# Patient Record
Sex: Female | Born: 1960 | Race: White | Hispanic: No | Marital: Single | Smoking: Former smoker
Health system: Southern US, Community
[De-identification: ages and names within clinical notes are randomized; demographics above are authoritative.]

## PROBLEM LIST (undated history)

## (undated) DIAGNOSIS — I739 Peripheral vascular disease, unspecified: Secondary | ICD-10-CM

## (undated) DIAGNOSIS — E119 Type 2 diabetes mellitus without complications: Secondary | ICD-10-CM

## (undated) DIAGNOSIS — I10 Essential (primary) hypertension: Secondary | ICD-10-CM

## (undated) HISTORY — PX: EYE SURGERY: SHX253

## (undated) HISTORY — DX: Peripheral vascular disease, unspecified: I73.9

## (undated) HISTORY — DX: Type 2 diabetes mellitus without complications: E11.9

## (undated) HISTORY — PX: ACHILLES TENDON REPAIR: SUR1153

---

## 2017-09-10 ENCOUNTER — Encounter: Payer: Self-pay | Admitting: Surgery

## 2017-10-08 ENCOUNTER — Ambulatory Visit (INDEPENDENT_AMBULATORY_CARE_PROVIDER_SITE_OTHER): Payer: No Typology Code available for payment source | Admitting: Vascular Surgery

## 2017-10-08 ENCOUNTER — Other Ambulatory Visit: Payer: Self-pay | Admitting: *Deleted

## 2017-10-08 ENCOUNTER — Encounter: Payer: Self-pay | Admitting: Vascular Surgery

## 2017-10-08 ENCOUNTER — Other Ambulatory Visit: Payer: Self-pay

## 2017-10-08 VITALS — BP 106/69 | HR 73 | Temp 97.6°F | Ht 65.0 in | Wt 221.4 lb

## 2017-10-08 DIAGNOSIS — I70219 Atherosclerosis of native arteries of extremities with intermittent claudication, unspecified extremity: Secondary | ICD-10-CM | POA: Insufficient documentation

## 2017-10-08 DIAGNOSIS — I83893 Varicose veins of bilateral lower extremities with other complications: Secondary | ICD-10-CM

## 2017-10-08 DIAGNOSIS — I872 Venous insufficiency (chronic) (peripheral): Secondary | ICD-10-CM

## 2017-10-08 NOTE — Progress Notes (Signed)
Requested by:  Kirstie PeriShah, Ashish, MD [address] [place], [place] [id number]  Reason for consultation: bilateral leg pain   History of Present Illness   Christina ObeyKathy Lamb is a 57 y.o. (04/20/1961) female who presents with chief complaint: bilateral leg pain.  Onset of symptom occurred possibly >1 year ago but worsening over the last few minutes.  Pain is described as "aching", severity 3-6/10, and associated with extended standing.  Patient has attempted to treat this pain with rest.  The patient has no rest pain symptoms also and no leg wounds/ulcers.  The patient has a family history of varicose veins.  She has never been pregnant or had lymphedema or a DVT.  She denies any venous ulceration in the past.  She does note dropping a can on the left shin recently.  Atherosclerotic risk factors include: IDDM, HTN, prior smoking  Past Medical History: IDDM Major depression Reactive airway HTN  Past Surgical History: none per patient    Social History   Socioeconomic History  . Marital status: Single    Spouse name: Not on file  . Number of children: Not on file  . Years of education: Not on file  . Highest education level: Not on file  Occupational History  . Not on file  Social Needs  . Financial resource strain: Not on file  . Food insecurity:    Worry: Not on file    Inability: Not on file  . Transportation needs:    Medical: Not on file    Non-medical: Not on file  Tobacco Use  . Smoking status: Former Smoker    Packs/day: 0.50    Years: 15.00    Pack years: 7.50    Types: Cigarettes    Last attempt to quit: 2018    Years since quitting: 1.4  . Smokeless tobacco: Never Used  Substance and Sexual Activity  . Alcohol use: Never    Frequency: Never  . Drug use: Not on file  . Sexual activity: Not on file  Lifestyle  . Physical activity:    Days per week: Not on file    Minutes per session: Not on file  . Stress: Not on file  Relationships  . Social connections:    Talks on  phone: Not on file    Gets together: Not on file    Attends religious service: Not on file    Active member of club or organization: Not on file    Attends meetings of clubs or organizations: Not on file    Relationship status: Not on file  . Intimate partner violence:    Fear of current or ex partner: Not on file    Emotionally abused: Not on file    Physically abused: Not on file    Forced sexual activity: Not on file  Other Topics Concern  . Not on file  Social History Narrative  . Not on file   Family History: patient is unable to detail the medical history of his parents   Current Outpatient Medications  Medication Sig Dispense Refill  . aspirin EC 81 MG tablet Take 81 mg by mouth daily.    Marland Kitchen. glimepiride (AMARYL) 4 MG tablet Take 1 tablet by mouth daily.    . INVOKAMET [phone] MG TABS Take 1 tablet by mouth 2 (two) times daily.  4  . lisinopril (PRINIVIL,ZESTRIL) 20 MG tablet Take 20 mg by mouth daily.     Marland Kitchen. omega-3 acid ethyl esters (LOVAZA) 1 g capsule  Take by mouth 2 (two) times daily.    Marland Kitchen albuterol (PROVENTIL HFA;VENTOLIN HFA) 108 (90 Base) MCG/ACT inhaler Inhale into the lungs every 6 (six) hours as needed for wheezing or shortness of breath.     No current facility-administered medications for this visit.     Allergies  Allergen Reactions  . Hydromet [Hydrocodone-Homatropine]   . Penicillins Swelling  . Codeine Nausea And Vomiting    REVIEW OF SYSTEMS (negative unless checked):   Cardiac:  []  Chest pain or chest pressure? []  Shortness of breath upon activity? []  Shortness of breath when lying flat? []  Irregular heart rhythm?  Vascular:  [x]  Pain in calf, thigh, or hip brought on by walking? [x]  Pain in feet at night that wakes you up from your sleep? []  Blood clot in your veins? [x]  Leg swelling?  Pulmonary:  []  Oxygen at home? []  Productive cough? []  Wheezing?  Neurologic:  []  Sudden weakness in arms or legs? []  Sudden numbness in arms or  legs? []  Sudden onset of difficult speaking or slurred speech? []  Temporary loss of vision in one eye? []  Problems with dizziness?  Gastrointestinal:  []  Blood in stool? []  Vomited blood?  Genitourinary:  []  Burning when urinating? []  Blood in urine?  Psychiatric:  []  Major depression  Hematologic:  []  Bleeding problems? []  Problems with blood clotting?  Dermatologic:  []  Rashes or ulcers?  Constitutional:  []  Fever or chills?  Ear/Nose/Throat:  []  Change in hearing? []  Nose bleeds? []  Sore throat?  Musculoskeletal:  []  Back pain? []  Joint pain? []  Muscle pain?   For VQI Use Only   PRE-ADM LIVING Home  AMB STATUS Ambulatory  CAD Sx None  PRIOR CHF None  STRESS TEST No   Physical Examination     Vitals:   10/08/17 0917 10/08/17 0920  BP: 104/62 106/69  Pulse: 73   Temp: 97.6 F (36.4 C)   TempSrc: Oral   SpO2: 98%   Weight: 221 lb 6.4 oz (100.4 kg)   Height: 5\' 5"  (1.651 m)    Body mass index is 36.84 kg/m.  General Alert, O x 3, WD, NAD  Head [place]/AT,    Ear/Nose/ Throat Hearing grossly intact, nares without erythema or drainage, oropharynx without Erythema or Exudate, Mallampati score: 3,   Eyes PERRLA, EOMI,    Neck Supple, mid-line trachea,    Pulmonary Sym exp, good B air movt, CTA B  Cardiac RRR, Nl S1, S2, no Murmurs, No rubs, No S3,S4  Vascular Vessel Right Left  Radial Palpable Palpable  Brachial Palpable Palpable  Carotid Palpable, No Bruit Palpable, No Bruit  Aorta Not palpable N/A  Femoral Palpable Palpable  Popliteal Not palpable Not palpable  PT Not palpable Not palpable  DP Not palpable Not palpable    Gastro- intestinal soft, non-distended, non-tender to palpation, No guarding or rebound, no HSM, no masses, no CVAT B, No palpable prominent aortic pulse,    Musculo- skeletal M/S 5/5 throughout  , Extremities without ischemic changes  , Non-pitting edema present: B 1+, circumferential L calf edema, Varicosities present:  R>L, nest of varicosities palpable posterior calf, Lipodermatosclerosis present: L>R  Neurologic Cranial nerves 2-12 intact , Pain and light touch intact in extremities , Motor exam as listed above  Psychiatric Judgement intact, Mood & affect appropriate for pt's clinical situation  Dermatologic See M/S exam for extremity exam, No rashes otherwise noted  Lymphatic  Palpable lymph nodes: None    Non-Invasive Vascular imaging   Outside arterial  duplex review: inadequate study, will need repeat studies   Outside Studies/Documentation   5 pages of outside documents were reviewed including: outpatient PCP chart, outside arterial duplex.   Medical Decision Making   Christina Lamb is a 57 y.o. female who presents with: likely moderate RLE PAD, likely moderate LLE PAD, CVI (C4), varicose veins with pain   This patient's sx are more consistent with CVI than PAD as she does not have classic intermittent claudication, though I suspect her ABI will fall into the range for such.  Her outside studies is inadequate for evaluating her PAD.  Based on this patient's history and physical exam, I recommend: BLE ABI, B venous reflux duplex  She will follow up with [place] after her studies are scheduled.  I discussed with the patient the natural history of intermittent claudication: 75% of patients have stable or improved symptoms in a year an only 2% require amputation. Eventually 20% may require intervention in a year.  I discussed in depth with the patient the nature of atherosclerosis, and emphasized the importance of maximal medical management including strict control of blood pressure, blood glucose, and lipid levels, antiplatelet agent, obtaining regular exercise, and cessation of smoking.    The patient is aware that without maximal medical management the underlying atherosclerotic disease process will progress, limiting the benefit of any interventions.  I discussed in depth with the patient a  walking plan and how to execute such.  The patient is currently not on on statin as not medically indicated.   The patient is currently on an anti-platelet: ASA.  Thank you for allowing [place] to participate in this patient's care.   Leonides Sake, MD, FACS [facility] Office: [phone] Pager: [phone]  10/08/2017, 9:43 AM

## 2017-10-10 ENCOUNTER — Encounter: Payer: Self-pay | Admitting: Internal Medicine

## 2017-12-16 ENCOUNTER — Encounter (HOSPITAL_COMMUNITY): Payer: No Typology Code available for payment source

## 2017-12-16 ENCOUNTER — Ambulatory Visit (HOSPITAL_COMMUNITY)
Admission: RE | Admit: 2017-12-16 | Discharge: 2017-12-16 | Disposition: A | Discharge status: Home / Self Care | Payer: PRIVATE HEALTH INSURANCE | Source: Ambulatory Visit | Attending: Vascular Surgery | Admitting: Vascular Surgery

## 2017-12-16 DIAGNOSIS — I781 Nevus, non-neoplastic: Secondary | ICD-10-CM | POA: Insufficient documentation

## 2017-12-16 DIAGNOSIS — I872 Venous insufficiency (chronic) (peripheral): Secondary | ICD-10-CM | POA: Diagnosis present

## 2017-12-16 DIAGNOSIS — I70219 Atherosclerosis of native arteries of extremities with intermittent claudication, unspecified extremity: Secondary | ICD-10-CM

## 2017-12-17 ENCOUNTER — Encounter (HOSPITAL_COMMUNITY): Payer: No Typology Code available for payment source

## 2017-12-17 ENCOUNTER — Ambulatory Visit: Payer: No Typology Code available for payment source | Admitting: Vascular Surgery

## 2017-12-24 ENCOUNTER — Ambulatory Visit: Payer: No Typology Code available for payment source

## 2017-12-24 ENCOUNTER — Encounter (HOSPITAL_COMMUNITY): Payer: No Typology Code available for payment source

## 2017-12-26 ENCOUNTER — Encounter (HOSPITAL_COMMUNITY): Payer: No Typology Code available for payment source

## 2017-12-26 ENCOUNTER — Ambulatory Visit: Payer: No Typology Code available for payment source

## 2018-01-02 ENCOUNTER — Ambulatory Visit: Payer: No Typology Code available for payment source | Admitting: Family

## 2018-01-02 ENCOUNTER — Ambulatory Visit (HOSPITAL_COMMUNITY): Payer: No Typology Code available for payment source

## 2018-01-27 ENCOUNTER — Encounter: Payer: Self-pay | Admitting: Family

## 2018-01-27 ENCOUNTER — Ambulatory Visit (INDEPENDENT_AMBULATORY_CARE_PROVIDER_SITE_OTHER): Payer: No Typology Code available for payment source | Admitting: Family

## 2018-01-27 ENCOUNTER — Ambulatory Visit (HOSPITAL_COMMUNITY)
Admission: RE | Admit: 2018-01-27 | Discharge: 2018-01-27 | Disposition: A | Discharge status: Home / Self Care | Payer: No Typology Code available for payment source | Source: Ambulatory Visit | Attending: Family | Admitting: Family

## 2018-01-27 ENCOUNTER — Other Ambulatory Visit: Payer: Self-pay

## 2018-01-27 VITALS — BP 108/69 | HR 76 | Temp 97.7°F | Resp 16 | Ht 63.0 in | Wt 215.0 lb

## 2018-01-27 DIAGNOSIS — I70219 Atherosclerosis of native arteries of extremities with intermittent claudication, unspecified extremity: Secondary | ICD-10-CM | POA: Diagnosis not present

## 2018-01-27 DIAGNOSIS — I779 Disorder of arteries and arterioles, unspecified: Secondary | ICD-10-CM | POA: Diagnosis not present

## 2018-01-27 NOTE — Progress Notes (Signed)
[facility]    [facility]: Follow up peripheral artery occlusive disease  History of Present Illness Christina Lamb is a 57 y.o. female who was referred to Dr. Imogene Burn by Dr. Kirstie Peri for bilateral leg pain.   Dr. Nicky Pugh initial evaluation was on 10-08-17. Onset of symptom occurred possibly before June of 2018, but worsening.  Pain was described as "aching", severity 3-6/10, and associated with extended standing.  Patient has attempted to treat this pain with rest.  The patient has no rest pain symptoms also and no leg wounds/ulcers.  The patient has a family history of varicose veins.  She has never been pregnant or had lymphedema or a DVT.  She denies any venous ulceration in the past.  She does note dropping a can on the left shin recently. Dr. Nicky Pugh assessment and recommendations were as follows: likely moderate RLE PAD, likely moderate LLE PAD, CVI (C4), varicose veins with pain. This patient's sx are more consistent with CVI than PAD as she does not have classic intermittent claudication, though Dr. Imogene Burn suspected her ABI will fall into the range for such.  Her outside studies were inadequate for evaluating of PAD.  Based on this patient's history and physical exam, Dr. Imogene Burn recommend: BLE ABI, B venous reflux duplex.  Pt returns today for follow up.  Atherosclerotic risk factors include: NIDDM, HTN, prior smoking  Pt states that about 2012 a 2 liter bottle plastic bottle fell on her left lower leg, and she has had swelling in this lower leg since then, but the swelling decreases by morning.   She denies any known history of stroke or TIA.  She denies any known cardiac problems.   She walks at least 6 hours/day, works for [employer] [place].  Pt denies any pain or weakness in her right leg with walking, her left lower leg feels tired after about 2 hours of walking.  Pt states her left lower leg has looked the same since she injured it in 2012. She had an  ulcer at the lateral base on her right 5th metatarsal which has healed, she continues to protect this with a bandage and wearing a post op shoe.     Diabetic: Yes, no lab results on file, pt states her last A1C was 5.8; pt states she has lost 65 pounds with walking and diet Tobacco use: former smoker, quit in 2018, smoked x 15 years  Pt meds include: Statin :No, pt states her cholesterol is ok Betablocker: No ASA: Yes Other anticoagulants/antiplatelets: no  Past Medical History:  Diagnosis Date  . Diabetes mellitus without complication [facility])     Social History Social History   Tobacco Use  . Smoking status: Former Smoker    Packs/day: 0.50    Years: 15.00    Pack years: 7.50    Types: Cigarettes    Last attempt to quit: 2018    Years since quitting: 1.7  . Smokeless tobacco: Never Used  Substance Use Topics  . Alcohol use: Never    Frequency: Never  . Drug use: Not on file    Family History History reviewed. No pertinent family history.  History reviewed. No pertinent surgical history.  Allergies  Allergen Reactions  . Hydromet [Hydrocodone-Homatropine]   . Penicillins Swelling  . Codeine Nausea And Vomiting    Current Outpatient Medications  Medication Sig Dispense Refill  . albuterol (PROVENTIL HFA;VENTOLIN HFA) 108 (90 Base) MCG/ACT inhaler Inhale into the lungs every 6 (six) hours  as needed for wheezing or shortness of breath.    Marland Kitchen. aspirin EC 81 MG tablet Take 81 mg by mouth daily.    Marland Kitchen. glimepiride (AMARYL) 4 MG tablet Take 1 tablet by mouth daily.    . INVOKAMET [phone] MG TABS Take 1 tablet by mouth 2 (two) times daily.  4  . lisinopril (PRINIVIL,ZESTRIL) 20 MG tablet Take 20 mg by mouth daily.     Marland Kitchen. omega-3 acid ethyl esters (LOVAZA) 1 g capsule Take by mouth 2 (two) times daily.     No current facility-administered medications for this visit.     ROS: See HPI for pertinent positives and negatives.   Physical Examination  Vitals:   01/27/18  0851  BP: 108/69  Pulse: 76  Resp: 16  Temp: 97.7 F (36.5 C)  TempSrc: Oral  SpO2: 97%  Weight: 215 lb (97.5 kg)  Height: 5\' 3"  (1.6 m)   Body mass index is 38.09 kg/m.  General: A&O x 3, WDWN, obese female. Gait: normal HENT: No gross abnormalities.  Eyes: PERRLA. Pulmonary: Respirations are non labored, CTAB, good air movement in all fields Cardiac: regular rhythm, no detected murmur.         Carotid Bruits Right Left   Negative Negative   Radial pulses are 1+ right, 2+ left palpable   Adominal aortic pulse is not palpable                         VASCULAR EXAM: Extremities with ischemic changes: right toe tips are cooler than left and slightly pale, without Gangrene; without open wounds. Bilateral anterior lower legs with mild erythema (left more so than right); left lower leg with 1+ non pitting edema.                                                                                                           LE Pulses Right Left       FEMORAL  1+ palpable  1+ palpable        POPLITEAL  not palpable   not palpable       POSTERIOR TIBIAL  not palpable   faintly palpable        DORSALIS PEDIS      ANTERIOR TIBIAL not palpable  not palpable    Abdomen: soft, NT, no palpable masses. Large soft panus.  Skin: no rashes, no cellulitis, no ulcers noted. Musculoskeletal: no muscle wasting or atrophy.  Neurologic: A&O X 3; appropriate affect, Sensation is normal; MOTOR FUNCTION:  moving all extremities equally, motor strength 5/5 throughout. Speech is fluent/normal. CN 2-12 intact. Psychiatric: Thought content is normal, mood appropriate for clinical situation.     ASSESSMENT: Christina Lamb is a 57 y.o. female who has no venous reflux according to venous reflux study on 12-16-17, does have 1+ non pitting edema in her left lower leg, and has bilateral mild erythema in both lower legs in a gaiter pattern, no ulcers.  ABI's indicate moderate peripheral artery occlusive  disease in the right, mild  in the left. 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  Her right toes are cooler and pale compared to her left toes.  There are no ulcers, no gangrene.   She has lost 65 pounds with walking and diet, and her A1C decreased to 5.8 She smoked for 15 years, quit in 2018.    DATA  Bilateral Venous Duplex (12-16-17): Right Reflux Technical Findings: No evidence of DVT, SVT, or Baker's cyst. The sapheno-femoral junction is competent. No evidence of GSV reflux. No evidence of SSV reflux. No evidence of deep venous reflux. Left Reflux Technical Findings: No evidence of DVT, SVT, or Baker's cyst. The sapheno-femoral junction is competent. No evidence of GSV reflux. No evidence of SSV reflux. No evidence of deep venous reflux.  Final Interpretation: Right: No reflux was noted in the common femoral vein, femoral vein in the thigh, popliteal vein, great saphenous vein at the saphenofemoral junction, great saphenous vein at the proximal thigh, great saphenous vein at the mid thigh, great saphenous vein at the distal thigh, great saphenous vein at the knee, origin of the small saphenous vein, and proximal small saphenous vein. There is no evidence of deep vein thrombosis in the lower extremity. There is no evidence of superficial venous thrombosis. No  cystic structure found in the popliteal fossa. Left: No reflux was noted in the common femoral vein , femoral vein in the thigh, popliteal vein, great saphenous vein at the saphenofemoral junction, great saphenous vein at the proximal thigh, great saphenous vein at the mid thigh, great saphenous vein at the distal thigh, great saphenous vein at the knee, origin of the small saphenous vein, and proximal small saphenous vein. There is no evidence of deep vein thrombosis in the lower extremity. There is no evidence of superficial venous thrombosis. No  cystic  structure found in the popliteal fossa.   ABI (Date: 01/27/2018):  R:   ABI: 0.59 (no previous for comparison),   PT: mono  DP: mono  TBI:  0.32, toe pressure 35  L:   ABI: 0.87 (no previous),   PT: tri  DP: mono  TBI: unable to detect due to low amplitude Moderate disease in the right with monophasic waveforms Mild disease in the left with tri and monophasic waveforms.    PLAN:  BMI is 38: Continue extensive walking and dietary measure for continued weight loss.   Based on the patient's vascular studies and examination, pt will return to clinic in 6 months with ABI's. I advised to notify us if she develops concerns re the circulation in her feet or legs.   I discussed in depth with the patient the nature of atherosclerosis, and emphasized the importance of maximal medical management including strict control of blood pressure, blood glucose, and lipid levels, obtaining regular exercise, and continued cessation of smoking.  The patient is aware that without maximal medical management the underlying atherosclerotic disease process will progress, limiting the benefit of any interventions.  The patient was given information about PAD including signs, symptoms, treatment, what symptoms should prompt the patient to seek immediate medical care, and risk reduction measures to take.  Charisse March, RN, MSN, FNP-C [facility] Phone: [phone]  Clinic MD: Bonnye Fava  01/27/18 8:57 AM

## 2018-01-27 NOTE — Patient Instructions (Addendum)
  To decrease swelling in your feet and legs: Elevate feet above slightly bent knees, feet above heart, overnight and 3-4 times per day for 20 minutes.   To measure for knee high compression hose: Measure the length of calf (from the crease of the knee to the bottom of the heel), largest circumference of calf, and ankle circumference first thing in the morning before your legs have a chance to swell.  Take these 3 measurements with you to obtain 20-30 mm mercury graduated knee high compression hose.  Put the stockings on in the morning, remove at bedtime.     Peripheral Vascular Disease Peripheral vascular disease (PVD) is a disease of the blood vessels that are not part of your heart and brain. A simple term for PVD is poor circulation. In most cases, PVD narrows the blood vessels that carry blood from your heart to the rest of your body. This can result in a decreased supply of blood to your arms, legs, and internal organs, like your stomach or kidneys. However, it most often affects a person's lower legs and feet. There are two types of PVD.  Organic PVD. This is the more common type. It is caused by damage to the structure of blood vessels.  Functional PVD. This is caused by conditions that make blood vessels contract and tighten (spasm).  Without treatment, PVD tends to get worse over time. PVD can also lead to acute ischemic limb. This is when an arm or limb suddenly has trouble getting enough blood. This is a medical emergency. Follow these instructions at home:  Take medicines only as told by your doctor.  Do not use any tobacco products, including cigarettes, chewing tobacco, or electronic cigarettes. If you need help quitting, ask your doctor.  Lose weight if you are overweight, and maintain a healthy weight as told by your doctor.  Eat a diet that is low in fat and cholesterol. If you need help, ask your doctor.  Exercise regularly. Ask your doctor for some good activities  for you.  Take good care of your feet. ? Wear comfortable shoes that fit well. ? Check your feet often for any cuts or sores. Contact a doctor if:  You have cramps in your legs while walking.  You have leg pain when you are at rest.  You have coldness in a leg or foot.  Your skin changes.  You are unable to get or have an erection (erectile dysfunction).  You have cuts or sores on your feet that are not healing. Get help right away if:  Your arm or leg turns cold and blue.  Your arms or legs become red, warm, swollen, painful, or numb.  You have chest pain or trouble breathing.  You suddenly have weakness in your face, arm, or leg.  You become very confused or you cannot speak.  You suddenly have a very bad headache.  You suddenly cannot see. This information is not intended to replace advice given to you by your health care provider. Make sure you discuss any questions you have with your health care provider. Document Released: 07/17/2009 Document Revised: 09/28/2015 Document Reviewed: 09/30/2013 [employer]vier Interactive Patient Education  2017 [employer]  

## 2018-07-21 ENCOUNTER — Encounter (HOSPITAL_COMMUNITY): Payer: No Typology Code available for payment source

## 2018-07-21 ENCOUNTER — Ambulatory Visit: Payer: No Typology Code available for payment source | Admitting: Family

## 2020-04-13 ENCOUNTER — Other Ambulatory Visit: Payer: Self-pay | Admitting: Podiatry

## 2020-04-13 DIAGNOSIS — E1151 Type 2 diabetes mellitus with diabetic peripheral angiopathy without gangrene: Secondary | ICD-10-CM

## 2020-04-13 DIAGNOSIS — L97512 Non-pressure chronic ulcer of other part of right foot with fat layer exposed: Secondary | ICD-10-CM

## 2020-04-20 ENCOUNTER — Ambulatory Visit (HOSPITAL_COMMUNITY)
Admission: RE | Admit: 2020-04-20 | Discharge: 2020-04-20 | Disposition: A | Discharge status: Home / Self Care | Payer: No Typology Code available for payment source | Source: Ambulatory Visit | Attending: Podiatry | Admitting: Podiatry

## 2020-04-20 ENCOUNTER — Other Ambulatory Visit: Payer: Self-pay

## 2020-04-20 DIAGNOSIS — L97512 Non-pressure chronic ulcer of other part of right foot with fat layer exposed: Secondary | ICD-10-CM | POA: Diagnosis not present

## 2020-04-20 DIAGNOSIS — E1151 Type 2 diabetes mellitus with diabetic peripheral angiopathy without gangrene: Secondary | ICD-10-CM | POA: Insufficient documentation

## 2020-04-24 ENCOUNTER — Other Ambulatory Visit (HOSPITAL_COMMUNITY): Payer: Self-pay | Admitting: Podiatry

## 2020-04-24 ENCOUNTER — Other Ambulatory Visit: Payer: Self-pay

## 2020-04-24 DIAGNOSIS — M19071 Primary osteoarthritis, right ankle and foot: Secondary | ICD-10-CM

## 2020-04-24 DIAGNOSIS — I70219 Atherosclerosis of native arteries of extremities with intermittent claudication, unspecified extremity: Secondary | ICD-10-CM

## 2020-04-25 ENCOUNTER — Other Ambulatory Visit: Payer: Self-pay | Admitting: *Deleted

## 2020-04-25 ENCOUNTER — Encounter: Payer: Self-pay | Admitting: Vascular Surgery

## 2020-04-25 ENCOUNTER — Inpatient Hospital Stay (HOSPITAL_COMMUNITY): Admission: RE | Admit: 2020-04-25 | Payer: No Typology Code available for payment source | Source: Ambulatory Visit

## 2020-04-25 ENCOUNTER — Ambulatory Visit (INDEPENDENT_AMBULATORY_CARE_PROVIDER_SITE_OTHER): Payer: No Typology Code available for payment source | Admitting: Vascular Surgery

## 2020-04-25 ENCOUNTER — Other Ambulatory Visit: Payer: Self-pay

## 2020-04-25 ENCOUNTER — Ambulatory Visit (HOSPITAL_COMMUNITY)
Admission: RE | Admit: 2020-04-25 | Discharge: 2020-04-25 | Disposition: A | Discharge status: Home / Self Care | Payer: No Typology Code available for payment source | Source: Ambulatory Visit | Attending: Vascular Surgery | Admitting: Vascular Surgery

## 2020-04-25 ENCOUNTER — Encounter: Payer: Self-pay | Admitting: *Deleted

## 2020-04-25 DIAGNOSIS — I70219 Atherosclerosis of native arteries of extremities with intermittent claudication, unspecified extremity: Secondary | ICD-10-CM | POA: Diagnosis not present

## 2020-04-25 DIAGNOSIS — I739 Peripheral vascular disease, unspecified: Secondary | ICD-10-CM | POA: Diagnosis not present

## 2020-04-25 NOTE — Progress Notes (Signed)
Patient name: Christina Lamb MRN: [id number] DOB: [birth year] Sex: female  REASON FOR CONSULT: Right foot ulcer  HPI: Christina Lamb is a 59 y.o. female, with diabetes that presents for evaluation of PAD and right foot ulcer.  She has had an ulcer on the right foot for approximately 20 weeks.  She states she has been to a number of different doctors including wound clinics and this has failed to heal with multiple treatments.  She states she had wounds on her left foot that have healed immediately.  She denies any previous lower extremity revascularizations.  She used to smoke but has since quit.  She is ambulatory.  She is currently walking with a Darco shoe and states she has a lot of pain in the foot when she is walking.  Past Medical History:  Diagnosis Date  . Diabetes mellitus without complication [facility])     Past Surgical History:  Procedure Laterality Date  . ACHILLES TENDON REPAIR      Family History  Problem Relation Age of Onset  . Heart disease Father     SOCIAL HISTORY: Social History   Socioeconomic History  . Marital status: Single    Spouse name: Not on file  . Number of children: Not on file  . Years of education: Not on file  . Highest education level: Not on file  Occupational History  . Not on file  Tobacco Use  . Smoking status: Former Smoker    Packs/day: 0.50    Years: 15.00    Pack years: 7.50    Types: Cigarettes    Quit date: 2018    Years since quitting: 3.9  . Smokeless tobacco: Never Used  Substance and Sexual Activity  . Alcohol use: Never  . Drug use: Never  . Sexual activity: Not on file  Other Topics Concern  . Not on file  Social History Narrative  . Not on file   Social Determinants of Health   Financial Resource Strain: Not on file  Food Insecurity: Not on file  Transportation Needs: Not on file  Physical Activity: Not on file  Stress: Not on file  Social Connections: Not on file  Intimate Partner Violence: Not on file     Allergies  Allergen Reactions  . Hydromet [Hydrocodone-Homatropine]   . Penicillins Swelling  . Codeine Nausea And Vomiting    Current Outpatient Medications  Medication Sig Dispense Refill  . albuterol (PROVENTIL HFA;VENTOLIN HFA) 108 (90 Base) MCG/ACT inhaler Inhale into the lungs every 6 (six) hours as needed for wheezing or shortness of breath.    Marland Kitchen aspirin EC 81 MG tablet Take 81 mg by mouth daily.    Marland Kitchen glimepiride (AMARYL) 4 MG tablet Take 1 tablet by mouth daily.    . INVOKAMET [phone] MG TABS Take 1 tablet by mouth 2 (two) times daily.  4  . lisinopril (PRINIVIL,ZESTRIL) 20 MG tablet Take 20 mg by mouth daily.     Marland Kitchen omega-3 acid ethyl esters (LOVAZA) 1 g capsule Take by mouth 2 (two) times daily.     No current facility-administered medications for this visit.    REVIEW OF SYSTEMS:  [X]  denotes positive finding, [ ]  denotes negative finding Cardiac  Comments:  Chest pain or chest pressure:    Shortness of breath upon exertion:    Short of breath when lying flat:    Irregular heart rhythm:        Vascular    Pain in calf, thigh, or hip  brought on by ambulation:    Pain in feet at night that wakes you up from your sleep:     Blood clot in your veins:    Leg swelling:         Pulmonary    Oxygen at home:    Productive cough:     Wheezing:         Neurologic    Sudden weakness in arms or legs:     Sudden numbness in arms or legs:     Sudden onset of difficulty speaking or slurred speech:    Temporary loss of vision in one eye:     Problems with dizziness:         Gastrointestinal    Blood in stool:     Vomited blood:         Genitourinary    Burning when urinating:     Blood in urine:        Psychiatric    Major depression:         Hematologic    Bleeding problems:    Problems with blood clotting too easily:        Skin    Rashes or ulcers:        Constitutional    Fever or chills:      PHYSICAL EXAM: Vitals:   04/25/20 1529  BP: 104/69   Pulse: 88  Resp: 16  Temp: 97.8 F (36.6 C)  TempSrc: Temporal  SpO2: 95%  Weight: 217 lb (98.4 kg)  Height: 5' 4.5" (1.638 m)    GENERAL: The patient is a well-nourished female, in no acute distress. The vital signs are documented above. CARDIAC: There is a regular rate and rhythm.  VASCULAR:  Palpable femoral pulses bilaterally Left DP palpable Right pedal pulses nonpalpable Right fifth metatarsal ulcer as pictured below PULMONARY: No respiratory distress. ABDOMEN: Soft and non-tender. MUSCULOSKELETAL: There are no major deformities or cyanosis. NEUROLOGIC: No focal weakness or paresthesias are detected. SKIN: There are no ulcers or rashes noted. PSYCHIATRIC: The patient has a normal affect.      DATA:   ABIs today are 0.51 on the right monophasic with a toe pressure of 0.  On the left her ABIs 0.75 and triphasic at the ankle.  Assessment/Plan:  59 year old female presents with critical limb ischemia of the right lower extremity with tissue loss.  She has an ulcer on the right fifth metatarsal head now for 20 weeks.  Discussed with her that she has severely depressed ABI of 0.5 with a monophasic waveform and a toe pressure of 0 which is certainly inadequate for wound healing.  I discussed that she will need aortogram lower extremity arteriogram and possible intervention.  Previous arterial duplex in 2019 did show a right SFA occlusion and we discussed that she may ultimately require bypass at a later date.  I am out of town next week and offered to get her scheduled with one of my partners for arteriogram but she wants to wait until the week after when I have returned.  I will get her scheduled today.  Risks and benefits of arteriogram were discussed in detail.   Cephus Shelling, MD [facility] [place] Office: [phone]

## 2020-05-08 ENCOUNTER — Ambulatory Visit (HOSPITAL_COMMUNITY)
Admission: RE | Admit: 2020-05-08 | Discharge: 2020-05-08 | Disposition: A | Discharge status: Home / Self Care | Payer: No Typology Code available for payment source | Source: Ambulatory Visit | Attending: Podiatry | Admitting: Podiatry

## 2020-05-08 ENCOUNTER — Other Ambulatory Visit: Payer: Self-pay

## 2020-05-08 DIAGNOSIS — M19071 Primary osteoarthritis, right ankle and foot: Secondary | ICD-10-CM | POA: Diagnosis not present

## 2020-05-08 IMAGING — MR MR FOOT*R* WO/W CM
8 series · 40 of 40 positions shown · IV contrast (gadavist)
Comparison: None available.

CLINICAL DATA: Right lateral foot pain for 3 months. No known
injury.

EXAM:
MRI OF THE RIGHT FOREFOOT WITHOUT AND WITH CONTRAST
TECHNIQUE: Multiplanar, multisequence MR imaging of the right forefoot was
performed before and after the administration of intravenous
contrast.
CONTRAST:  10mL GADAVIST GADOBUTROL 1 MMOL/ML IV SOLN

[Series 4: T2 fat-sat · coronal · right · 3.0mm · 0.38mm/px · 5 of 36 slices shown (1 of 2)]
[im 1/36]
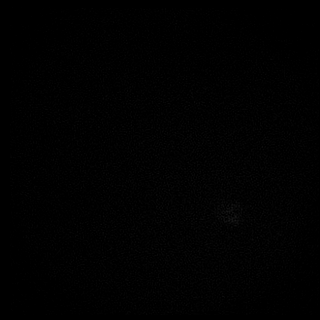
[im 9/36]
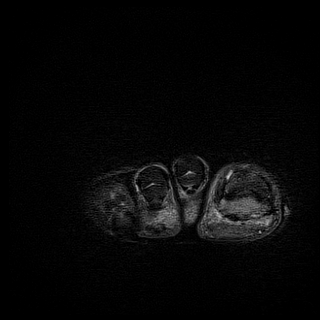
[im 18/36]
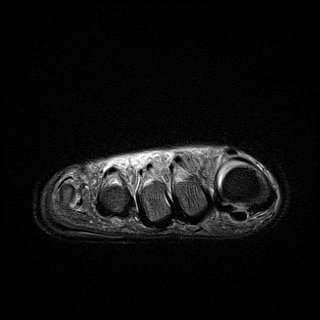
[im 27/36]
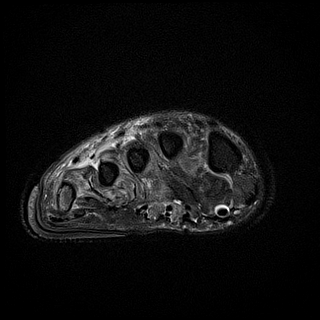
[im 36/36]
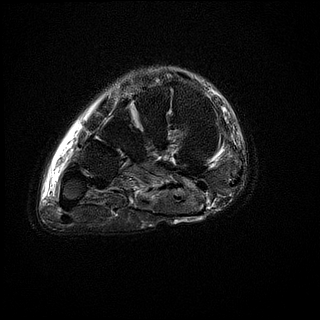

[Series 5: T1 · axial · right · 3.0mm · 0.62mm/px · z∈[-59,+24]mm · 4 of 22 slices shown (1 of 3)]
[im 1/22]
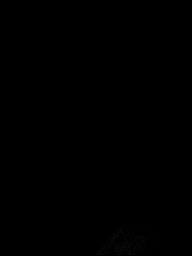
[im 8/22]
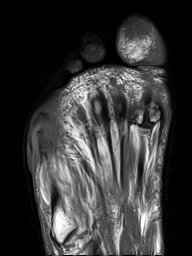
[im 15/22]
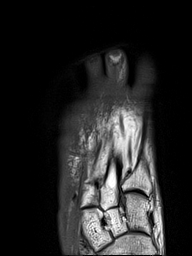
[im 22/22]
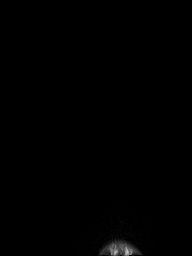

[Series 6: T2 fat-sat · axial · right · 3.0mm · 0.62mm/px · z∈[-60,+22]mm · 4 of 22 slices shown (2 of 2)]
[im 1/22]
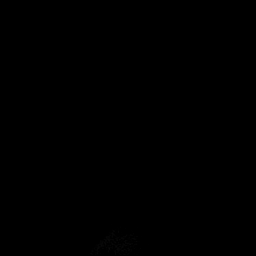
[im 8/22]
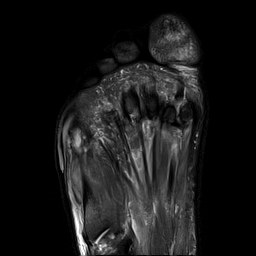
[im 15/22]
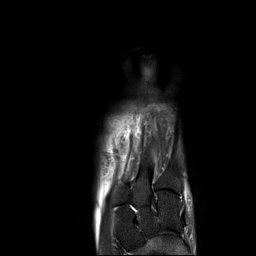
[im 22/22]
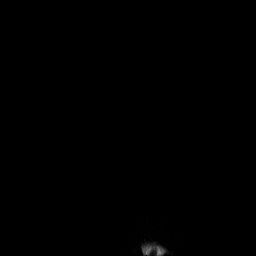

[Series 7: STIR · sagittal · right · 3.0mm · 0.62mm/px · 6 of 33 slices shown]
[im 1/33]
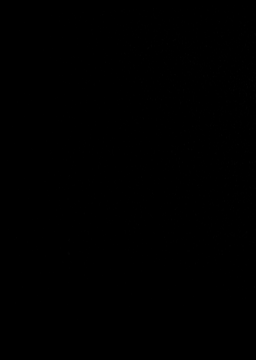
[im 7/33]
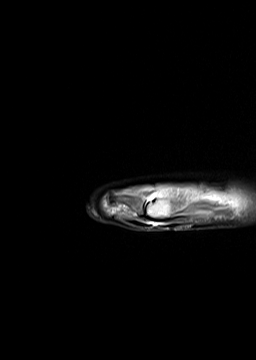
[im 13/33]
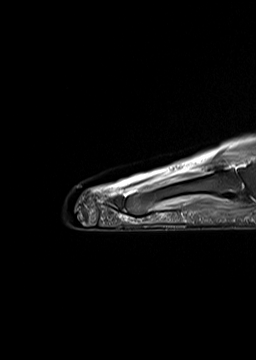
[im 20/33]
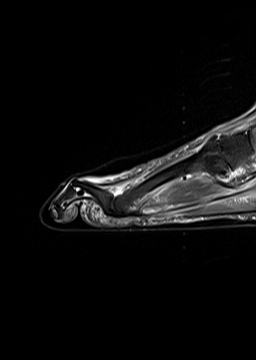
[im 26/33]
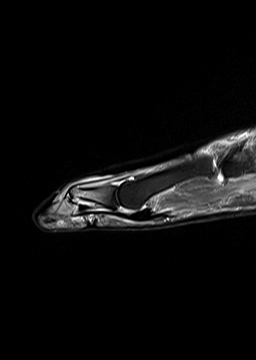
[im 33/33]
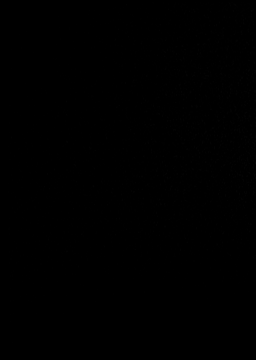

[Series 8: T1 fat-sat · coronal · non-contrast · right · 3.0mm · 0.59mm/px · 6 of 36 slices shown]
[im 1/36]
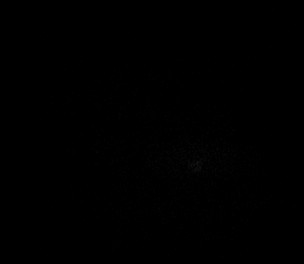
[im 8/36]
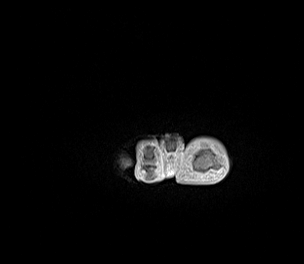
[im 15/36]
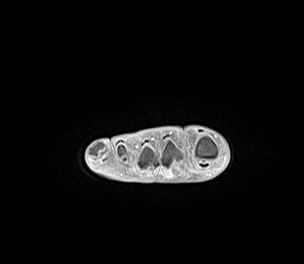
[im 22/36]
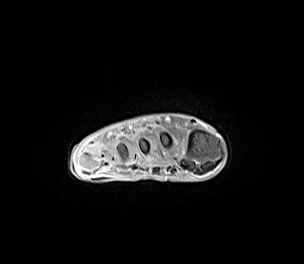
[im 29/36]
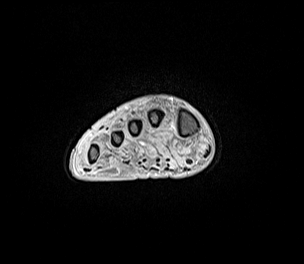
[im 36/36]
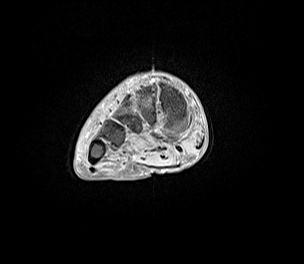

[Series 9: T1 fat-sat post-contrast · coronal · right · 3.0mm · 0.59mm/px · 6 of 36 slices shown]
[im 1/36]
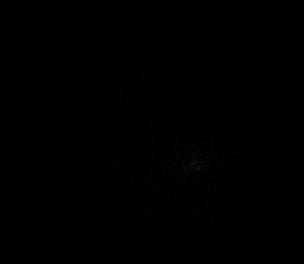
[im 8/36]
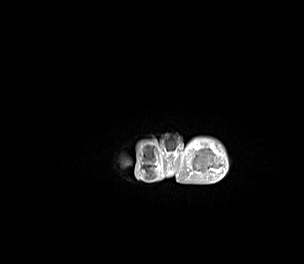
[im 15/36]
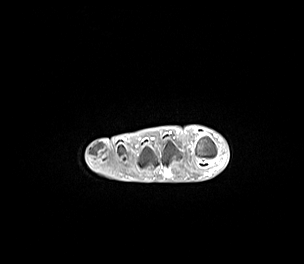
[im 22/36]
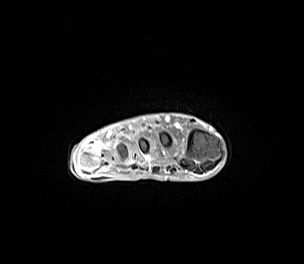
[im 29/36]
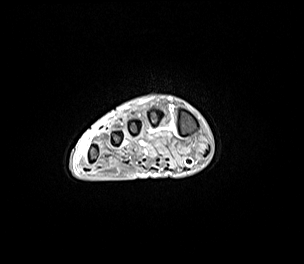
[im 36/36]
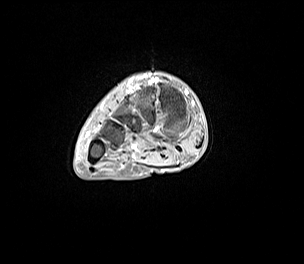

[Series 1001: T1 · axial · right · 3.0mm · 0.75mm/px · z∈[-70,+12]mm · 4 of 22 slices shown (2 of 3)]
[im 1/22]
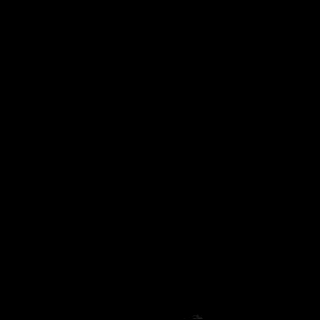
[im 8/22]
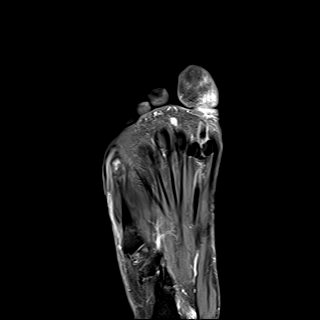
[im 15/22]
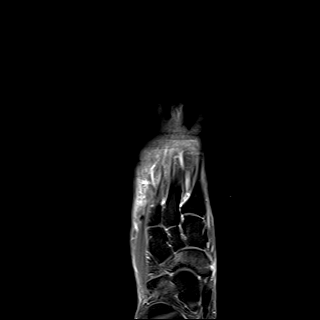
[im 22/22]
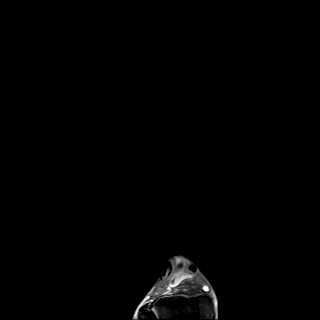

[Series 1013: T1 · sagittal · right · 3.0mm · 0.79mm/px · 5 of 31 slices shown (3 of 3)]
[im 1/31]
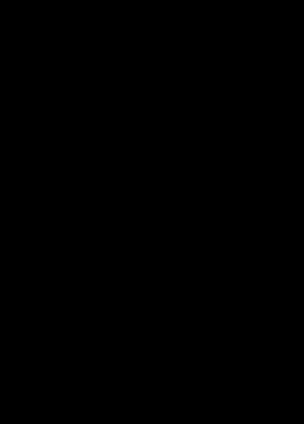
[im 8/31]
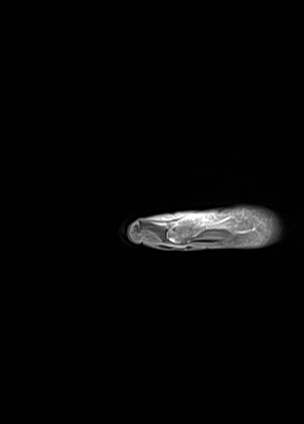
[im 16/31]
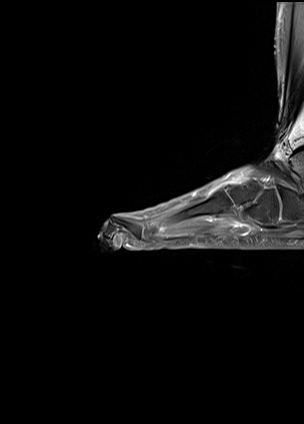
[im 23/31]
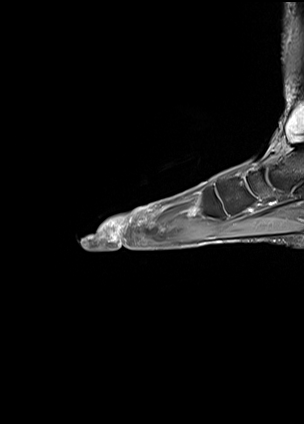
[im 31/31]
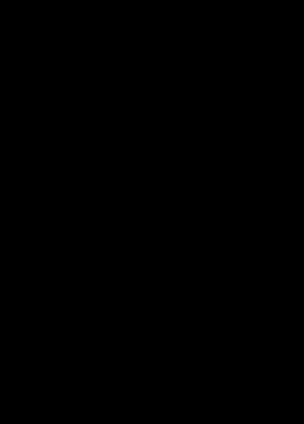

[40 of 40 positions shown; findings below may reference images not displayed]

FINDINGS: Bones/Joint/Cartilage

Superficial ulceration underlying the plantar aspect of the fifth
metatarsal head (series 4, image 22). Extensive bone marrow edema
within the fifth metatarsal head and neck with associated low T1
marrow signal changes (series 5 and 6, image 13). Small fifth MTP
joint effusion. Subtle marrow edema within the base of the fifth toe
proximal phalanx without associated abnormal T1 marrow signal.

Degenerative changes of the great toe IP joint with slight plantar
subluxation. Mild marrow edema within the great toe distal phalanx
and distal aspect of the proximal phalanx (series 7, image 26)
favored to represent reactive/stress related changes.

Flexion deformities of the lesser toes. No acute fracture or
dislocation.

Ligaments

Intact Lisfranc ligament. Collateral ligaments of the forefoot
appear intact.

Muscles and Tendons

No tenosynovial fluid collections. Mild fatty infiltration of the
intrinsic foot musculature.

Soft tissues

No organized or rim enhancing fluid collections. Mild dorsal
subcutaneous edema at the lateral aspect of the forefoot.
IMPRESSION: 1. Superficial ulceration underlying the plantar aspect of the right
fifth metatarsal head with associated acute osteomyelitis of the
fifth metatarsal head and neck.
2. Subtle marrow edema within the base of the fifth toe proximal
phalanx without associated abnormal T1 marrow signal. Findings may
represent reactive osteitis, although early acute osteomyelitis is
not excluded.
3. Small fifth MTP joint effusion, which may be reactive or reflect
septic arthritis.
4. Degenerative changes of the great toe IP joint with slight
plantar subluxation. Mild marrow edema within the great toe distal
phalanx and distal aspect of the proximal phalanx favored to
represent reactive/stress related changes.

These results will be called to the ordering clinician or
representative by the Radiologist Assistant, and communication
documented in the PACS or [REDACTED].

## 2020-05-08 MED ORDER — GADOBUTROL 1 MMOL/ML IV SOLN
10.0000 mL | Freq: Once | INTRAVENOUS | Status: AC | PRN
  Administered 2020-05-08: 10 mL via INTRAVENOUS

## 2020-05-09 ENCOUNTER — Other Ambulatory Visit (HOSPITAL_COMMUNITY)
Admission: RE | Admit: 2020-05-09 | Discharge: 2020-05-09 | Disposition: A | Discharge status: Home / Self Care | Payer: No Typology Code available for payment source | Source: Ambulatory Visit | Attending: Vascular Surgery | Admitting: Vascular Surgery

## 2020-05-09 DIAGNOSIS — Z20822 Contact with and (suspected) exposure to covid-19: Secondary | ICD-10-CM | POA: Diagnosis not present

## 2020-05-09 DIAGNOSIS — Z01812 Encounter for preprocedural laboratory examination: Secondary | ICD-10-CM | POA: Diagnosis present

## 2020-05-09 LAB — SARS CORONAVIRUS 2 (TAT 6-24 HRS): SARS Coronavirus 2: NEGATIVE

## 2020-05-11 ENCOUNTER — Ambulatory Visit (HOSPITAL_BASED_OUTPATIENT_CLINIC_OR_DEPARTMENT_OTHER): Payer: No Typology Code available for payment source

## 2020-05-11 ENCOUNTER — Encounter (HOSPITAL_COMMUNITY)
Admission: RE | Disposition: A | Discharge status: Home / Self Care | Payer: Self-pay | Source: Home / Self Care | Attending: Vascular Surgery

## 2020-05-11 ENCOUNTER — Ambulatory Visit (HOSPITAL_COMMUNITY)
Admission: RE | Admit: 2020-05-11 | Discharge: 2020-05-11 | Disposition: A | Discharge status: Home / Self Care | Payer: No Typology Code available for payment source | Attending: Vascular Surgery | Admitting: Vascular Surgery

## 2020-05-11 DIAGNOSIS — E1151 Type 2 diabetes mellitus with diabetic peripheral angiopathy without gangrene: Secondary | ICD-10-CM | POA: Insufficient documentation

## 2020-05-11 DIAGNOSIS — I70235 Atherosclerosis of native arteries of right leg with ulceration of other part of foot: Secondary | ICD-10-CM | POA: Insufficient documentation

## 2020-05-11 DIAGNOSIS — E11621 Type 2 diabetes mellitus with foot ulcer: Secondary | ICD-10-CM | POA: Insufficient documentation

## 2020-05-11 DIAGNOSIS — Z79899 Other long term (current) drug therapy: Secondary | ICD-10-CM | POA: Insufficient documentation

## 2020-05-11 DIAGNOSIS — L97519 Non-pressure chronic ulcer of other part of right foot with unspecified severity: Secondary | ICD-10-CM | POA: Diagnosis not present

## 2020-05-11 DIAGNOSIS — Z885 Allergy status to narcotic agent status: Secondary | ICD-10-CM | POA: Diagnosis not present

## 2020-05-11 DIAGNOSIS — Z01810 Encounter for preprocedural cardiovascular examination: Secondary | ICD-10-CM | POA: Diagnosis not present

## 2020-05-11 DIAGNOSIS — Z7982 Long term (current) use of aspirin: Secondary | ICD-10-CM | POA: Insufficient documentation

## 2020-05-11 DIAGNOSIS — Z87891 Personal history of nicotine dependence: Secondary | ICD-10-CM | POA: Diagnosis not present

## 2020-05-11 DIAGNOSIS — Z880 Allergy status to penicillin: Secondary | ICD-10-CM | POA: Insufficient documentation

## 2020-05-11 DIAGNOSIS — Z7984 Long term (current) use of oral hypoglycemic drugs: Secondary | ICD-10-CM | POA: Diagnosis not present

## 2020-05-11 DIAGNOSIS — I998 Other disorder of circulatory system: Secondary | ICD-10-CM

## 2020-05-11 HISTORY — PX: ABDOMINAL AORTOGRAM W/LOWER EXTREMITY: CATH118223

## 2020-05-11 LAB — POCT I-STAT, CHEM 8
BUN: 23 mg/dL — ABNORMAL HIGH (ref 6–20)
Calcium, Ion: 1.1 mmol/L — ABNORMAL LOW (ref 1.15–1.40)
Chloride: 104 mmol/L (ref 98–111)
Creatinine, Ser: 0.9 mg/dL (ref 0.44–1.00)
Glucose, Bld: 229 mg/dL — ABNORMAL HIGH (ref 70–99)
HCT: 45 % (ref 36.0–46.0)
Hemoglobin: 15.3 g/dL — ABNORMAL HIGH (ref 12.0–15.0)
Potassium: 5.4 mmol/L — ABNORMAL HIGH (ref 3.5–5.1)
Sodium: 138 mmol/L (ref 135–145)
TCO2: 25 mmol/L (ref 22–32)

## 2020-05-11 LAB — GLUCOSE, CAPILLARY
Glucose-Capillary: 217 mg/dL — ABNORMAL HIGH (ref 70–99)
Glucose-Capillary: 102 mg/dL — ABNORMAL HIGH (ref 70–99)

## 2020-05-11 SURGERY — ABDOMINAL AORTOGRAM W/LOWER EXTREMITY
Anesthesia: LOCAL | Laterality: Bilateral

## 2020-05-11 MED ORDER — LABETALOL HCL 5 MG/ML IV SOLN: 10.0000 mg | INTRAVENOUS | Status: DC | PRN

## 2020-05-11 MED ORDER — HEPARIN (PORCINE) IN NACL 1000-0.9 UT/500ML-% IV SOLN
INTRAVENOUS | Status: DC | PRN
  Administered 2020-05-11 (×2): 500 mL

## 2020-05-11 MED ORDER — HYDRALAZINE HCL 20 MG/ML IJ SOLN: 5.0000 mg | INTRAMUSCULAR | Status: DC | PRN

## 2020-05-11 MED ORDER — SODIUM CHLORIDE 0.9 % IV SOLN: 250.0000 mL | INTRAVENOUS | Status: DC | PRN

## 2020-05-11 MED ORDER — SODIUM CHLORIDE 0.9% FLUSH: 3.0000 mL | Freq: Two times a day (BID) | INTRAVENOUS | Status: DC

## 2020-05-11 MED ORDER — LIDOCAINE HCL (PF) 1 % IJ SOLN
INTRAMUSCULAR | Status: DC | PRN
  Administered 2020-05-11: 15 mL

## 2020-05-11 MED ORDER — LIDOCAINE HCL (PF) 1 % IJ SOLN
INTRAMUSCULAR | Status: AC
  Filled 2020-05-11: qty 30

## 2020-05-11 MED ORDER — SODIUM CHLORIDE 0.9 % WEIGHT BASED INFUSION: 1.0000 mL/kg/h | INTRAVENOUS | Status: DC

## 2020-05-11 MED ORDER — SODIUM CHLORIDE 0.9 % IV SOLN: INTRAVENOUS | Status: DC

## 2020-05-11 MED ORDER — ONDANSETRON HCL 4 MG/2ML IJ SOLN: 4.0000 mg | Freq: Four times a day (QID) | INTRAMUSCULAR | Status: DC | PRN

## 2020-05-11 MED ORDER — ACETAMINOPHEN 325 MG PO TABS: 650.0000 mg | ORAL_TABLET | ORAL | Status: DC | PRN

## 2020-05-11 MED ORDER — HEPARIN (PORCINE) IN NACL 1000-0.9 UT/500ML-% IV SOLN
INTRAVENOUS | Status: AC
  Filled 2020-05-11: qty 1000

## 2020-05-11 MED ORDER — IODIXANOL 320 MG/ML IV SOLN
INTRAVENOUS | Status: DC | PRN
  Administered 2020-05-11: 120 mL

## 2020-05-11 MED ORDER — SODIUM CHLORIDE 0.9% FLUSH: 3.0000 mL | INTRAVENOUS | Status: DC | PRN

## 2020-05-11 SURGICAL SUPPLY — 12 items
CATH OMNI FLUSH 5F 65CM (CATHETERS) ×2 IMPLANT
KIT MICROPUNCTURE NIT STIFF (SHEATH) ×2 IMPLANT
KIT PV (KITS) ×2 IMPLANT
PINNACLE LONG 5F 25CM (SHEATH) ×2
SHEATH INTROD PINNACLE 5F 25CM (SHEATH) ×1 IMPLANT
SHEATH PINNACLE 5F 10CM (SHEATH) ×2 IMPLANT
SHEATH PROBE COVER 6X72 (BAG) ×2 IMPLANT
SYR MEDRAD MARK V 150ML (SYRINGE) ×2 IMPLANT
TRANSDUCER W/STOPCOCK (MISCELLANEOUS) ×2 IMPLANT
TRAY PV CATH (CUSTOM PROCEDURE TRAY) ×2 IMPLANT
WIRE BENTSON .035X145CM (WIRE) ×4 IMPLANT
WIRE TORQFLEX AUST .018X40CM (WIRE) ×4 IMPLANT

## 2020-05-11 NOTE — Progress Notes (Signed)
Bilateral Lower vein mapping.    Please see CV Proc for preliminary results.   Clint Guy, RVT

## 2020-05-11 NOTE — Discharge Instructions (Signed)
Femoral Site Care This sheet gives you information about how to care for yourself after your procedure. Your health care provider may also give you more specific instructions. If you have problems or questions, contact your health care provider. What can I expect after the procedure? After the procedure, it is common to have:  Bruising that usually fades within 1-2 weeks.  Tenderness at the site. Follow these instructions at home: Wound care  Follow instructions from your health care provider about how to take care of your insertion site. Make sure you: ? Wash your hands with soap and water before you change your bandage (dressing). If soap and water are not available, use hand sanitizer. ? Change your dressing as told by your health care provider. ? Leave stitches (sutures), skin glue, or adhesive strips in place. These skin closures may need to stay in place for 2 weeks or longer. If adhesive strip edges start to loosen and curl up, you may trim the loose edges. Do not remove adhesive strips completely unless your health care provider tells you to do that.  Do not take baths, swim, or use a hot tub until your health care provider approves.  You may shower 24-48 hours after the procedure or as told by your health care provider. ? Gently wash the site with plain soap and water. ? Pat the area dry with a clean towel. ? Do not rub the site. This may cause bleeding.  Do not apply powder or lotion to the site. Keep the site clean and dry.  Check your femoral site every day for signs of infection. Check for: ? Redness, swelling, or pain. ? Fluid or blood. ? Warmth. ? Pus or a bad smell. Activity  For the first 2-3 days after your procedure, or as long as directed: ? Avoid climbing stairs as much as possible. ? Do not squat.  Do not lift anything that is heavier than 10 lb (4.5 kg), or the limit that you are told, until your health care provider says that it is safe.  Rest as  directed. ? Avoid sitting for a long time without moving. Get up to take short walks every 1-2 hours.  Do not drive for 24 hours if you were given a medicine to help you relax (sedative). General instructions  Take over-the-counter and prescription medicines only as told by your health care provider.  Keep all follow-up visits as told by your health care provider. This is important. Contact a health care provider if you have:  A fever or chills.  You have redness, swelling, or pain around your insertion site. Get help right away if:  The catheter insertion area swells very fast.  You pass out.  You suddenly start to sweat or your skin gets clammy.  The catheter insertion area is bleeding, and the bleeding does not stop when you hold steady pressure on the area.  The area near or just beyond the catheter insertion site becomes pale, cool, tingly, or numb. These symptoms may represent a serious problem that is an emergency. Do not wait to see if the symptoms will go away. Get medical help right away. Call your local emergency services (911 in the [place] Do not drive yourself to the hospital. Summary  After the procedure, it is common to have bruising that usually fades within 1-2 weeks.  Check your femoral site every day for signs of infection.  Do not lift anything that is heavier than 10 lb (4.5 kg), or the   limit that you are told, until your health care provider says that it is safe. This information is not intended to replace advice given to you by your health care provider. Make sure you discuss any questions you have with your health care provider. Document Revised: 05/05/2017 Document Reviewed: 05/05/2017 Elsevier Patient Education  2020 [employer] Inc[employer]  

## 2020-05-11 NOTE — Progress Notes (Addendum)
Site area: Left groin a 5 french long sheath was removed  Site Prior to Removal:  Level 0  Pressure Applied For 30 MINUTES    Bedrest Beginning at 1430p  Manual:   Yes.    Patient Status During Pull:  stable  Post Pull Groin Site:  Level 0  Post Pull Instructions Given:  Yes.    Post Pull Pulses Present:  Yes.    Dressing Applied:  Yes.    Comments:

## 2020-05-11 NOTE — Op Note (Addendum)
    Patient name: Christina Lamb MRN: [id number] DOB: [birth year] Sex: female  05/11/2020 Pre-operative Diagnosis: Critical limb ischemia of the right lower extremity with tissue loss Post-operative diagnosis:  Same Surgeon:  Cephus Shelling, MD Procedure Performed: 1.  Ultrasound-guided access of the left common femoral artery 2.  Aortogram with bilateral lower extremity arteriogram  Indications: Patient is a 60 year old female who was seen in consultation for a right foot ulcer with an ABI of 0.51 and a toe pressure of 0.  She presents with critical limb ischemia and today we are planning right leg arteriogram and possible intervention after risk benefits discussed.  Findings:   Aortogram showed two right and one left renal artery with a widely patent aortoiliac segment and no flow limiting stenosis.  On the right side, which is the side of interest she appears to have a patent common femoral and profunda with a flush SFA occlusion.  She does reconstitute a above-knee popliteal artery with a patent below-knee popliteal artery and initial three-vessel runoff.  Dominant runoff appears to be in the peroneal whereas the anterior tibial disease is diseased and the posterior tibial occludes in the mid calf.  Left lower extremity has a moderately diseased common femoral with a patent but diffusely diseased SFA and two-vessel runoff via anterior tibial and peroneal artery.   Procedure:  The patient was identified in the holding area and taken to room 8.  The patient was then placed supine on the table and prepped and draped in the usual sterile fashion.  A time out was called.  Ultrasound was used to evaluate the left common femoral artery.  It was patent .  A digital ultrasound image was acquired.  A micropuncture needle was used to access the left common femoral artery under ultrasound guidance.  An 018 wire was advanced without resistance and a micropuncture sheath was placed.  The 018 wire was  removed and a benson wire was placed.  The micropuncture sheath was exchanged for a 5 french sheath.  An omniflush catheter was advanced over the wire to the level of L-1.  An abdominal angiogram was obtained.  Next the catheter was pulled down and bilateral lower extremity runoff was obtained.  After evaluating the image appears to have a flush SFA occlusion on the right and does not have any endovascular options.  She will need to be evaluated for right femoral to popliteal artery bypass.  That point in time we did exchange for a longer 5 French sheath in the left groin given her obesity I did want not want her sheath to get pulled out in transport.  She remained stable and will be taken to holding to have her sheath removed.   Plan: Patient will be scheduled for a right common femoral to popliteal artery bypass.  Vein mapping today.  I offered to schedule this Monday and she wants next Friday when she can find transportation.    Cephus Shelling, MD [facility] [place] Office: [phone]   Cephus Shelling

## 2020-05-11 NOTE — H&P (Signed)
History and Physical Interval Note:  05/11/2020 12:00 PM  HANNI MILFORD  has presented today for surgery, with the diagnosis of pvd.  The various methods of treatment have been discussed with the patient and family. After consideration of risks, benefits and other options for treatment, the patient has consented to  Procedure(s): ABDOMINAL AORTOGRAM W/LOWER EXTREMITY (N/A) as a surgical intervention.  The patient's history has been reviewed, patient examined, no change in status, stable for surgery.  I have reviewed the patient's chart and labs.  Questions were answered to the patient's satisfaction.    Aortogram, LE arteriogram, CLI with right foot ulcer.  Cephus Shelling  Patient name: Amyra Vantuyl   MRN: [id number]        DOB: 1960-05-14            Sex: female  REASON FOR CONSULT: Right foot ulcer  HPI: Trenese Haft is a 60 y.o. female, with diabetes that presents for evaluation of PAD and right foot ulcer.  She has had an ulcer on the right foot for approximately 20 weeks.  She states she has been to a number of different doctors including wound clinics and this has failed to heal with multiple treatments.  She states she had wounds on her left foot that have healed immediately.  She denies any previous lower extremity revascularizations.  She used to smoke but has since quit.  She is ambulatory.  She is currently walking with a Darco shoe and states she has a lot of pain in the foot when she is walking.      Past Medical History:  Diagnosis Date  . Diabetes mellitus without complication [facility])          Past Surgical History:  Procedure Laterality Date  . ACHILLES TENDON REPAIR           Family History  Problem Relation Age of Onset  . Heart disease Father     SOCIAL HISTORY: Social History        Socioeconomic History  . Marital status: Single    Spouse name: Not on file  . Number of children: Not on file  . Years of education: Not on file  . Highest  education level: Not on file  Occupational History  . Not on file  Tobacco Use  . Smoking status: Former Smoker    Packs/day: 0.50    Years: 15.00    Pack years: 7.50    Types: Cigarettes    Quit date: 2018    Years since quitting: 3.9  . Smokeless tobacco: Never Used  Substance and Sexual Activity  . Alcohol use: Never  . Drug use: Never  . Sexual activity: Not on file  Other Topics Concern  . Not on file  Social History Narrative  . Not on file   Social Determinants of Health   Financial Resource Strain: Not on file  Food Insecurity: Not on file  Transportation Needs: Not on file  Physical Activity: Not on file  Stress: Not on file  Social Connections: Not on file  Intimate Partner Violence: Not on file        Allergies  Allergen Reactions  . Hydromet [Hydrocodone-Homatropine]   . Penicillins Swelling  . Codeine Nausea And Vomiting          Current Outpatient Medications  Medication Sig Dispense Refill  . albuterol (PROVENTIL HFA;VENTOLIN HFA) 108 (90 Base) MCG/ACT inhaler Inhale into the lungs every 6 (six) hours as needed for wheezing or shortness  of breath.    Marland Kitchen aspirin EC 81 MG tablet Take 81 mg by mouth daily.    Marland Kitchen glimepiride (AMARYL) 4 MG tablet Take 1 tablet by mouth daily.    . INVOKAMET [phone] MG TABS Take 1 tablet by mouth 2 (two) times daily.  4  . lisinopril (PRINIVIL,ZESTRIL) 20 MG tablet Take 20 mg by mouth daily.     Marland Kitchen omega-3 acid ethyl esters (LOVAZA) 1 g capsule Take by mouth 2 (two) times daily.     No current facility-administered medications for this visit.    REVIEW OF SYSTEMS:  [X]  denotes positive finding, [ ]  denotes negative finding Cardiac  Comments:  Chest pain or chest pressure:    Shortness of breath upon exertion:    Short of breath when lying flat:    Irregular heart rhythm:        Vascular    Pain in calf, thigh, or hip brought on by ambulation:    Pain in feet at  night that wakes you up from your sleep:     Blood clot in your veins:    Leg swelling:         Pulmonary    Oxygen at home:    Productive cough:     Wheezing:         Neurologic    Sudden weakness in arms or legs:     Sudden numbness in arms or legs:     Sudden onset of difficulty speaking or slurred speech:    Temporary loss of vision in one eye:     Problems with dizziness:         Gastrointestinal    Blood in stool:     Vomited blood:         Genitourinary    Burning when urinating:     Blood in urine:        Psychiatric    Major depression:         Hematologic    Bleeding problems:    Problems with blood clotting too easily:        Skin    Rashes or ulcers:        Constitutional    Fever or chills:      PHYSICAL EXAM:    Vitals:   04/25/20 1529  BP: 104/69  Pulse: 88  Resp: 16  Temp: 97.8 F (36.6 C)  TempSrc: Temporal  SpO2: 95%  Weight: 217 lb (98.4 kg)  Height: 5' 4.5" (1.638 m)    GENERAL: The patient is a well-nourished female, in no acute distress. The vital signs are documented above. CARDIAC: There is a regular rate and rhythm.  VASCULAR:  Palpable femoral pulses bilaterally Left DP palpable Right pedal pulses nonpalpable Right fifth metatarsal ulcer as pictured below PULMONARY: No respiratory distress. ABDOMEN: Soft and non-tender. MUSCULOSKELETAL: There are no major deformities or cyanosis. NEUROLOGIC: No focal weakness or paresthesias are detected. SKIN: There are no ulcers or rashes noted. PSYCHIATRIC: The patient has a normal affect.      DATA:   ABIs today are 0.51 on the right monophasic with a toe pressure of 0.  On the left her ABIs 0.75 and triphasic at the ankle.  Assessment/Plan:  60 year old female presents with critical limb ischemia of the right lower extremity with tissue loss.  She has an ulcer on the right fifth  metatarsal head now for 20 weeks.  Discussed with her that she has severely depressed ABI of 0.5 with  a monophasic waveform and a toe pressure of 0 which is certainly inadequate for wound healing.  I discussed that she will need aortogram lower extremity arteriogram and possible intervention.  Previous arterial duplex in 2019 did show a right SFA occlusion and we discussed that she may ultimately require bypass at a later date.  I am out of town next week and offered to get her scheduled with one of my partners for arteriogram but she wants to wait until the week after when I have returned.  I will get her scheduled today.  Risks and benefits of arteriogram were discussed in detail.   Cephus Shelling, MD [facility] [place] Office: [phone]

## 2020-05-12 ENCOUNTER — Other Ambulatory Visit: Payer: Self-pay

## 2020-05-12 ENCOUNTER — Encounter (HOSPITAL_COMMUNITY): Payer: Self-pay | Admitting: Vascular Surgery

## 2020-05-15 NOTE — Progress Notes (Signed)
Your procedure is scheduled on Friday 05/19/20.  Report to [facility] Main Entrance "A" at 05:30 A.M., and check in at the Admitting office.  Call this number if you have problems the morning of surgery: [phone]  Call [phone] if you have any questions prior to your surgery date Monday-Friday 8am-4pm   Remember: Do not eat or drink after midnight the night before your surgery  Take these medicines the morning of surgery with A SIP OF WATER: aspirin EC atorvastatin (LIPITOR)  ciprofloxacin (CIPRO)  If needed: albuterol (PROVENTIL HFA;VENTOLIN HFA) --- Please bring all inhalers with you the day of surgery.    As of today, STOP taking any Aspirin (unless otherwise instructed by your surgeon), Aleve, Naproxen, Ibuprofen, Motrin, Advil, Goody's, BC's, all herbal medications, fish oil, and all vitamins.  WHAT DO I DO ABOUT MY DIABETES MEDICATION?  Day/Night before surgery: dapagliflozin propanediol (FARXIGA) - NONE INVOKAMET - NONE  Day of surgery: dapagliflozin propanediol (FARXIGA) - NONE INVOKAMET - NONE  . DO NOT take oral diabetic medication the morning of surgery.   HOW TO MANAGE YOUR DIABETES BEFORE AND AFTER SURGERY  Why is it important to control my blood sugar before and after surgery? . Improving blood sugar levels before and after surgery helps healing and can limit problems. . A way of improving blood sugar control is eating a healthy diet by: o  Eating less sugar and carbohydrates o  Increasing activity/exercise o  Talking with your doctor about reaching your blood sugar goals . High blood sugars (greater than 180 mg/dL) can raise your risk of infections and slow your recovery, so you will need to focus on controlling your diabetes during the weeks before surgery. . Make sure that the doctor who takes care of your diabetes knows about your planned surgery including the date and location.  How do I manage my blood sugar before surgery? . Check your  blood sugar at least 4 times a day, starting 2 days before surgery, to make sure that the level is not too high or low. . Check your blood sugar the morning of your surgery when you wake up and every 2 hours until you get to the Short Stay unit. o If your blood sugar is less than 70 mg/dL, you will need to treat for low blood sugar: - Do not take insulin. - Treat a low blood sugar (less than 70 mg/dL) with  cup of clear juice (cranberry or apple), 4 glucose tablets, OR glucose gel. - Recheck blood sugar in 15 minutes after treatment (to make sure it is greater than 70 mg/dL). If your blood sugar is not greater than 70 mg/dL on recheck, call [phone] for further instructions. . Report your blood sugar to the short stay nurse when you get to Short Stay.  . If you are admitted to the hospital after surgery: o Your blood sugar will be checked by the staff and you will probably be given insulin after surgery (instead of oral diabetes medicines) to make sure you have good blood sugar levels. o The goal for blood sugar control after surgery is 80-180 mg/dL.    The Morning of Surgery  Do not wear jewelry, make-up or nail polish.  Do not wear lotions, powders, or perfumes, or deodorant  Do not shave 48 hours prior to surgery.  Do not bring valuables to the hospital.  [facility] is not responsible for any belongings or valuables.  If you are a smoker, DO NOT Smoke 24  hours prior to surgery  If you wear a CPAP at night please bring your mask the morning of surgery   Remember that you must have someone to transport you home after your surgery, and remain with you for 24 hours if you are discharged the same day.   Please bring cases for contacts, glasses, hearing aids, dentures or bridgework because it cannot be worn into surgery.    Leave your suitcase in the car.  After surgery it may be brought to your room.  For patients admitted to the hospital, discharge time will be determined by your  treatment team.  Patients discharged the day of surgery will not be allowed to drive home.    Special instructions:   [place]- Preparing For Surgery  Before surgery, you can play an important role. Because skin is not sterile, your skin needs to be as free of germs as possible. You can reduce the number of germs on your skin by washing with CHG (chlorahexidine gluconate) Soap before surgery.  CHG is an antiseptic cleaner which kills germs and bonds with the skin to continue killing germs even after washing.    Oral Hygiene is also important to reduce your risk of infection.  Remember - BRUSH YOUR TEETH THE MORNING OF SURGERY WITH YOUR REGULAR TOOTHPASTE  Please do not use if you have an allergy to CHG or antibacterial soaps. If your skin becomes reddened/irritated stop using the CHG.  Do not shave (including legs and underarms) for at least 48 hours prior to first CHG shower. It is OK to shave your face.  Please follow these instructions carefully.   1. Shower the NIGHT BEFORE SURGERY and the MORNING OF SURGERY with CHG Soap.   2. If you chose to wash your hair and body, wash as usual with your normal shampoo and body-wash/soap.  3. Rinse your hair and body thoroughly to remove the shampoo and soap.  4. Apply CHG directly to the skin (ONLY FROM THE NECK DOWN) and wash gently with a scrungie or a clean washcloth.   5. Do not use on open wounds or open sores. Avoid contact with your eyes, ears, mouth and genitals (private parts). Wash Face and genitals (private parts)  with your normal soap.   6. Wash thoroughly, paying special attention to the area where your surgery will be performed.  7. Thoroughly rinse your body with warm water from the neck down.  8. DO NOT shower/wash with your normal soap after using and rinsing off the CHG Soap.  9. Pat yourself dry with a CLEAN TOWEL.  10. Wear CLEAN PAJAMAS to bed the night before surgery  11. Place CLEAN SHEETS on your bed the night  of your first shower and DO NOT SLEEP WITH PETS.  12. Wear comfortable clothes the morning of surgery.     Day of Surgery:  Please shower the morning of surgery with the CHG soap Do not apply any deodorants/lotions. Please wear clean clothes to the hospital/surgery center.   Remember to brush your teeth WITH YOUR REGULAR TOOTHPASTE.   Please read over the following fact sheets that you were given.

## 2020-05-16 ENCOUNTER — Other Ambulatory Visit: Payer: Self-pay

## 2020-05-16 ENCOUNTER — Encounter (HOSPITAL_COMMUNITY): Payer: Self-pay

## 2020-05-16 ENCOUNTER — Encounter (HOSPITAL_COMMUNITY)
Admission: RE | Admit: 2020-05-16 | Discharge: 2020-05-16 | Disposition: A | Discharge status: Home / Self Care | Payer: No Typology Code available for payment source | Source: Ambulatory Visit | Attending: Vascular Surgery | Admitting: Vascular Surgery

## 2020-05-16 DIAGNOSIS — Z01812 Encounter for preprocedural laboratory examination: Secondary | ICD-10-CM | POA: Diagnosis present

## 2020-05-16 HISTORY — DX: Essential (primary) hypertension: I10

## 2020-05-16 LAB — URINALYSIS, MICROSCOPIC (REFLEX)
Bacteria, UA: NONE SEEN
RBC / HPF: NONE SEEN RBC/hpf (ref 0–5)
WBC, UA: NONE SEEN WBC/hpf (ref 0–5)

## 2020-05-16 LAB — COMPREHENSIVE METABOLIC PANEL
ALT: 28 U/L (ref 0–44)
AST: 32 U/L (ref 15–41)
Albumin: 3.7 g/dL (ref 3.5–5.0)
Alkaline Phosphatase: 76 U/L (ref 38–126)
Anion gap: 13 (ref 5–15)
BUN: 11 mg/dL (ref 6–20)
CO2: 17 mmol/L — ABNORMAL LOW (ref 22–32)
Calcium: 9.3 mg/dL (ref 8.9–10.3)
Chloride: 106 mmol/L (ref 98–111)
Creatinine, Ser: 1.07 mg/dL — ABNORMAL HIGH (ref 0.44–1.00)
GFR, Estimated: 60 mL/min — ABNORMAL LOW (ref >60)
Glucose, Bld: 134 mg/dL — ABNORMAL HIGH (ref 70–99)
Potassium: 4.6 mmol/L (ref 3.5–5.1)
Sodium: 136 mmol/L (ref 135–145)
Total Bilirubin: 0.8 mg/dL (ref 0.3–1.2)
Total Protein: 6.8 g/dL (ref 6.5–8.1)

## 2020-05-16 LAB — GLUCOSE, CAPILLARY: Glucose-Capillary: 147 mg/dL — ABNORMAL HIGH (ref 70–99)

## 2020-05-16 LAB — URINALYSIS, ROUTINE W REFLEX MICROSCOPIC
Bilirubin Urine: NEGATIVE
Glucose, UA: >=500 mg/dL — AB
Hgb urine dipstick: NEGATIVE
Ketones, ur: NEGATIVE mg/dL
Leukocytes,Ua: NEGATIVE
Nitrite: NEGATIVE
Protein, ur: NEGATIVE mg/dL
Specific Gravity, Urine: 1.01 (ref 1.005–1.030)
pH: 5 (ref 5.0–8.0)

## 2020-05-16 LAB — SURGICAL PCR SCREEN
MRSA, PCR: NEGATIVE
Staphylococcus aureus: NEGATIVE

## 2020-05-16 LAB — BLOOD GAS, ARTERIAL
Acid-base deficit: 6.3 mmol/L — ABNORMAL HIGH (ref 0.0–2.0)
Bicarbonate: 18.1 mmol/L — ABNORMAL LOW (ref 20.0–28.0)
FIO2: 21
O2 Saturation: 98.7 %
Patient temperature: 37
pCO2 arterial: 32.5 mmHg (ref 32.0–48.0)
pH, Arterial: 7.364 (ref 7.350–7.450)
pO2, Arterial: 122 mmHg — ABNORMAL HIGH (ref 83.0–108.0)

## 2020-05-16 LAB — CBC
HCT: 46.9 % — ABNORMAL HIGH (ref 36.0–46.0)
Hemoglobin: 14.8 g/dL (ref 12.0–15.0)
MCH: 29 pg (ref 26.0–34.0)
MCHC: 31.6 g/dL (ref 30.0–36.0)
MCV: 92 fL (ref 80.0–100.0)
Platelets: 247 10*3/uL (ref 150–400)
RBC: 5.1 MIL/uL (ref 3.87–5.11)
RDW: 14 % (ref 11.5–15.5)
WBC: 11.3 10*3/uL — ABNORMAL HIGH (ref 4.0–10.5)
nRBC: 0 % (ref 0.0–0.2)

## 2020-05-16 LAB — APTT: aPTT: 29 seconds (ref 24–36)

## 2020-05-16 LAB — PROTIME-INR
INR: 0.9 (ref 0.8–1.2)
Prothrombin Time: 12 seconds (ref 11.4–15.2)

## 2020-05-16 LAB — TYPE AND SCREEN
ABO/RH(D): O NEG
Antibody Screen: NEGATIVE

## 2020-05-16 NOTE — Progress Notes (Signed)
PCP - Dr. Kirstie Peri  Cardiologist - denies   Chest x-ray - N/A EKG - 05/11/20 Stress Test - denies ECHO - denies Cardiac Cath - denies  Sleep Study - denies   Fasting Blood Sugar - 80-100 Checks Blood Sugar daily  Aspirin Instructions: continue ASA including DOS. Patient instructed to hold all NSAID's, herbal medications, fish oil and vitamins as of today     COVID TEST- 05/19/20 @ [facility]. Pt informed to quarantine following covid test until DOS.   Anesthesia review:   Patient denies shortness of breath, fever, cough and chest pain at PAT appointment   All instructions explained to the patient, with a verbal understanding of the material. Patient agrees to go over the instructions while at home for a better understanding. Patient also instructed to self quarantine after being tested for COVID-19. The opportunity to ask questions was provided.

## 2020-05-17 ENCOUNTER — Other Ambulatory Visit
Admission: RE | Admit: 2020-05-17 | Discharge: 2020-05-17 | Disposition: A | Discharge status: Home / Self Care | Payer: No Typology Code available for payment source | Source: Ambulatory Visit | Attending: Vascular Surgery | Admitting: Vascular Surgery

## 2020-05-17 DIAGNOSIS — Z20822 Contact with and (suspected) exposure to covid-19: Secondary | ICD-10-CM | POA: Diagnosis not present

## 2020-05-17 DIAGNOSIS — Z01812 Encounter for preprocedural laboratory examination: Secondary | ICD-10-CM | POA: Diagnosis not present

## 2020-05-17 LAB — SARS CORONAVIRUS 2 (TAT 6-24 HRS): SARS Coronavirus 2: NEGATIVE

## 2020-05-19 ENCOUNTER — Inpatient Hospital Stay (HOSPITAL_COMMUNITY): Payer: No Typology Code available for payment source | Admitting: Anesthesiology

## 2020-05-19 ENCOUNTER — Encounter (HOSPITAL_COMMUNITY)
Admission: RE | Disposition: A | Discharge status: Home / Self Care | Payer: Self-pay | Source: Home / Self Care | Attending: Vascular Surgery

## 2020-05-19 ENCOUNTER — Telehealth (HOSPITAL_COMMUNITY): Payer: Self-pay

## 2020-05-19 ENCOUNTER — Encounter (HOSPITAL_COMMUNITY): Payer: Self-pay | Admitting: Vascular Surgery

## 2020-05-19 ENCOUNTER — Other Ambulatory Visit: Payer: Self-pay

## 2020-05-19 ENCOUNTER — Observation Stay (HOSPITAL_COMMUNITY)
Admission: RE | Admit: 2020-05-19 | Discharge: 2020-05-22 | Disposition: A | Discharge status: Home / Self Care | Payer: No Typology Code available for payment source | Attending: Vascular Surgery | Admitting: Vascular Surgery

## 2020-05-19 DIAGNOSIS — I739 Peripheral vascular disease, unspecified: Secondary | ICD-10-CM

## 2020-05-19 DIAGNOSIS — Z7982 Long term (current) use of aspirin: Secondary | ICD-10-CM

## 2020-05-19 DIAGNOSIS — I998 Other disorder of circulatory system: Secondary | ICD-10-CM

## 2020-05-19 DIAGNOSIS — L97519 Non-pressure chronic ulcer of other part of right foot with unspecified severity: Secondary | ICD-10-CM | POA: Diagnosis present

## 2020-05-19 DIAGNOSIS — I10 Essential (primary) hypertension: Secondary | ICD-10-CM | POA: Diagnosis present

## 2020-05-19 DIAGNOSIS — Z7984 Long term (current) use of oral hypoglycemic drugs: Secondary | ICD-10-CM | POA: Diagnosis not present

## 2020-05-19 DIAGNOSIS — I70235 Atherosclerosis of native arteries of right leg with ulceration of other part of foot: Secondary | ICD-10-CM | POA: Diagnosis not present

## 2020-05-19 DIAGNOSIS — Z87891 Personal history of nicotine dependence: Secondary | ICD-10-CM | POA: Diagnosis not present

## 2020-05-19 DIAGNOSIS — Z8249 Family history of ischemic heart disease and other diseases of the circulatory system: Secondary | ICD-10-CM

## 2020-05-19 DIAGNOSIS — Z79899 Other long term (current) drug therapy: Secondary | ICD-10-CM

## 2020-05-19 DIAGNOSIS — E11621 Type 2 diabetes mellitus with foot ulcer: Secondary | ICD-10-CM | POA: Diagnosis not present

## 2020-05-19 DIAGNOSIS — Z880 Allergy status to penicillin: Secondary | ICD-10-CM

## 2020-05-19 DIAGNOSIS — E1151 Type 2 diabetes mellitus with diabetic peripheral angiopathy without gangrene: Principal | ICD-10-CM | POA: Diagnosis present

## 2020-05-19 DIAGNOSIS — Z885 Allergy status to narcotic agent status: Secondary | ICD-10-CM

## 2020-05-19 HISTORY — PX: ENDARTERECTOMY FEMORAL: SHX5804

## 2020-05-19 HISTORY — PX: FEMORAL-POPLITEAL BYPASS GRAFT: SHX937

## 2020-05-19 HISTORY — PX: PATCH ANGIOPLASTY: SHX6230

## 2020-05-19 LAB — CBC
HCT: 40 % (ref 36.0–46.0)
Hemoglobin: 12.7 g/dL (ref 12.0–15.0)
MCH: 28.8 pg (ref 26.0–34.0)
MCHC: 31.8 g/dL (ref 30.0–36.0)
MCV: 90.7 fL (ref 80.0–100.0)
Platelets: 202 10*3/uL (ref 150–400)
RBC: 4.41 MIL/uL (ref 3.87–5.11)
RDW: 14.2 % (ref 11.5–15.5)
WBC: 17.6 10*3/uL — ABNORMAL HIGH (ref 4.0–10.5)
nRBC: 0 % (ref 0.0–0.2)

## 2020-05-19 LAB — CREATININE, SERUM
Creatinine, Ser: 0.87 mg/dL (ref 0.44–1.00)
GFR, Estimated: >60 mL/min (ref >60)

## 2020-05-19 LAB — GLUCOSE, CAPILLARY
Glucose-Capillary: 224 mg/dL — ABNORMAL HIGH (ref 70–99)
Glucose-Capillary: 210 mg/dL — ABNORMAL HIGH (ref 70–99)

## 2020-05-19 LAB — POCT ACTIVATED CLOTTING TIME
Activated Clotting Time: 237 seconds
Activated Clotting Time: 285 seconds

## 2020-05-19 LAB — ABO/RH: ABO/RH(D): O NEG

## 2020-05-19 SURGERY — BYPASS GRAFT FEMORAL-POPLITEAL ARTERY
Anesthesia: General | Site: Leg Upper | Laterality: Right

## 2020-05-19 MED ORDER — CHLORHEXIDINE GLUCONATE CLOTH 2 % EX PADS: 6.0000 | MEDICATED_PAD | Freq: Once | CUTANEOUS | Status: DC

## 2020-05-19 MED ORDER — SODIUM CHLORIDE 0.9 % IV SOLN
INTRAVENOUS | Status: AC
  Filled 2020-05-19: qty 1.2

## 2020-05-19 MED ORDER — CHLORHEXIDINE GLUCONATE CLOTH 2 % EX PADS
6.0000 | MEDICATED_PAD | Freq: Every day | CUTANEOUS | Status: DC
  Administered 2020-05-19 – 2020-05-21 (×3): 6 via TOPICAL

## 2020-05-19 MED ORDER — LABETALOL HCL 5 MG/ML IV SOLN
INTRAVENOUS | Status: DC | PRN
  Administered 2020-05-19 (×2): 10 mg via INTRAVENOUS

## 2020-05-19 MED ORDER — KETAMINE HCL 10 MG/ML IJ SOLN
INTRAMUSCULAR | Status: DC | PRN
  Administered 2020-05-19: 20 mg via INTRAVENOUS
  Administered 2020-05-19 (×2): 15 mg via INTRAVENOUS

## 2020-05-19 MED ORDER — ONDANSETRON HCL 4 MG/2ML IJ SOLN
INTRAMUSCULAR | Status: DC | PRN
  Administered 2020-05-19: 4 mg via INTRAVENOUS

## 2020-05-19 MED ORDER — HEPARIN SODIUM (PORCINE) 1000 UNIT/ML IJ SOLN
INTRAMUSCULAR | Status: AC
  Filled 2020-05-19: qty 1

## 2020-05-19 MED ORDER — LISINOPRIL 10 MG PO TABS
20.0000 mg | ORAL_TABLET | Freq: Every day | ORAL | Status: DC
  Administered 2020-05-19 – 2020-05-22 (×4): 20 mg via ORAL
  Filled 2020-05-19 (×5): qty 2

## 2020-05-19 MED ORDER — AMISULPRIDE (ANTIEMETIC) 5 MG/2ML IV SOLN: 10.0000 mg | Freq: Once | INTRAVENOUS | Status: DC | PRN

## 2020-05-19 MED ORDER — METOPROLOL TARTRATE 5 MG/5ML IV SOLN: 2.0000 mg | INTRAVENOUS | Status: DC | PRN

## 2020-05-19 MED ORDER — MEPERIDINE HCL 25 MG/ML IJ SOLN: 6.2500 mg | INTRAMUSCULAR | Status: DC | PRN

## 2020-05-19 MED ORDER — FENTANYL CITRATE (PF) 250 MCG/5ML IJ SOLN
INTRAMUSCULAR | Status: AC
  Filled 2020-05-19: qty 5

## 2020-05-19 MED ORDER — PHENYLEPHRINE HCL-NACL 10-0.9 MG/250ML-% IV SOLN
INTRAVENOUS | Status: AC
  Filled 2020-05-19: qty 250

## 2020-05-19 MED ORDER — ACETAMINOPHEN 10 MG/ML IV SOLN: 1000.0000 mg | Freq: Once | INTRAVENOUS | Status: DC | PRN

## 2020-05-19 MED ORDER — SODIUM CHLORIDE 0.9 % IV SOLN: INTRAVENOUS | Status: DC

## 2020-05-19 MED ORDER — PROTAMINE SULFATE 10 MG/ML IV SOLN
INTRAVENOUS | Status: AC
  Filled 2020-05-19: qty 5

## 2020-05-19 MED ORDER — 0.9 % SODIUM CHLORIDE (POUR BTL) OPTIME
TOPICAL | Status: DC | PRN
  Administered 2020-05-19: 2000 mL

## 2020-05-19 MED ORDER — SUGAMMADEX SODIUM 200 MG/2ML IV SOLN
INTRAVENOUS | Status: DC | PRN
  Administered 2020-05-19: 200 mg via INTRAVENOUS

## 2020-05-19 MED ORDER — ACETAMINOPHEN 160 MG/5ML PO SOLN
325.0000 mg | Freq: Once | ORAL | Status: DC | PRN
Start: 2020-05-19 — End: 2020-05-19

## 2020-05-19 MED ORDER — LIDOCAINE 2% (20 MG/ML) 5 ML SYRINGE
INTRAMUSCULAR | Status: DC | PRN
  Administered 2020-05-19: 40 mg via INTRAVENOUS

## 2020-05-19 MED ORDER — HYDROMORPHONE HCL 1 MG/ML IJ SOLN: 0.2500 mg | INTRAMUSCULAR | Status: DC | PRN

## 2020-05-19 MED ORDER — HYDROMORPHONE HCL 1 MG/ML IJ SOLN: 0.5000 mg | INTRAMUSCULAR | Status: DC | PRN

## 2020-05-19 MED ORDER — PROPOFOL 10 MG/ML IV BOLUS
INTRAVENOUS | Status: DC | PRN
  Administered 2020-05-19: 120 mg via INTRAVENOUS

## 2020-05-19 MED ORDER — LABETALOL HCL 5 MG/ML IV SOLN: 10.0000 mg | INTRAVENOUS | Status: DC | PRN

## 2020-05-19 MED ORDER — MIDAZOLAM HCL 5 MG/5ML IJ SOLN
INTRAMUSCULAR | Status: DC | PRN
  Administered 2020-05-19: 2 mg via INTRAVENOUS

## 2020-05-19 MED ORDER — VANCOMYCIN HCL IN DEXTROSE 1-5 GM/200ML-% IV SOLN
1000.0000 mg | Freq: Two times a day (BID) | INTRAVENOUS | Status: AC
  Administered 2020-05-19 – 2020-05-20 (×2): 1000 mg via INTRAVENOUS
  Filled 2020-05-19 (×2): qty 200

## 2020-05-19 MED ORDER — ACETAMINOPHEN 650 MG RE SUPP: 325.0000 mg | RECTAL | Status: DC | PRN

## 2020-05-19 MED ORDER — CHLORHEXIDINE GLUCONATE 0.12 % MT SOLN
15.0000 mL | Freq: Once | OROMUCOSAL | Status: AC
  Administered 2020-05-19: 15 mL via OROMUCOSAL
  Filled 2020-05-19: qty 15

## 2020-05-19 MED ORDER — POTASSIUM CHLORIDE CRYS ER 20 MEQ PO TBCR: 20.0000 meq | EXTENDED_RELEASE_TABLET | Freq: Every day | ORAL | Status: DC | PRN

## 2020-05-19 MED ORDER — PANTOPRAZOLE SODIUM 40 MG PO TBEC
40.0000 mg | DELAYED_RELEASE_TABLET | Freq: Every day | ORAL | Status: DC
  Administered 2020-05-20 – 2020-05-22 (×3): 40 mg via ORAL
  Filled 2020-05-19 (×3): qty 1

## 2020-05-19 MED ORDER — HEPARIN SODIUM (PORCINE) 1000 UNIT/ML IJ SOLN
INTRAMUSCULAR | Status: DC | PRN
  Administered 2020-05-19: 2000 [IU] via INTRAVENOUS
  Administered 2020-05-19: 9000 [IU] via INTRAVENOUS

## 2020-05-19 MED ORDER — ATORVASTATIN CALCIUM 80 MG PO TABS
80.0000 mg | ORAL_TABLET | Freq: Every day | ORAL | Status: DC
  Administered 2020-05-20 – 2020-05-22 (×3): 80 mg via ORAL
  Filled 2020-05-19 (×3): qty 1

## 2020-05-19 MED ORDER — PAPAVERINE HCL 30 MG/ML IJ SOLN
INTRAMUSCULAR | Status: AC
  Filled 2020-05-19: qty 2

## 2020-05-19 MED ORDER — GUAIFENESIN-DM 100-10 MG/5ML PO SYRP: 15.0000 mL | ORAL_SOLUTION | ORAL | Status: DC | PRN

## 2020-05-19 MED ORDER — ACETAMINOPHEN 500 MG PO TABS
1000.0000 mg | ORAL_TABLET | Freq: Once | ORAL | Status: AC
  Administered 2020-05-19: 1000 mg via ORAL
  Filled 2020-05-19: qty 2

## 2020-05-19 MED ORDER — ALUM & MAG HYDROXIDE-SIMETH 200-200-20 MG/5ML PO SUSP: 15.0000 mL | ORAL | Status: DC | PRN

## 2020-05-19 MED ORDER — ATORVASTATIN CALCIUM 10 MG PO TABS: 20.0000 mg | ORAL_TABLET | Freq: Every day | ORAL | Status: DC

## 2020-05-19 MED ORDER — ASPIRIN EC 81 MG PO TBEC
81.0000 mg | DELAYED_RELEASE_TABLET | Freq: Every day | ORAL | Status: DC
Start: 2020-05-19 — End: 2020-05-22
  Administered 2020-05-19 – 2020-05-22 (×4): 81 mg via ORAL
  Filled 2020-05-19 (×4): qty 1

## 2020-05-19 MED ORDER — OXYCODONE-ACETAMINOPHEN 5-325 MG PO TABS
1.0000 | ORAL_TABLET | ORAL | Status: DC | PRN
  Administered 2020-05-19 – 2020-05-22 (×4): 1 via ORAL
  Filled 2020-05-19: qty 2
  Filled 2020-05-19 (×3): qty 1

## 2020-05-19 MED ORDER — MAGNESIUM SULFATE 2 GM/50ML IV SOLN: 2.0000 g | Freq: Every day | INTRAVENOUS | Status: DC | PRN

## 2020-05-19 MED ORDER — LABETALOL HCL 5 MG/ML IV SOLN
INTRAVENOUS | Status: AC
  Filled 2020-05-19: qty 4

## 2020-05-19 MED ORDER — HEMOSTATIC AGENTS (NO CHARGE) OPTIME
TOPICAL | Status: DC | PRN
  Administered 2020-05-19 (×2): 1 via TOPICAL

## 2020-05-19 MED ORDER — SODIUM CHLORIDE 0.9 % IV SOLN
INTRAVENOUS | Status: DC | PRN
  Administered 2020-05-19: 500 mL

## 2020-05-19 MED ORDER — ONDANSETRON HCL 4 MG/2ML IJ SOLN: 4.0000 mg | Freq: Four times a day (QID) | INTRAMUSCULAR | Status: DC | PRN

## 2020-05-19 MED ORDER — DAPAGLIFLOZIN PROPANEDIOL 10 MG PO TABS
10.0000 mg | ORAL_TABLET | Freq: Every day | ORAL | Status: DC
  Administered 2020-05-20 – 2020-05-22 (×3): 10 mg via ORAL
  Filled 2020-05-19 (×5): qty 1

## 2020-05-19 MED ORDER — VANCOMYCIN HCL IN DEXTROSE 1-5 GM/200ML-% IV SOLN
1000.0000 mg | INTRAVENOUS | Status: AC
  Administered 2020-05-19: 1000 mg via INTRAVENOUS
  Filled 2020-05-19: qty 200

## 2020-05-19 MED ORDER — ROCURONIUM BROMIDE 10 MG/ML (PF) SYRINGE
PREFILLED_SYRINGE | INTRAVENOUS | Status: DC | PRN
  Administered 2020-05-19 (×2): 50 mg via INTRAVENOUS
  Administered 2020-05-19: 30 mg via INTRAVENOUS

## 2020-05-19 MED ORDER — ACETAMINOPHEN 325 MG PO TABS: 325.0000 mg | ORAL_TABLET | ORAL | Status: DC | PRN

## 2020-05-19 MED ORDER — MIDAZOLAM HCL 2 MG/2ML IJ SOLN
INTRAMUSCULAR | Status: AC
  Filled 2020-05-19: qty 2

## 2020-05-19 MED ORDER — LACTATED RINGERS IV SOLN: INTRAVENOUS | Status: DC

## 2020-05-19 MED ORDER — FENTANYL CITRATE (PF) 100 MCG/2ML IJ SOLN
INTRAMUSCULAR | Status: DC | PRN
  Administered 2020-05-19: 100 ug via INTRAVENOUS
  Administered 2020-05-19: 25 ug via INTRAVENOUS
  Administered 2020-05-19: 50 ug via INTRAVENOUS

## 2020-05-19 MED ORDER — PROTAMINE SULFATE 10 MG/ML IV SOLN
INTRAVENOUS | Status: DC | PRN
  Administered 2020-05-19: 50 mg via INTRAVENOUS

## 2020-05-19 MED ORDER — PROPOFOL 10 MG/ML IV BOLUS
INTRAVENOUS | Status: AC
  Filled 2020-05-19: qty 40

## 2020-05-19 MED ORDER — ALBUTEROL SULFATE HFA 108 (90 BASE) MCG/ACT IN AERS
2.0000 | INHALATION_SPRAY | Freq: Four times a day (QID) | RESPIRATORY_TRACT | Status: DC | PRN
  Filled 2020-05-19: qty 6.7

## 2020-05-19 MED ORDER — ACETAMINOPHEN 325 MG PO TABS: 325.0000 mg | ORAL_TABLET | Freq: Once | ORAL | Status: DC | PRN

## 2020-05-19 MED ORDER — SODIUM CHLORIDE 0.9 % IV SOLN: 500.0000 mL | Freq: Once | INTRAVENOUS | Status: DC | PRN

## 2020-05-19 MED ORDER — HEPARIN SODIUM (PORCINE) 5000 UNIT/ML IJ SOLN
5000.0000 [IU] | Freq: Three times a day (TID) | INTRAMUSCULAR | Status: DC
  Administered 2020-05-20 – 2020-05-22 (×7): 5000 [IU] via SUBCUTANEOUS
  Filled 2020-05-19 (×7): qty 1

## 2020-05-19 MED ORDER — PHENOL 1.4 % MT LIQD: 1.0000 | OROMUCOSAL | Status: DC | PRN

## 2020-05-19 MED ORDER — ORAL CARE MOUTH RINSE: 15.0000 mL | Freq: Once | OROMUCOSAL | Status: AC

## 2020-05-19 MED ORDER — ROCURONIUM BROMIDE 10 MG/ML (PF) SYRINGE
PREFILLED_SYRINGE | INTRAVENOUS | Status: AC
  Filled 2020-05-19: qty 20

## 2020-05-19 MED ORDER — HYDRALAZINE HCL 20 MG/ML IJ SOLN: 5.0000 mg | INTRAMUSCULAR | Status: DC | PRN

## 2020-05-19 MED ORDER — ONDANSETRON HCL 4 MG/2ML IJ SOLN
INTRAMUSCULAR | Status: AC
  Filled 2020-05-19: qty 2

## 2020-05-19 MED ORDER — DOCUSATE SODIUM 100 MG PO CAPS
100.0000 mg | ORAL_CAPSULE | Freq: Every day | ORAL | Status: DC
  Administered 2020-05-20: 100 mg via ORAL
  Filled 2020-05-19 (×3): qty 1

## 2020-05-19 MED ORDER — KETAMINE HCL 50 MG/5ML IJ SOSY
PREFILLED_SYRINGE | INTRAMUSCULAR | Status: AC
  Filled 2020-05-19: qty 5

## 2020-05-19 SURGICAL SUPPLY — 75 items
BANDAGE ESMARK 6X9 LF (GAUZE/BANDAGES/DRESSINGS) IMPLANT
BLADE CLIPPER SURG (BLADE) IMPLANT
BNDG ESMARK 6X9 LF (GAUZE/BANDAGES/DRESSINGS)
BRUSH SCRUB EZ PLAIN DRY (MISCELLANEOUS) ×6 IMPLANT
CANISTER SUCT 3000ML PPV (MISCELLANEOUS) ×3 IMPLANT
CLIP FOGARTY SPRING 6M (CLIP) IMPLANT
CLIP VESOCCLUDE MED 24/CT (CLIP) ×3 IMPLANT
CLIP VESOCCLUDE SM WIDE 24/CT (CLIP) ×3 IMPLANT
COVER PROBE W GEL 5X96 (DRAPES) IMPLANT
COVER SURGICAL LIGHT HANDLE (MISCELLANEOUS) ×3 IMPLANT
COVER WAND RF STERILE (DRAPES) IMPLANT
CUFF TOURN SGL QUICK 24 (TOURNIQUET CUFF)
CUFF TOURN SGL QUICK 34 (TOURNIQUET CUFF)
CUFF TOURN SGL QUICK 42 (TOURNIQUET CUFF) IMPLANT
CUFF TRNQT CYL 24X4X16.5-23 (TOURNIQUET CUFF) IMPLANT
CUFF TRNQT CYL 34X4.125X (TOURNIQUET CUFF) IMPLANT
DERMABOND ADVANCED (GAUZE/BANDAGES/DRESSINGS) ×2
DERMABOND ADVANCED .7 DNX12 (GAUZE/BANDAGES/DRESSINGS) ×4 IMPLANT
DRAIN CHANNEL 15F RND FF W/TCR (WOUND CARE) IMPLANT
DRAPE C-ARM 42X72 X-RAY (DRAPES) IMPLANT
DRAPE HALF SHEET 40X57 (DRAPES) ×3 IMPLANT
DRAPE INCISE IOBAN 66X45 STRL (DRAPES) ×3 IMPLANT
ELECT REM PT RETURN 9FT ADLT (ELECTROSURGICAL) ×3
ELECTRODE REM PT RTRN 9FT ADLT (ELECTROSURGICAL) ×2 IMPLANT
EVACUATOR SILICONE 100CC (DRAIN) IMPLANT
GAUZE 4X4 16PLY RFD (DISPOSABLE) ×3 IMPLANT
GAUZE SPONGE 4X4 12PLY STRL (GAUZE/BANDAGES/DRESSINGS) ×3 IMPLANT
GLOVE BIO SURGEON STRL SZ7.5 (GLOVE) ×3 IMPLANT
GLOVE BIOGEL PI IND STRL 6 (GLOVE) ×2 IMPLANT
GLOVE BIOGEL PI IND STRL 8 (GLOVE) ×2 IMPLANT
GLOVE BIOGEL PI INDICATOR 6 (GLOVE) ×1
GLOVE BIOGEL PI INDICATOR 8 (GLOVE) ×1
GLOVE SURG SS PI 6.5 STRL IVOR (GLOVE) ×3 IMPLANT
GLOVE SURG UNDER POLY LF SZ6.5 (GLOVE) ×3 IMPLANT
GOWN STRL REUS W/ TWL LRG LVL3 (GOWN DISPOSABLE) ×4 IMPLANT
GOWN STRL REUS W/ TWL XL LVL3 (GOWN DISPOSABLE) ×4 IMPLANT
GOWN STRL REUS W/TWL LRG LVL3 (GOWN DISPOSABLE) ×2
GOWN STRL REUS W/TWL XL LVL3 (GOWN DISPOSABLE) ×2
GRAFT PROPATEN W/RING 6X80X60 (Vascular Products) ×3 IMPLANT
HEMOSTAT SNOW SURGICEL 2X4 (HEMOSTASIS) ×3 IMPLANT
HEMOSTAT SPONGE AVITENE ULTRA (HEMOSTASIS) IMPLANT
INSERT FOGARTY SM (MISCELLANEOUS) IMPLANT
KIT BASIN OR (CUSTOM PROCEDURE TRAY) ×3 IMPLANT
KIT TURNOVER KIT B (KITS) ×3 IMPLANT
LOOP VESSEL MINI RED (MISCELLANEOUS) ×6 IMPLANT
MARKER SKIN DUAL TIP RULER LAB (MISCELLANEOUS) ×3 IMPLANT
NS IRRIG 1000ML POUR BTL (IV SOLUTION) ×6 IMPLANT
PACK PERIPHERAL VASCULAR (CUSTOM PROCEDURE TRAY) ×3 IMPLANT
PAD ARMBOARD 7.5X6 YLW CONV (MISCELLANEOUS) ×6 IMPLANT
PATCH VASC XENOSURE 1CMX6CM (Vascular Products) ×1 IMPLANT
PATCH VASC XENOSURE 1X6 (Vascular Products) ×2 IMPLANT
SET MICROPUNCTURE 5F STIFF (MISCELLANEOUS) IMPLANT
SPONGE LAP 18X18 RF (DISPOSABLE) ×3 IMPLANT
STOPCOCK 4 WAY LG BORE MALE ST (IV SETS) IMPLANT
SUT ETHILON 3 0 PS 1 (SUTURE) IMPLANT
SUT GORETEX 5 0 TT13 24 (SUTURE) IMPLANT
SUT GORETEX 6.0 TT13 (SUTURE) IMPLANT
SUT MNCRL AB 4-0 PS2 18 (SUTURE) ×9 IMPLANT
SUT PROLENE 5 0 C 1 24 (SUTURE) ×27 IMPLANT
SUT PROLENE 6 0 BV (SUTURE) ×6 IMPLANT
SUT PROLENE 7 0 BV 1 (SUTURE) IMPLANT
SUT SILK 2 0 PERMA HAND 18 BK (SUTURE) ×3 IMPLANT
SUT SILK 3 0 (SUTURE)
SUT SILK 3-0 18XBRD TIE 12 (SUTURE) IMPLANT
SUT VIC AB 2-0 CT1 27 (SUTURE) ×5
SUT VIC AB 2-0 CT1 TAPERPNT 27 (SUTURE) ×10 IMPLANT
SUT VIC AB 3-0 SH 27 (SUTURE) ×3
SUT VIC AB 3-0 SH 27X BRD (SUTURE) ×6 IMPLANT
SYR BULB IRRIG 60ML STRL (SYRINGE) ×3 IMPLANT
TAPE UMBILICAL COTTON 1/8X30 (MISCELLANEOUS) IMPLANT
TOWEL GREEN STERILE (TOWEL DISPOSABLE) ×3 IMPLANT
TRAY FOLEY MTR SLVR 16FR STAT (SET/KITS/TRAYS/PACK) ×3 IMPLANT
TUBING EXTENTION W/L.L. (IV SETS) IMPLANT
UNDERPAD 30X36 HEAVY ABSORB (UNDERPADS AND DIAPERS) ×3 IMPLANT
WATER STERILE IRR 1000ML POUR (IV SOLUTION) ×3 IMPLANT

## 2020-05-19 NOTE — Evaluation (Signed)
Occupational Therapy Evaluation Patient Details Name: Christina Lamb MRN: [id number] DOB: 1960/08/25 Today's Date: 05/19/2020    History of Present Illness 60 y.o. female, with diabetes that presents for evaluation of PAD and right foot ulcer.  She has had an ulcer on the right foot for approximately 20 weeks.  She states she has been to a number of different doctors including wound clinics and this has failed to heal with multiple treatments. She underwent a Right common femoral endarterectomy with profundoplasty and bovine pericardial patch angioplasty  2.  Right common femoral to above-knee popliteal artery bypass with 6 mm ringed PTFE.   Clinical Impression   Patient admitted for the above diagnosis and procedure.  She is doing quite well.  She expresses soreness in the R groin, and is moving with Min A for bed mobility and basic transfers.  She is very independent at home, and continues to work full time.  It is expected she will quickly regain her independence as surgical soreness declines.  No post acute needs anticipated.  OT will follow in the acute setting.  Patient is very engaged, and wants to know what she is allowed to do.      Follow Up Recommendations  No OT follow up    Equipment Recommendations  None recommended by OT    Recommendations for Other Services       Precautions / Restrictions Precautions Precautions: Fall Precaution Comments: A- line intact to L wrist.  Nursing not using for blood pressures. Restrictions Weight Bearing Restrictions: No Other Position/Activity Restrictions: small open area under her pinky toe R foot.      Mobility Bed Mobility Overal bed mobility: Needs Assistance Bed Mobility: Supine to Sit;Sit to Supine     Supine to sit: Min assist Sit to supine: Min assist        Transfers Overall transfer level: Needs assistance   Transfers: Sit to/from Stand Sit to Stand: Min assist         General transfer comment: HHA for side  steps to the head of the bed    Balance Overall balance assessment: Needs assistance Sitting-balance support: No upper extremity supported Sitting balance-Leahy Scale: Good     Standing balance support: Bilateral upper extremity supported Standing balance-Leahy Scale: Fair                             ADL either performed or assessed with clinical judgement   ADL    Min A with LB ADL.  Very close to baseline.                                       General ADL Comments: Min A to don/doff sock to R foot.     Vision Patient Visual Report: No change from baseline       Perception     Praxis      Pertinent Vitals/Pain Pain Assessment: Faces Faces Pain Scale: Hurts a little bit Pain Location: R groin Pain Descriptors / Indicators: Tender Pain Intervention(s): Monitored during session     Hand Dominance Right   Extremity/Trunk Assessment Upper Extremity Assessment Upper Extremity Assessment: Overall WFL for tasks assessed   Lower Extremity Assessment Lower Extremity Assessment: Defer to PT evaluation   Cervical / Trunk Assessment Cervical / Trunk Assessment: Normal   Communication Communication Communication: No difficulties  Cognition Arousal/Alertness: Awake/alert Behavior During Therapy: WFL for tasks assessed/performed Overall Cognitive Status: Within Functional Limits for tasks assessed                                     General Comments   VSS on RA    Exercises     Shoulder Instructions      Home Living Family/patient expects to be discharged to:: Private residence Living Arrangements: Parent Available Help at Discharge: Family;Available 24 hours/day Type of Home: House Home Access: Stairs to enter [employer] of Steps: 1   Home Layout: One level     Bathroom Shower/Tub: Chief Strategy Officer: Standard Bathroom Accessibility: Yes How Accessible: Accessible via walker Home  Equipment: Bedside commode;Grab bars - tub/shower;Shower seat   Additional Comments: DME is patient's mother whom stays with the patient.  she is 60yo      Prior Functioning/Environment Level of Independence: Independent        Comments: Works full time.  No assist with any care.  Mother remains active and helps with home mangement, meals.        OT Problem List: Impaired balance (sitting and/or standing);Pain;Decreased range of motion      OT Treatment/Interventions: Self-care/ADL training;Balance training;Therapeutic activities    OT Goals(Current goals can be found in the care plan section) Acute Rehab OT Goals Patient Stated Goal: I hope I can get home by Sunday OT Goal Formulation: With patient Time For Goal Achievement: 06/02/20 Potential to Achieve Goals: Good  OT Frequency: Min 2X/week   Barriers to D/C:    none       Co-evaluation              AM-PAC OT "6 Clicks" Daily Activity     Outcome Measure Help from another person eating meals?: None Help from another person taking care of personal grooming?: A Little Help from another person toileting, which includes using toliet, bedpan, or urinal?: A Little Help from another person bathing (including washing, rinsing, drying)?: A Little Help from another person to put on and taking off regular upper body clothing?: None Help from another person to put on and taking off regular lower body clothing?: A Little 6 Click Score: 20   End of Session Nurse Communication: Mobility status  Activity Tolerance: Patient tolerated treatment well Patient left: in bed;with call bell/phone within reach  OT Visit Diagnosis: Pain Pain - Right/Left: Right                Time: 1715-1735 OT Time Calculation (min): 20 min Charges:  OT General Charges $OT Visit: 1 Visit OT Evaluation $OT Eval Moderate Complexity: 1 Mod  05/19/2020  Rich, OTR/L  Acute Rehabilitation Services  Office:  [phone]   Suzanna Obey 05/19/2020, 5:47 PM

## 2020-05-19 NOTE — Progress Notes (Signed)
PHARMACIST LIPID MONITORING   Christina Lamb is a 60 y.o. female admitted on 05/19/2020 with PVD.  Pharmacy has been consulted to optimize lipid-lowering therapy with the indication of secondary prevention for clinical ASCVD.  Recent Labs:  Lipid Panel (last 6 months):   No results found for: CHOL, TRIG, HDL, CHOLHDL, VLDL, LDLCALC, LDLDIRECT  Hepatic function panel (last 6 months):   Lab Results  Component Value Date   AST 32 05/16/2020   ALT 28 05/16/2020   ALKPHOS 76 05/16/2020   BILITOT 0.8 05/16/2020    SCr (since admission):   Serum creatinine: 1.07 mg/dL (H) 96/28/36 6294 Estimated creatinine clearance: 64.5 mL/min (A)  Current lipid-lowering therapy: atorvastatin 20mg /day Previous lipid-lowering therapies (if applicable): none Documented or reported allergies or intolerances to lipid-lowering therapies (if applicable): none  Assessment:  Patient agrees with changes to lipid-lowering therapy  Recommendation per protocol:  Increase intensity or dose of current statin.  Follow-up with:  Primary care provider - , MD  Follow-up labs after discharge:    Lipid panel in 8-12 weeks then annually  Plan: -Change atorvastatin to 80mg /day  10-12, PharmD Clinical Pharmacist **Pharmacist phone directory can now be found on [email or web address] ([email or web address]).  Listed under [facility].

## 2020-05-19 NOTE — Discharge Instructions (Signed)
 [facility]  Discharge instructions  Lower Extremity Bypass Surgery  Please refer to the following instruction for your post-procedure care. Your surgeon or physician assistant will discuss any changes with you.  Activity  You are encouraged to walk as much as you can. You can slowly return to normal activities during the month after your surgery. Avoid strenuous activity and heavy lifting until your doctor tells you it's OK. Avoid activities such as vacuuming or swinging a golf club. Do not drive until your doctor give the OK and you are no longer taking prescription pain medications. It is also normal to have difficulty with sleep habits, eating and bowel movement after surgery. These will go away with time.  Bathing/Showering  Shower daily after you go home. Do not soak in a bathtub, hot tub, or swim until the incision heals completely.  Incision Care  Clean your incision with mild soap and water. Shower every day. Pat the area dry with a clean towel. You do not need a bandage unless otherwise instructed. Do not apply any ointments or creams to your incision. If you have open wounds you will be instructed how to care for them or a visiting nurse may be arranged for you. If you have staples or sutures along your incision they will be removed at your post-op appointment. You may have skin glue on your incision. Do not peel it off. It will come off on its own in about one week.  Wash the groin wound with soap and water daily and pat dry. (No tub bath-only shower)  Then put a dry gauze or washcloth in the groin to keep this area dry to help prevent wound infection.  Do this daily and as needed.  Do not use Vaseline or neosporin on your incisions.  Only use soap and water on your incisions and then protect and keep dry.  Diet  Resume your normal diet. There are no special food restrictions following this procedure. A low fat/ low cholesterol diet is  recommended for all patients with vascular disease. In order to heal from your surgery, it is CRITICAL to get adequate nutrition. Your body requires vitamins, minerals, and protein. Vegetables are the best source of vitamins and minerals. Vegetables also provide the perfect balance of protein. Processed food has little nutritional value, so try to avoid this.  Medications  Resume taking all your medications unless your doctor or physician assistant tells you not to. If your incision is causing pain, you may take over-the-counter pain relievers such as acetaminophen (Tylenol). If you were prescribed a stronger pain medication, please aware these medication can cause nausea and constipation. Prevent nausea by taking the medication with a snack or meal. Avoid constipation by drinking plenty of fluids and eating foods with high amount of fiber, such as fruits, vegetables, and grains. Take Colace 100 mg (an over-the-counter stool softener) twice a day as needed for constipation.  Do not take Tylenol if you are taking prescription pain medications.  Follow Up  Our office will schedule a follow up appointment 2-3 weeks following discharge.  Please call us immediately for any of the following conditions  Severe or worsening pain in your legs or feet while at rest or while walking Increase pain, redness, warmth, or drainage (pus) from your incision site(s) Fever of 101 degree or higher The swelling in your leg with the bypass suddenly worsens and becomes more painful than when you were in the hospital If you have   been instructed to feel your graft pulse then you should do so every day. If you can no longer feel this pulse, call the office immediately. Not all patients are given this instruction.  Leg swelling is common after leg bypass surgery.  The swelling should improve over a few months following surgery. To improve the swelling, you may elevate your legs above the level of your heart while you are  sitting or resting. Your surgeon or physician assistant may ask you to apply an ACE wrap or wear compression (TED) stockings to help to reduce swelling.  Reduce your risk of vascular disease  Stop smoking. If you would like help call [facility] at 1-800-QUIT-NOW (1-800-784-8669) or [place] at 336-586-4000.  Manage your cholesterol Maintain a desired weight Control your diabetes weight Control your diabetes Keep your blood pressure down  If you have any questions, please call the office at [phone]  

## 2020-05-19 NOTE — Anesthesia Postprocedure Evaluation (Signed)
Anesthesia Post Note  Patient: Christina Lamb  Procedure(s) Performed: RIGHT COMMON FEMORAL TO ABOVE KNEE POPLITEAL BYPASS GRAFTING WITH PROPATEN (Right Leg Upper) PATCH ANGIOPLASTY (Right Leg Upper) RIGHT ENDARTERECTOMY COMMON FEMORAL WITH PROFUNDAPLASTY (Right Groin)     Patient location during evaluation: PACU Anesthesia Type: General Level of consciousness: awake and alert Pain management: pain level controlled Vital Signs Assessment: post-procedure vital signs reviewed and stable Respiratory status: spontaneous breathing, nonlabored ventilation, respiratory function stable and patient connected to nasal cannula oxygen Cardiovascular status: blood pressure returned to baseline and stable Postop Assessment: no apparent nausea or vomiting Anesthetic complications: no   No complications documented.  Last Vitals:  Vitals:   05/19/20 1329 05/19/20 1330  BP: 132/67 132/67  Pulse: 77 76  Resp: 18 17  Temp: 36.4 C   SpO2: 99% 99%    Last Pain:  Vitals:   05/19/20 1330  TempSrc:   PainSc: 0-No pain                 Shelton Silvas

## 2020-05-19 NOTE — Anesthesia Procedure Notes (Addendum)
Arterial Line Insertion Start/End1/14/2022 7:56 AM, 05/19/2020 7:57 AM Performed by: Shelton Silvas, MD, anesthesiologist  Patient location: OR. Preanesthetic checklist: patient identified, IV checked, site marked, risks and benefits discussed, surgical consent, monitors and equipment checked, pre-op evaluation, timeout performed and anesthesia consent Lidocaine 1% used for infiltration Left, radial was placed Catheter size: 20 Fr Hand hygiene performed  and maximum sterile barriers used   Attempts: 1 Procedure performed without using ultrasound guided technique. Following insertion, dressing applied and Biopatch. Post procedure assessment: normal and unchanged  Patient tolerated the procedure well with no immediate complications.

## 2020-05-19 NOTE — Op Note (Addendum)
Date: May 19, 2020  Preoperative diagnosis: Critical limb ischemia of the right lower extremity with tissue loss  Postoperative diagnosis: Same  Procedure: 1.  Right common femoral endarterectomy with profundoplasty and bovine pericardial patch angioplasty 2.  Right common femoral to above-knee popliteal artery bypass with 6 mm ringed PTFE  Surgeon: Dr. Cephus Shelling, MD  Assistant: Nathanial Rancher, PA  Indications: Patient is a 60 year old female who was seen in the office with a ulcer on her right foot that has been nonhealing.  She was found to have a toe pressure of 0 with depressed ABI's.  Lower extremity arteriogram showed flush right SFA occlusion.  She presents today for planned bypass after risk benefits discussed.  An assistant was needed for exposure and to expedite the case.  Findings: The right common femoral artery was heavily calcified with greater than 50% stenosis and this was endarterectomized with eversion endarterectomy of the profunda and I also endarterectomized the proximal SFA to create endovascular options for the future.  The common femoral was patched with bovine pericardium with improved femoral pulse.  Ultimately 6 mm ringed PTFE bypass with sewn from the common femoral to the above-knee popliteal artery given she had no usable vein.  This was tunneled subfascial and subsartorial.  Excellent dorsalis pedis signal at completion with a palpable femoral pulse and a palpable pulse in the bypass.  Anesthesia: General  EBL: 200 mL  Details: Patient was taken to the operating room after informed consent was obtained.  She was placed on the operative table in supine position.  Endotracheal anesthesia was induced.  Ultimately we did have to tape her pannus back in order to adequately visualize her groin and I marked the artery above the inguinal ligament.  The right groin and right leg were prepped draped usual sterile fashion.  Timeout was performed antibiotics  were given.  I made a horizontal groin incision above the inguinal crease.  Dissected down with Bovie cautery to open the subcutaneous tissue and when encountered the femoral sheath this was opened longitudinally.  Dissected out the SFA and profunda as well as under the inguinal ligament and branches were controlled with Vesseloops.  Common femoral was heavily calcified and there was a good pulse in the external iliac but a very weak pulse in the common femoral and it became apparent the patient would need a femoral endarterectomy.  I then went down to the above-knee popliteal exposure and a longitudinal incision was made anterior to sartorius on the medial distal thigh.  I dissected down anterior to sartorius and this was reflected posteriorly until I encountered the popliteal fossa and I dissected the artery away from the veins.  In my initial exposure the artery was heavily calcified and ultimately had to extend my incision to get more distal on the artery where it was soft and I could actually sew anastomosis.  I then used a Gore tunneler and tunneled from the above-knee popliteal exposure to the groin in the subfascial subsartorial space.  We passed a 6 mm ringed PTFE graft through the tunneler and made sure to keep it orientation proper.  Patient was then given 100 units/kh heparin.  ACTs were checked to maintain greater than 250.  I used Vesseloops on the SFA profunda and then a Henley clamp on the distal external iliac.  Common femoral was opened with 11 blade scalpel extended with Potts scissors.  I used Runner, broadcasting/film/video and endarterectomy was performed of the common femoral artery with eversion endarterectomy  of the profunda and I endarterectomized the proximal SFA stump.  We had excellent inflow and good backbleeding from the profunda.  Bovine pericardial patch was brought in the field and seen to the right common femoral arteriotomy with a 5-0 Prolene in parachute technique.  I did have to place one  pledgeted suture on the lateral wall of the artery where the endarterectomy was thin.  That point in time I then had already tunneled my graft and I used again the Henly clamp for control proximally and the patch was opened with 11 blade scalpel extended with Potts scissors and I beveled the graft and anastomosis was sewn with a 5-0 Prolene from the 6 mm ringed PTFE graft onto the common femoral artery.  We had excellent pulsatile flow distally with good pulse in the graft.  That point time we then used Vesseloops to control the above-knee popliteal artery this was opened with 11 blade scalpel extended with Potts scissors.  I straighten the graft and ensured I had good one-to-one tension.  I then cut it to the appropriate length and beveled it and the end to side anastomosis was sewn from the graft onto the above-knee popliteal artery with 5-0 Prolene.  We did de-air everything prior to completion.  We had excellent triphasic signal in the above-knee popliteal artery distal to the graft and a very good dorsalis pedis signal and also triphasic flow in the profunda in the groin.  Patient was given 50 mg protamine for reversal.  All incisions were irrigated out and closed in multiple layers of 2-0 Vicryl, 3-0 Vicryl, 4-0 Monocryl, Dermabond.  We had a good dorsalis pedis signal again at the end of the case.  She tolerated procedure with no apparent complications.  Complication: None  Condition: Stable  Cephus Shelling, MD [facility] Office: [phone]   Cephus Shelling

## 2020-05-19 NOTE — Anesthesia Preprocedure Evaluation (Addendum)
Anesthesia Evaluation  Patient identified by MRN, date of birth, ID band Patient awake    Reviewed: Allergy & Precautions, NPO status , Patient's Chart, lab work & pertinent test results  Airway Mallampati: III  TM Distance: >3 FB Neck ROM: Full    Dental  (+) Teeth Intact, Dental Advisory Given   Pulmonary former smoker,    breath sounds clear to auscultation       Cardiovascular hypertension,  Rhythm:Regular Rate:Normal     Neuro/Psych    GI/Hepatic   Endo/Other  diabetes  Renal/GU      Musculoskeletal   Abdominal Normal abdominal exam  (+)   Peds  Hematology   Anesthesia Other Findings   Reproductive/Obstetrics                            Anesthesia Physical Anesthesia Plan  ASA: II  Anesthesia Plan: General   Post-op Pain Management:    Induction: Intravenous  PONV Risk Score and Plan: 4 or greater and Ondansetron, Dexamethasone, Midazolam and Scopolamine patch - Pre-op  Airway Management Planned: Oral ETT  Additional Equipment: Arterial line  Intra-op Plan:   Post-operative Plan: Extubation in OR  Informed Consent: I have reviewed the patients History and Physical, chart, labs and discussed the procedure including the risks, benefits and alternatives for the proposed anesthesia with the patient or authorized representative who has indicated his/her understanding and acceptance.     Dental advisory given  Plan Discussed with: CRNA  Anesthesia Plan Comments:         Anesthesia Quick Evaluation

## 2020-05-19 NOTE — Progress Notes (Signed)
Pharmacy note: Home medications  60 yo female s/p endarterectomy and fem pop bypass. She was noted on canagliflozin/metformin and dapagliflozin PTA.  -dapagliflozin and canagliflozin  are both SGTL2s. These medications were prescribed by from different MDs; filled 112/27/21 and 05/08/20 respectively -the patient confirmed she is taking both  Plan -Recommend continuing dapagliflozin as inpatient -On discharge would continue canagliflozin/metformin and discontinue dapagliflozin and have her follow-up with her outpatient provider -Modifications have been made to the discharge medication list and I also spoke with the patient   Harland German, PharmD Clinical Pharmacist **Pharmacist phone directory can now be found on [email or web address] ([email or web address]).  Listed under [facility].

## 2020-05-19 NOTE — Anesthesia Procedure Notes (Signed)
Procedure Name: Intubation Date/Time: 05/19/2020 7:56 AM Performed by: Lytle Michaels, CRNA Pre-anesthesia Checklist: Patient identified, Emergency Drugs available, Suction available and Patient being monitored Patient Re-evaluated:Patient Re-evaluated prior to induction Oxygen Delivery Method: Circle system utilized Preoxygenation: Pre-oxygenation with 100% oxygen Induction Type: IV induction Ventilation: Mask ventilation without difficulty Laryngoscope Size: Miller and 2 Grade View: Grade I Tube type: Oral Tube size: 7.0 mm Number of attempts: 1 Airway Equipment and Method: Stylet and Oral airway Placement Confirmation: ETT inserted through vocal cords under direct vision,  positive ETCO2 and breath sounds checked- equal and bilateral Tube secured with: Tape Dental Injury: Teeth and Oropharynx as per pre-operative assessment

## 2020-05-19 NOTE — H&P (Signed)
History and Physical Interval Note:  05/19/2020 7:28 AM  Christina Lamb  has presented today for surgery, with the diagnosis of PAD.  The various methods of treatment have been discussed with the patient and family. After consideration of risks, benefits and other options for treatment, the patient has consented to  Procedure(s): RIGHT FEMORAL TO POPLITEAL BYPASS GRAFTING (Right) as a surgical intervention.  The patient's history has been reviewed, patient examined, no change in status, stable for surgery.  I have reviewed the patient's chart and labs.  Questions were answered to the patient's satisfaction.    Plan right femoral endarterectomy and fem pop bypass, likely above knee pop target.  No vein on vein mapping.  Risks and benefits discussed.  Christina Lamb  REASON FOR CONSULT:Right foot ulcer  HPI: Christina Lamb a 60 y.o.female,with diabetes thatpresents for evaluation of PAD and right foot ulcer. She has had anulcer on the right foot for approximately 20 weeks. She states shehas been to a number of different doctors including wound clinics and thishas failed to heal with multiple treatments. She states she had wounds on her left foot that have healed immediately. She denies any previous lower extremity revascularizations. She used to smoke but has since quit.She is ambulatory.She is currently walkingwith aDarco shoe and states she has a lot of pain in the foot when she is walking.      Past Medical History:  Diagnosis Date  . Diabetes mellitus without complication [facility])          Past Surgical History:  Procedure Laterality Date  . ACHILLES TENDON REPAIR           Family History  Problem Relation Age of Onset  . Heart disease Father     SOCIAL HISTORY: Social History        Socioeconomic History  . Marital status: Single    Spouse name: Not on file  . Number of children: Not on file  . Years of education: Not on  file  . Highest education level: Not on file  Occupational History  . Not on file  Tobacco Use  . Smoking status: Former Smoker    Packs/day: 0.50    Years: 15.00    Pack years: 7.50    Types: Cigarettes    Quit date: 2018    Years since quitting: 3.9  . Smokeless tobacco: Never Used  Substance and Sexual Activity  . Alcohol use: Never  . Drug use: Never  . Sexual activity: Not on file  Other Topics Concern  . Not on file  Social History Narrative  . Not on file   Social Determinants of Health   Financial Resource Strain: Not on file  Food Insecurity: Not on file  Transportation Needs: Not on file  Physical Activity: Not on file  Stress: Not on file  Social Connections: Not on file  Intimate Partner Violence: Not on file        Allergies  Allergen Reactions  . Hydromet [Hydrocodone-Homatropine]   . Penicillins Swelling  . Codeine Nausea And Vomiting          Current Outpatient Medications  Medication Sig Dispense Refill  . albuterol (PROVENTIL HFA;VENTOLIN HFA) 108 (90 Base) MCG/ACT inhaler Inhale into the lungs every 6 (six) hours as needed for wheezing or shortness of breath.    Marland Kitchen aspirin EC 81 MG tablet Take 81 mg by mouth daily.    Marland Kitchen glimepiride (AMARYL) 4 MG tablet Take 1 tablet by mouth daily.    Marland Kitchen  INVOKAMET [phone] MG TABS Take 1 tablet by mouth 2 (two) times daily.  4  . lisinopril (PRINIVIL,ZESTRIL) 20 MG tablet Take 20 mg by mouth daily.     Marland Kitchen omega-3 acid ethyl esters (LOVAZA) 1 g capsule Take by mouth 2 (two) times daily.     No current facility-administered medications for this visit.    REVIEW OF SYSTEMS: [X]  denotes positive finding, [ ]  denotes negative finding Cardiac  Comments:  Chest pain or chest pressure:    Shortness of breath upon exertion:    Short of breath when lying flat:    Irregular heart rhythm:        Vascular    Pain in calf, thigh, or hip brought on by ambulation:     Pain in feet at night that wakes you up from your sleep:     Blood clot in your veins:    Leg swelling:         Pulmonary    Oxygen at home:    Productive cough:     Wheezing:         Neurologic    Sudden weakness in arms or legs:     Sudden numbness in arms or legs:     Sudden onset of difficulty speaking or slurred speech:    Temporary loss of vision in one eye:     Problems with dizziness:         Gastrointestinal    Blood in stool:     Vomited blood:         Genitourinary    Burning when urinating:     Blood in urine:        Psychiatric    Major depression:         Hematologic    Bleeding problems:    Problems with blood clotting too easily:        Skin    Rashes or ulcers:        Constitutional    Fever or chills:      PHYSICAL EXAM:    Vitals:   04/25/20 1529  BP: 104/69  Pulse: 88  Resp: 16  Temp: 97.8 F (36.6 C)  TempSrc: Temporal  SpO2: 95%  Weight: 217 lb (98.4 kg)  Height: 5' 4.5" (1.638 m)    GENERAL: The patient is a well-nourishedfemale, in no acute distress. The vital signs are documented above. CARDIAC:There is a regular rate and rhythm.  VASCULAR: Palpable femoral pulses bilaterally Left DP palpable Right pedal pulses nonpalpable Right fifth metatarsal ulcer as pictured below PULMONARY:No respiratory distress. ABDOMEN:Soft and non-tender. MUSCULOSKELETAL:There are no major deformities or cyanosis. NEUROLOGIC:No focal weakness or paresthesias are detected. SKIN:There are no ulcers or rashes noted. PSYCHIATRIC:The patient has a normal affect.      DATA:  ABIstoday are 0.51 on the right monophasic with a toe pressure of 0. On the left her ABIs 0.75 and triphasic at the ankle.  Assessment/Plan:  60 year old female presents with critical limb ischemia of the right lower extremity with tissue loss. She has an  ulceronthe right fifth metatarsal head now for 20 weeks. Discussed with her that she has severely depressed ABI of 0.5 with a monophasic waveform and a toe pressure of 0 which is certainly inadequate for wound healing. I discussed that she will need aortogram lower extremity arteriogram and possible intervention. Previous arterial duplex in 2019 did show a right SFA occlusion and we discussed that she may ultimately require bypass at a later date.  I am out of town next week and offered to get herscheduled with one ofmy partners for arteriogram but she wants to wait until the week after when I have returned. I will get her scheduled today. Risks andbenefits of arteriogram were discussed in detail.   Christina Shelling, MD [facility] Office: [phone]

## 2020-05-19 NOTE — Transfer of Care (Signed)
Immediate Anesthesia Transfer of Care Note  Patient: Christina Lamb  Procedure(s) Performed: RIGHT COMMON FEMORAL TO ABOVE KNEE POPLITEAL BYPASS GRAFTING WITH PROPATEN (Right Leg Upper) PATCH ANGIOPLASTY (Right Leg Upper) RIGHT ENDARTERECTOMY COMMON FEMORAL WITH PROFUNDAPLASTY (Right Groin)  Patient Location: PACU  Anesthesia Type:General  Level of Consciousness: awake, drowsy and patient cooperative  Airway & Oxygen Therapy: Patient Spontanous Breathing and Patient connected to face mask oxygen  Post-op Assessment: Report given to RN, Post -op Vital signs reviewed and unstable, Anesthesiologist notified and Patient moving all extremities  Post vital signs: Reviewed and stable  Last Vitals:  Vitals Value Taken Time  BP 125/65 05/19/20 1145  Temp    Pulse 73 05/19/20 1147  Resp 23 05/19/20 1147  SpO2 97 % 05/19/20 1147  Vitals shown include unvalidated device data.  Last Pain:  Vitals:   05/19/20 0612  TempSrc:   PainSc: 0-No pain         Complications: No complications documented.

## 2020-05-19 NOTE — Telephone Encounter (Signed)
05/19/20 All FMLA paper work was completed with notes and faxed to patients human resources Attention Jennefer Bravo [phone].   [employer]

## 2020-05-20 DIAGNOSIS — I70235 Atherosclerosis of native arteries of right leg with ulceration of other part of foot: Secondary | ICD-10-CM | POA: Diagnosis not present

## 2020-05-20 LAB — BASIC METABOLIC PANEL
Anion gap: 10 (ref 5–15)
BUN: 10 mg/dL (ref 6–20)
CO2: 20 mmol/L — ABNORMAL LOW (ref 22–32)
Calcium: 8.2 mg/dL — ABNORMAL LOW (ref 8.9–10.3)
Chloride: 106 mmol/L (ref 98–111)
Creatinine, Ser: 0.79 mg/dL (ref 0.44–1.00)
GFR, Estimated: >60 mL/min (ref >60)
Glucose, Bld: 199 mg/dL — ABNORMAL HIGH (ref 70–99)
Potassium: 4.2 mmol/L (ref 3.5–5.1)
Sodium: 136 mmol/L (ref 135–145)

## 2020-05-20 LAB — LIPID PANEL
Cholesterol: 125 mg/dL (ref 0–200)
HDL: 41 mg/dL (ref >40)
LDL Cholesterol: 47 mg/dL (ref 0–99)
Total CHOL/HDL Ratio: 3 RATIO
Triglycerides: 187 mg/dL — ABNORMAL HIGH (ref <150)
VLDL: 37 mg/dL (ref 0–40)

## 2020-05-20 LAB — CBC
HCT: 36.1 % (ref 36.0–46.0)
Hemoglobin: 11.7 g/dL — ABNORMAL LOW (ref 12.0–15.0)
MCH: 29.2 pg (ref 26.0–34.0)
MCHC: 32.4 g/dL (ref 30.0–36.0)
MCV: 90 fL (ref 80.0–100.0)
Platelets: 162 10*3/uL (ref 150–400)
RBC: 4.01 MIL/uL (ref 3.87–5.11)
RDW: 14.4 % (ref 11.5–15.5)
WBC: 11 10*3/uL — ABNORMAL HIGH (ref 4.0–10.5)
nRBC: 0 % (ref 0.0–0.2)

## 2020-05-20 NOTE — Progress Notes (Signed)
Mobility Specialist - Progress Note   05/20/20 1029  Mobility  Activity Ambulated in hall  Level of Assistance Standby assist, set-up cues, supervision of patient - no hands on  Assistive Device None  Distance Ambulated (ft) 300 ft  Mobility Response Tolerated well  Mobility performed by Mobility specialist  $Mobility charge 1 Mobility    Pre-mobility: 92 HR During mobility: 100 HR Post-mobility: 90 HR  Asx throughout ambulation. Pt sitting up in recliner after walk.   Mamie Levers Mobility Specialist Mobility Specialist Phone: [phone]

## 2020-05-20 NOTE — Plan of Care (Signed)
  Problem: Activity: Goal: Ability to tolerate increased activity will improve Outcome: Progressing   

## 2020-05-20 NOTE — Progress Notes (Signed)
PHARMACIST LIPID MONITORING   Christina Lamb is a 60 y.o. female admitted on 05/19/2020 with R foot ulcer from PAD.  Pharmacy has been consulted to optimize lipid-lowering therapy with the indication of secondary prevention for clinical ASCVD.  Recent Labs:  Lipid Panel (last 6 months):   Lab Results  Component Value Date   CHOL 125 05/20/2020   TRIG 187 (H) 05/20/2020   HDL 41 05/20/2020   CHOLHDL 3.0 05/20/2020   VLDL 37 05/20/2020   LDLCALC 47 05/20/2020    Hepatic function panel (last 6 months):   Lab Results  Component Value Date   AST 32 05/16/2020   ALT 28 05/16/2020   ALKPHOS 76 05/16/2020   BILITOT 0.8 05/16/2020    SCr (since admission):   Serum creatinine: 0.79 mg/dL 30/07/62 2633 Estimated creatinine clearance: 86.3 mL/min  Current lipid-lowering therapy: atorvastatin 80mg  Previous lipid-lowering therapies (if applicable): atorvastatin 20mg  Documented or reported allergies or intolerances to lipid-lowering therapies (if applicable): N/a  Assessment:  Patient agrees with changes to lipid-lowering therapy  Recommendation per protocol:  Continue current lipid-lowering therapy (already increased dose)  Follow-up with:  Primary care provider - , MD  Follow-up labs after discharge:    Liver function panel and lipid panel in 8-12 weeks then annually   Kirstie Peri, PharmD 05/20/2020, 11:35 AM

## 2020-05-20 NOTE — Evaluation (Signed)
Physical Therapy Evaluation Patient Details Name: Christina Lamb MRN: [id number] DOB: 11-25-1960 Today's Date: 05/20/2020   History of Present Illness  60 y.o. female, with diabetes that presents for evaluation of PAD and right foot ulcer.  She has had an ulcer on the right foot for approximately 20 weeks.  She states she has been to a number of different doctors including wound clinics and this has failed to heal with multiple treatments. She underwent a Right common femoral endarterectomy with profundoplasty and bovine pericardial patch angioplasty  2.  Right common femoral to above-knee popliteal artery bypass with 6 mm ringed PTFE.  Clinical Impression  Patient ambulated 300' with mobility tech prior to PT entering. While in her room she was a belt to transfer to and from the commode. She requires some guarding transferring from sit to stand but she is otherwise independent with mobility. She does not require further PT follow up.     Follow Up Recommendations No PT follow up    Equipment Recommendations    none     Recommendations for Other Services       Precautions / Restrictions Precautions Precautions: Fall Precaution Comments: Line removed Restrictions Weight Bearing Restrictions: No      Mobility  Bed Mobility               General bed mobility comments: Patient found standing with nurding. She reports she has been sitting at the edge of the bed on her own without assist since yesterday.    Transfers Overall transfer level: Needs assistance   Transfers: Sit to/from Stand Sit to Stand: Min guard         General transfer comment: Patient had mild difficulty standing from the commode but was abel to do one her own Was able to sit on the commode without dsifficulty/ Patient stood for several minutes while room was re-arranged  Ambulation/Gait Ambulation/Gait assistance: Independent Gait Distance (Feet): 30 Feet (Patient walked around tthe room with therapy  but had recently returned from 150' with nursing) Assistive device: None   Gait velocity: decreased   General Gait Details: slow gait pattern with wide stance but steady. No increase in pain with ambauation  Stairs            Wheelchair Mobility    Modified Rankin (Stroke Patients Only)       Balance Overall balance assessment: Needs assistance Sitting-balance support: No upper extremity supported Sitting balance-Leahy Scale: Good     Standing balance support: No upper extremity supported Standing balance-Leahy Scale: Fair                               Pertinent Vitals/Pain Pain Assessment: Faces Pain Score: 2  Pain Location: R graft site Pain Descriptors / Indicators: Tender Pain Intervention(s): Limited activity within patient's tolerance;Monitored during session;Repositioned    Home Living Family/patient expects to be discharged to:: Private residence Living Arrangements: Parent Available Help at Discharge: Family;Available 24 hours/day Type of Home: House Home Access: Stairs to enter   [employer] of Steps: 1 Home Layout: One level Home Equipment: Bedside commode;Grab bars - tub/shower;Shower seat Additional Comments: DME is patient's mother whom stays with the patient.  she is 60yo    Prior Function Level of Independence: Independent         Comments: Works full time.  No assist with any care.  Mother remains active and helps with home mangement, meals.  Hand Dominance   Dominant Hand: Right    Extremity/Trunk Assessment   Upper Extremity Assessment Upper Extremity Assessment: Defer to OT evaluation    Lower Extremity Assessment Lower Extremity Assessment: RLE deficits/detail RLE Deficits / Details: mild pain with active movement of the right leg    Cervical / Trunk Assessment Cervical / Trunk Assessment: Normal  Communication   Communication: No difficulties  Cognition Arousal/Alertness:  Awake/alert Behavior During Therapy: WFL for tasks assessed/performed Overall Cognitive Status: Within Functional Limits for tasks assessed                                        General Comments General comments (skin integrity, edema, etc.): catheter has been removed    Exercises     Assessment/Plan    PT Assessment Patent does not need any further PT services  PT Problem List         PT Treatment Interventions      PT Goals (Current goals can be found in the Care Plan section)  Acute Rehab PT Goals Patient Stated Goal: to go home PT Goal Formulation: With patient Time For Goal Achievement: 05/27/20 Potential to Achieve Goals: Good    Frequency     Barriers to discharge        Co-evaluation               AM-PAC PT "6 Clicks" Mobility  Outcome Measure Help needed turning from your back to your side while in a flat bed without using bedrails?: None Help needed moving from lying on your back to sitting on the side of a flat bed without using bedrails?: None Help needed moving to and from a bed to a chair (including a wheelchair)?: None Help needed standing up from a chair using your arms (e.g., wheelchair or bedside chair)?: None Help needed to walk in hospital room?: None Help needed climbing 3-5 steps with a railing? : A Little 6 Click Score: 23    End of Session Equipment Utilized During Treatment: Gait belt Activity Tolerance: Patient tolerated treatment well Patient left: in chair Nurse Communication: Mobility status PT Visit Diagnosis: Other abnormalities of gait and mobility (R26.89)    Time: 8469-6295 PT Time Calculation (min) (ACUTE ONLY): 15 min   Charges:   PT Evaluation $PT Eval Moderate Complexity: 1 Mod           Dessie Coma PT DPT  05/20/2020, 11:20 AM

## 2020-05-20 NOTE — Progress Notes (Addendum)
  Progress Note    05/20/2020 7:57 AM 1 Day Post-Op  Subjective: no complaints. Overall doing well. Says pain is tolerable. Has ambulated. Initially wanted to go home today with storm coming but now thinking she feels more comfortable staying to see how she does   Vitals:   05/19/20 1600 05/20/20 0340  BP: (!) 143/73 (!) 111/51  Pulse: 90 92  Resp: 16 17  Temp:  99.3 F (37.4 C)  SpO2: 98% 96%   Physical Exam: Cardiac:  regular Lungs: non labored Incisions: Right groin incision and AK pop incisions intact, clean and dry. No swelling or hematoma. Dry gauze to right groin Extremities: well perfused and warm  Abdomen: obese, soft non tender Neurologic: alert and oriented  CBC    Component Value Date/Time   WBC 11.0 (H) 05/20/2020 0340   RBC 4.01 05/20/2020 0340   HGB 11.7 (L) 05/20/2020 0340   HCT 36.1 05/20/2020 0340   PLT 162 05/20/2020 0340   MCV 90.0 05/20/2020 0340   MCH 29.2 05/20/2020 0340   MCHC 32.4 05/20/2020 0340   RDW 14.4 05/20/2020 0340    BMET    Component Value Date/Time   NA 136 05/20/2020 0340   K 4.2 05/20/2020 0340   CL 106 05/20/2020 0340   CO2 20 (L) 05/20/2020 0340   GLUCOSE 199 (H) 05/20/2020 0340   BUN 10 05/20/2020 0340   CREATININE 0.79 05/20/2020 0340   CALCIUM 8.2 (L) 05/20/2020 0340   GFRNONAA >60 05/20/2020 0340    INR    Component Value Date/Time   INR 0.9 05/16/2020 1111     Intake/Output Summary (Last 24 hours) at 05/20/2020 0757 Last data filed at 05/20/2020 0500 Gross per 24 hour  Intake 2701.69 ml  Output 3760 ml  Net -1058.31 ml     Assessment/Plan:  60 y.o. female is s/p 1.  Right common femoral endarterectomy with profundoplasty and bovine pericardial patch angioplasty 2.  Right common femoral to above-knee popliteal artery bypass with 6 mm ringed PTFE 1 Day Post-Op. Doing well post op. Incisions are intact. Dry gauze to right groin to wick moisture. Pain well controlled. Has ambulated. Tolerating diet.  Voiding without difficulty. Encourage ambulation and oob. Possible discharge tomorrow depending on inclimate weather and transportation  DVT prophylaxis:  Sq heparin   Dory Horn [facility] [phone] 05/20/2020 7:57 AM   I have independently interviewed and examined patient and agree with PA assessment and plan above.  We will plan to monitor for at least 1 more day possible discharge tomorrow versus Monday.  Maija Biggers C. Randie Heinz, MD [facility] [place] Office: [phone] Pager: [phone]

## 2020-05-21 ENCOUNTER — Encounter (HOSPITAL_COMMUNITY): Payer: Self-pay | Admitting: Vascular Surgery

## 2020-05-21 DIAGNOSIS — I70235 Atherosclerosis of native arteries of right leg with ulceration of other part of foot: Secondary | ICD-10-CM | POA: Diagnosis not present

## 2020-05-21 DIAGNOSIS — Z7984 Long term (current) use of oral hypoglycemic drugs: Secondary | ICD-10-CM | POA: Diagnosis not present

## 2020-05-21 DIAGNOSIS — Z87891 Personal history of nicotine dependence: Secondary | ICD-10-CM | POA: Diagnosis not present

## 2020-05-21 DIAGNOSIS — L97519 Non-pressure chronic ulcer of other part of right foot with unspecified severity: Secondary | ICD-10-CM | POA: Diagnosis present

## 2020-05-21 DIAGNOSIS — E11621 Type 2 diabetes mellitus with foot ulcer: Secondary | ICD-10-CM | POA: Diagnosis not present

## 2020-05-21 MED ORDER — MELATONIN 5 MG PO TABS
5.0000 mg | ORAL_TABLET | Freq: Every evening | ORAL | Status: DC | PRN
  Administered 2020-05-21: 5 mg via ORAL
  Filled 2020-05-21: qty 1

## 2020-05-21 NOTE — Progress Notes (Addendum)
  Progress Note    05/21/2020 8:05 AM 2 Days Post-Op  Subjective:  No complaints. States ambulated well yesterday   Vitals:   05/21/20 0025 05/21/20 0558  BP: (!) 94/57 106/63  Pulse:  99  Resp: 18 15  Temp: 98.3 F (36.8 C) 98.4 F (36.9 C)  SpO2: 96% 95%   Physical Exam: Cardiac: regular Lungs: non labored Incisions:  Right groin and right thigh incisions intact. No swelling or hematoma. Dry Gauze to right groin Extremities: bilateral lower extremities well perfused and warm. Doppler right AT and PT signals Abdomen:  Obese, soft non tender Neurologic: alert and oriented  CBC    Component Value Date/Time   WBC 11.0 (H) 05/20/2020 0340   RBC 4.01 05/20/2020 0340   HGB 11.7 (L) 05/20/2020 0340   HCT 36.1 05/20/2020 0340   PLT 162 05/20/2020 0340   MCV 90.0 05/20/2020 0340   MCH 29.2 05/20/2020 0340   MCHC 32.4 05/20/2020 0340   RDW 14.4 05/20/2020 0340    BMET    Component Value Date/Time   NA 136 05/20/2020 0340   K 4.2 05/20/2020 0340   CL 106 05/20/2020 0340   CO2 20 (L) 05/20/2020 0340   GLUCOSE 199 (H) 05/20/2020 0340   BUN 10 05/20/2020 0340   CREATININE 0.79 05/20/2020 0340   CALCIUM 8.2 (L) 05/20/2020 0340   GFRNONAA >60 05/20/2020 0340    INR    Component Value Date/Time   INR 0.9 05/16/2020 1111     Intake/Output Summary (Last 24 hours) at 05/21/2020 0805 Last data filed at 05/20/2020 2200 Gross per 24 hour  Intake 240 ml  Output -  Net 240 ml     Assessment/Plan:  60 y.o. female is s/p  1. Right common femoral endarterectomy with profundoplasty and bovine pericardial patch angioplasty 2. Right common femoral to above-knee popliteal artery bypass with 6 mm ringed PTFE 2 Day Post-Op. Doing well post op. Incisions are intact. Dry gauze to right groin to wick moisture. Pain well controlled. Has ambulated. Tolerating diet. Voiding without difficulty. Continue to mobilize/ Possible discharge tomorrow depending on inclimate weather and  transportation   DVT prophylaxis:  Sq heparin   Christina Lamb, [place] [facility] [phone] 05/21/2020 8:05 AM   I have independently interviewed and examined patient and agree with PA assessment and plan above.   Darrin Apodaca C. Randie Heinz, MD [facility] [place] Office: [phone] Pager: [phone]

## 2020-05-21 NOTE — Plan of Care (Signed)
  Problem: Activity: Goal: Ability to tolerate increased activity will improve Outcome: Progressing   

## 2020-05-21 NOTE — Plan of Care (Signed)
  Problem: Activity: Goal: Ability to tolerate increased activity will improve Outcome: Progressing   Problem: Skin Integrity: Goal: Demonstration of wound healing without infection will improve Outcome: Progressing

## 2020-05-21 NOTE — Progress Notes (Signed)
Mobility Specialist - Progress Note   05/21/20 1319  Mobility  Activity Ambulated in hall  Level of Assistance Standby assist, set-up cues, supervision of patient - no hands on  Assistive Device None  Distance Ambulated (ft) 300 ft  Mobility Response Tolerated well  Mobility performed by Mobility specialist  $Mobility charge 1 Mobility   Pre-mobility: 98 HR, During mobility: 124 HR Post-mobility: 117 HR  Pt asx throughout ambulation. She states this was her 3rd or 4th walk of the day and that she has been walking around the room independently.  Mamie Levers Mobility Specialist Mobility Specialist Phone: [phone]

## 2020-05-22 DIAGNOSIS — I70235 Atherosclerosis of native arteries of right leg with ulceration of other part of foot: Secondary | ICD-10-CM | POA: Diagnosis not present

## 2020-05-22 MED ORDER — OXYCODONE-ACETAMINOPHEN 5-325 MG PO TABS: 1.0000 | ORAL_TABLET | ORAL | 0 refills | Status: DC | PRN

## 2020-05-22 MED ORDER — ATORVASTATIN CALCIUM 80 MG PO TABS: 80.0000 mg | ORAL_TABLET | Freq: Every day | ORAL | 11 refills | Status: AC

## 2020-05-22 NOTE — Plan of Care (Signed)
  Problem: Acute Rehab OT Goals (only OT should resolve) Goal: Pt. Will Perform Grooming Description: Independent sit/stand at sinkside Outcome: Adequate for Discharge Goal: Pt. Will Perform Lower Body Bathing Description: Independent sit/stand at sink Outcome: Adequate for Discharge Goal: Pt. Will Perform Lower Body Dressing Description: Independent sit/stand at sink Outcome: Adequate for Discharge Goal: Pt. Will Transfer To Toilet Description: Independent ambulating to the regular toilet.   Outcome: Adequate for Discharge   Problem: Activity: Goal: Ability to tolerate increased activity will improve Outcome: Adequate for Discharge   Problem: Bowel/Gastric: Goal: Gastrointestinal status for postoperative course will improve Outcome: Adequate for Discharge   Problem: Clinical Measurements: Goal: Signs and symptoms of graft occlusion will improve Outcome: Adequate for Discharge   Problem: Skin Integrity: Goal: Demonstration of wound healing without infection will improve Outcome: Adequate for Discharge   Problem: Acute Rehab OT Goals (only OT should resolve) Goal: Pt. Will Perform Grooming Outcome: Adequate for Discharge Goal: Pt. Will Perform Lower Body Bathing Outcome: Adequate for Discharge Goal: Pt. Will Perform Lower Body Dressing Outcome: Adequate for Discharge Goal: Pt. Will Transfer To Toilet Outcome: Adequate for Discharge

## 2020-05-22 NOTE — Progress Notes (Signed)
Discharge orders received. IV and telemetry removed, CCMD notified.  Discharge instructions reviewed with patient.  Questions regarding bathing and medications addressed.  Pt expresses understanding.  Ride expected to be here around noon.

## 2020-05-22 NOTE — Progress Notes (Signed)
Occupational Therapy Treatment Patient Details Name: Christina Lamb MRN: [id number] DOB: 1961-01-09 Today's Date: 05/22/2020    History of present illness 60 y.o. female, with diabetes that presents for evaluation of PAD and right foot ulcer.  She has had an ulcer on the right foot for approximately 20 weeks.  She states she has been to a number of different doctors including wound clinics and this has failed to heal with multiple treatments. She underwent a Right common femoral endarterectomy with profundoplasty and bovine pericardial patch angioplasty  2.  Right common femoral to above-knee popliteal artery bypass with 6 mm ringed PTFE.   OT comments  Pt received returning from bathroom independently. Pt reports independence with toileting hygiene and is eager to DC home. Pt demonstrates independence with functional mobility >2ft with no overt LOB. Pt endorses some pain during LB ADLs but reports she will likely have family assist with LB tasks as well as IADLs. Education provided on LB compensatory methods available such as use of reacher. DC plan remains appropriate, will follow acutely per POC although pt likely nearing baseline level of function.   Follow Up Recommendations  No OT follow up    Equipment Recommendations  None recommended by OT    Recommendations for Other Services      Precautions / Restrictions Precautions Precautions: Fall Restrictions Weight Bearing Restrictions: No       Mobility Bed Mobility               General bed mobility comments: pt recieved OOB and returned to EOB  Transfers Overall transfer level: Needs assistance   Transfers: Sit to/from Stand Sit to Stand: Supervision         General transfer comment: pt completed sit<>stand from EOB with gross supervision with noAD    Balance Overall balance assessment: Needs assistance Sitting-balance support: No upper extremity supported Sitting balance-Leahy Scale: Good     Standing  balance support: No upper extremity supported;During functional activity Standing balance-Leahy Scale: Fair                             ADL either performed or assessed with clinical judgement   ADL Overall ADL's : Needs assistance/impaired             Lower Body Bathing: Minimal assistance Lower Body Bathing Details (indicate cue type and reason): pt reports she will likely have her mom assist her with LB bathing at incision site       Lower Body Dressing Details (indicate cue type and reason): pt reports she will ha e her mom assist with LB ADLs, brief education about using reacher for LB dressing however pt declined full demo of LB AE Toilet Transfer: Community education officer Details (indicate cue type and reason): pt found ambulating from bathroom independently Toileting- Clothing Manipulation and Hygiene: Independent Toileting - Clothing Manipulation Details (indicate cue type and reason): pt reports no issues completing pericare after toileting both anteriorly and posteriorly   Tub/Shower Transfer Details (indicate cue type and reason): pt reports tub shower and seat at home and demos ability to simulate tub shower transfer but completing one leg standing Functional mobility during ADLs: Independent General ADL Comments: pt found exiting BR with no AD, pt completed functional mobility ~ 30 ft with no AD. pt reports good support at home     Vision       Perception     Praxis  Cognition Arousal/Alertness: Awake/alert Behavior During Therapy: WFL for tasks assessed/performed Overall Cognitive Status: Within Functional Limits for tasks assessed                                 General Comments: very pleasant and appropriate asking what she can and can't do        Exercises     Shoulder Instructions       General Comments HR 104 bpm post ambulation, brief education provided on using reacher for LB ADLs however pt reports she does  have reaher at home and verbalizes understanding of LB dressing technique with reacher. pt reports pain when reaching to BLEs however pt reports she will have her mother assist her with task. pt reports assists with IADLs    Pertinent Vitals/ Pain       Pain Assessment: Faces Faces Pain Scale: Hurts a little bit Pain Location: R groin Pain Descriptors / Indicators: Sore Pain Intervention(s): Monitored during session  Home Living                                          Prior Functioning/Environment              Frequency  Min 2X/week        Progress Toward Goals  OT Goals(current goals can now be found in the care plan section)  Progress towards OT goals: Progressing toward goals  Acute Rehab OT Goals Patient Stated Goal: to go home OT Goal Formulation: With patient Time For Goal Achievement: 06/02/20 Potential to Achieve Goals: Good ADL Goals Pt Will Perform Grooming: with modified independence;standing Pt Will Perform Lower Body Bathing: with modified independence;with adaptive equipment;sit to/from stand Pt Will Perform Lower Body Dressing: with set-up;with adaptive equipment;sit to/from stand Pt Will Transfer to Toilet: with modified independence;ambulating  Plan Discharge plan remains appropriate;Frequency remains appropriate    Co-evaluation                 AM-PAC OT "6 Clicks" Daily Activity     Outcome Measure   Help from another person eating meals?: None Help from another person taking care of personal grooming?: None Help from another person toileting, which includes using toliet, bedpan, or urinal?: None Help from another person bathing (including washing, rinsing, drying)?: A Little Help from another person to put on and taking off regular upper body clothing?: None Help from another person to put on and taking off regular lower body clothing?: A Little 6 Click Score: 22    End of Session    OT Visit Diagnosis:  Pain Pain - Right/Left: Right   Activity Tolerance Patient tolerated treatment well   Patient Left in bed;with call bell/phone within reach;Other (comment) (sitting EOB)   Nurse Communication Mobility status        Time: 4270-6237 OT Time Calculation (min): 10 min  Charges: OT General Charges $OT Visit: 1 Visit OT Treatments $Self Care/Home Management : 8-22 mins  Lenor Derrick., COTA/L Acute Rehabilitation Services [phone]    Christina Lamb 05/22/2020, 9:02 AM

## 2020-05-22 NOTE — Progress Notes (Addendum)
  Progress Note    05/22/2020 7:42 AM 3 Days Post-Op  Subjective: no complaints. Ready to go home   Vitals:   05/22/20 0355 05/22/20 0733  BP: 113/64 108/66  Pulse: 83   Resp: 18 (!) 22  Temp: 98 F (36.7 C) 98.4 F (36.9 C)  SpO2: 100% 99%   Physical Exam: Cardiac: regular Lungs: non labored Incisions:  Right groin and AKA pop incisions. No swelling or hematoma. Dry gauze back to right groin Extremities: bilateral lower extremities are well perfused and warm. Doppler right AT/ PT signals. Left brisk AT/ PT Abdomen: obese, soft, non tender Neurologic: alert and oriented  CBC    Component Value Date/Time   WBC 11.0 (H) 05/20/2020 0340   RBC 4.01 05/20/2020 0340   HGB 11.7 (L) 05/20/2020 0340   HCT 36.1 05/20/2020 0340   PLT 162 05/20/2020 0340   MCV 90.0 05/20/2020 0340   MCH 29.2 05/20/2020 0340   MCHC 32.4 05/20/2020 0340   RDW 14.4 05/20/2020 0340    BMET    Component Value Date/Time   NA 136 05/20/2020 0340   K 4.2 05/20/2020 0340   CL 106 05/20/2020 0340   CO2 20 (L) 05/20/2020 0340   GLUCOSE 199 (H) 05/20/2020 0340   BUN 10 05/20/2020 0340   CREATININE 0.79 05/20/2020 0340   CALCIUM 8.2 (L) 05/20/2020 0340   GFRNONAA >60 05/20/2020 0340    INR    Component Value Date/Time   INR 0.9 05/16/2020 1111     Intake/Output Summary (Last 24 hours) at 05/22/2020 0742 Last data filed at 05/21/2020 2001 Gross per 24 hour  Intake 240 ml  Output --  Net 240 ml     Assessment/Plan:  60 y.o. female is s/p 1. Right common femoral endarterectomy with profundoplasty and bovine pericardial patch angioplasty 2. Right common femoral to above-knee popliteal artery bypass with 6 mm ringed PTFE  3 Days Post-Op. Doing well post op. Incisions are well appearing. Continue dry gauze to Right groin incision. VSS. Afebrile. Pain well controlled. Tolerating ambulation well. She is stable for discharge home today. She has follow up arranged with Dr. Chestine Spore in 4-6  weeks  DVT prophylaxis:  Sq heparin   Christina Congress, PA-C [facility] [phone] 05/22/2020 7:42 AM   I have seen and evaluated the patient. I agree with the PA note as documented above. POD#3 s/p right femoral endarterectomy and fem AK pop with PTFE.  Incisions look good.  Brisk R DP/PT signals.  OK for discharge.  Will need to keep dry dressing in groin given large pannus.  Aspirin on discharge.    Cephus Shelling, MD [facility] [place] Office: [phone]

## 2020-05-22 NOTE — Discharge Summary (Signed)
Bypass Discharge Summary Patient ID: JACKILYN UMPHLETT [id number] 60 y.o. 1960-06-13  Admit date: 05/19/2020  Discharge date and time: No discharge date for patient encounter.   Admitting Physician: Cephus Shelling, MD   Discharge Physician: Cephus Shelling, MD  Admission Diagnoses: Peripheral arterial disease [facility]) [I73.9]  Discharge Diagnoses: Peripheral artery disease  Admission Condition: fair  Discharged Condition: good  Indication for Admission: critical limb ischemia of the right lower extremity with tissue loss  Hospital Course: 60 year old female who was admitted to the hospital on 05/19/20 and underwent a right common femoral endarterectomy with profundoplasty and bovine pericardial patch angioplasty, right common femoral to above knee popliteal artery bypass with 6 mm ringed PTFE by Dr. Chestine Spore for critical limb ischemia with tissue loss. She tolerated the procedure well and was taken to the PACU in stable condition.  POD#1 did well overnight. Pain well controlled. Ambulated evening of surgery and tolerated well. Remained hemodynamically stable. Right lower extremity well perfused and warm with doppler signals right foot. Incisions to right groin and above knee popliteal intact. PT/ OT evaluated and did not recommend any follow up. Patient stable for discharge but due to in climate weather recommended she stay another day for observation and to work on continued mobility  POD#2 patient continued to do well. Tolerated increased ambulation. Pain remained well controlled. Incisions to right lower extremity intact. Voided without difficulty. Tolerating diet. Patient otherwise stable  POD#3 right lower extremity with continued adequate perfusion with doppler At/ PT signals. Incisions clean dry and intact. Tolerating mobilization. Stable for discharge home today. Instructions provided to patient on incision care. PDMP was reviewed and pain medication was sent to her requested  pharmacy. Additionally changes to her atorvastatin were made and a new prescription sent to her pharmacy. Recommend that she follow up with PCP soon for continued medication management of her Diabetes. She will follow up with Dr. Chestine Spore in the office in 4-6 weeks with right lower extremity bypass duplex and ABIs  Consults: None  Treatments: analgesia: acetaminophen and percocet and surgery:  1. Right common femoral endarterectomy with profundoplasty and bovine pericardial patch angioplasty 2. Right common femoral to above-knee popliteal artery bypass with 6 mm ringed PTFE    Disposition: Discharge disposition: 01-Home or Self Care       - For [facility] use ---  Post-op:  Wound infection: No  Graft infection: No  Transfusion: No  If yes, 0 units given New Arrhythmia: No Patency judged by: [ X] Dopper only, [ ]  Palpable graft pulse, [ ]  Palpable distal pulse, [ ]  ABI inc. > 0.15, [ ]  Duplex D/C Ambulatory Status: Ambulatory  Complications: MI: [ X] No, [ ]  Troponin only, [ ]  EKG or Clinical CHF: No Resp failure: [ X] none, [ ]  Pneumonia, [ ]  Ventilator Chg in renal function: [ X] none, [ ]  Inc. Cr > 0.5, [ ]  Temp. Dialysis, [ ]  Permanent dialysis Stroke: [ X] None, [ ]  Minor, [ ]  Major Return to OR: No  Reason for return to OR: [ ]  Bleeding, [ ]  Infection, [ ]  Thrombosis, [ ]  Revision  Discharge medications: Statin use:  Yes ASA use:  Yes Plavix use:  No  for medical reason not indicated Beta blocker use: Yes Coumadin use: No  for medical reason non compliant\    Patient Instructions:  Allergies as of 05/22/2020      Reactions   Hydromet [hydrocodone-homatropine] Nausea And Vomiting   Penicillins Swelling  Codeine Nausea And Vomiting      Medication List    STOP taking these medications   dapagliflozin propanediol 10 MG Tabs tablet Commonly known as: FARXIGA     TAKE these medications   albuterol 108 (90 Base) MCG/ACT inhaler Commonly known as: VENTOLIN  HFA Inhale 2 puffs into the lungs every 6 (six) hours as needed for wheezing or shortness of breath.   aspirin EC 81 MG tablet Take 81 mg by mouth daily.   atorvastatin 80 MG tablet Commonly known as: LIPITOR Take 1 tablet (80 mg total) by mouth daily. What changed:   medication strength  how much to take   Invokamet [phone] MG Tabs Generic drug: Canagliflozin-metFORMIN HCl Take 1 tablet by mouth 2 (two) times daily.   lisinopril 20 MG tablet Commonly known as: ZESTRIL Take 20 mg by mouth daily.   omega-3 acid ethyl esters 1 g capsule Commonly known as: LOVAZA Take 1 g by mouth 2 (two) times daily.   oxyCODONE-acetaminophen 5-325 MG tablet Commonly known as: PERCOCET/ROXICET Take 1-2 tablets by mouth every 4 (four) hours as needed for moderate pain.      Activity: activity as tolerated and no driving while on analgesics Diet: heart healthy Wound Care: keep wound clean and dry  Follow-up with Dr. Chestine Spore in 4-6  weeks.  Signed: Graceann Congress 05/22/2020 7:52 AM

## 2020-06-09 ENCOUNTER — Encounter (HOSPITAL_COMMUNITY): Payer: Self-pay

## 2020-06-13 ENCOUNTER — Ambulatory Visit (INDEPENDENT_AMBULATORY_CARE_PROVIDER_SITE_OTHER): Payer: No Typology Code available for payment source | Admitting: Vascular Surgery

## 2020-06-13 ENCOUNTER — Encounter: Payer: Self-pay | Admitting: Vascular Surgery

## 2020-06-13 ENCOUNTER — Other Ambulatory Visit: Payer: Self-pay

## 2020-06-13 VITALS — BP 106/60 | HR 79 | Temp 97.9°F | Resp 16 | Ht 64.0 in | Wt 202.0 lb

## 2020-06-13 DIAGNOSIS — I739 Peripheral vascular disease, unspecified: Secondary | ICD-10-CM

## 2020-06-13 DIAGNOSIS — I70219 Atherosclerosis of native arteries of extremities with intermittent claudication, unspecified extremity: Secondary | ICD-10-CM

## 2020-06-13 NOTE — Progress Notes (Signed)
Patient name: Christina Lamb MRN: [id number] DOB: February 22, 1961 Sex: female  REASON FOR VISIT: Postop check after right common femoral endarterectomy and right leg bypass  HPI: Christina Lamb is a 60 y.o. female who presents for postop check after right common femoral endarterectomy with right common femoral to above-knee popliteal bypass with 6 mm PTFE on May 19, 2020 for CLI with tissue loss.  She states her groin incision has been healing without issue and she is keeping a dry gauze in her groin.  She has noticed la ittle to swelling at the distal incision but has had no pain there.  She is going to the foot doctor and states that she has had a good pulse in the foot since her bypass.  She has no specific concerns today.  Past Medical History:  Diagnosis Date  . Diabetes mellitus without complication (HCC)   . Hypertension     Past Surgical History:  Procedure Laterality Date  . ABDOMINAL AORTOGRAM W/LOWER EXTREMITY Bilateral 05/11/2020   Procedure: ABDOMINAL AORTOGRAM W/LOWER EXTREMITY;  Surgeon: Cephus Shelling, MD;  Location: [facility] INVASIVE CV LAB;  Service: Cardiovascular;  Laterality: Bilateral;  . ACHILLES TENDON REPAIR    . ENDARTERECTOMY FEMORAL Right 05/19/2020   Procedure: RIGHT ENDARTERECTOMY COMMON FEMORAL WITH PROFUNDAPLASTY;  Surgeon: Cephus Shelling, MD;  Location: [facility] OR;  Service: Vascular;  Laterality: Right;  . EYE SURGERY Bilateral   . FEMORAL-POPLITEAL BYPASS GRAFT Right 05/19/2020   Procedure: RIGHT COMMON FEMORAL TO ABOVE KNEE POPLITEAL BYPASS GRAFTING WITH PROPATEN;  Surgeon: Cephus Shelling, MD;  Location: [facility] OR;  Service: Vascular;  Laterality: Right;  . PATCH ANGIOPLASTY Right 05/19/2020   Procedure: PATCH ANGIOPLASTY;  Surgeon: Cephus Shelling, MD;  Location: [facility] OR;  Service: Vascular;  Laterality: Right;    Family History  Problem Relation Age of Onset  . Heart disease Father     SOCIAL HISTORY: Social History   Tobacco Use  . Smoking  status: Former Smoker    Packs/day: 0.50    Years: 15.00    Pack years: 7.50    Types: Cigarettes    Quit date: 2018    Years since quitting: 4.1  . Smokeless tobacco: Never Used  Substance Use Topics  . Alcohol use: Never    Allergies  Allergen Reactions  . Hydromet [Hydrocodone-Homatropine] Nausea And Vomiting  . Penicillins Swelling  . Codeine Nausea And Vomiting    Current Outpatient Medications  Medication Sig Dispense Refill  . albuterol (PROVENTIL HFA;VENTOLIN HFA) 108 (90 Base) MCG/ACT inhaler Inhale 2 puffs into the lungs every 6 (six) hours as needed for wheezing or shortness of breath.    Marland Kitchen aspirin EC 81 MG tablet Take 81 mg by mouth daily.    Marland Kitchen atorvastatin (LIPITOR) 80 MG tablet Take 1 tablet (80 mg total) by mouth daily. 30 tablet 11  . INVOKAMET [phone] MG TABS Take 1 tablet by mouth 2 (two) times daily.  4  . lisinopril (PRINIVIL,ZESTRIL) 20 MG tablet Take 20 mg by mouth daily.     Marland Kitchen omega-3 acid ethyl esters (LOVAZA) 1 g capsule Take 1 g by mouth 2 (two) times daily.    Marland Kitchen oxyCODONE-acetaminophen (PERCOCET/ROXICET) 5-325 MG tablet Take 1-2 tablets by mouth every 4 (four) hours as needed for moderate pain. (Patient not taking: Reported on 06/13/2020) 30 tablet 0   No current facility-administered medications for this visit.    REVIEW OF SYSTEMS:  [X]  denotes positive finding, [ ]  denotes negative finding  Cardiac  Comments:  Chest pain or chest pressure:    Shortness of breath upon exertion:    Short of breath when lying flat:    Irregular heart rhythm:        Vascular    Pain in calf, thigh, or hip brought on by ambulation:    Pain in feet at night that wakes you up from your sleep:     Blood clot in your veins:    Leg swelling:         Pulmonary    Oxygen at home:    Productive cough:     Wheezing:         Neurologic    Sudden weakness in arms or legs:     Sudden numbness in arms or legs:     Sudden onset of difficulty speaking or slurred speech:     Temporary loss of vision in one eye:     Problems with dizziness:         Gastrointestinal    Blood in stool:     Vomited blood:         Genitourinary    Burning when urinating:     Blood in urine:        Psychiatric    Major depression:         Hematologic    Bleeding problems:    Problems with blood clotting too easily:        Skin    Rashes or ulcers:        Constitutional    Fever or chills:      PHYSICAL EXAM: Vitals:   06/13/20 0930  BP: 106/60  Pulse: 79  Resp: 16  Temp: 97.9 F (36.6 C)  TempSrc: Temporal  SpO2: 96%  Weight: 202 lb (91.6 kg)  Height: 5\' 4"  (1.626 m)    GENERAL: The patient is a well-nourished female, in no acute distress. The vital signs are documented above. CARDIAC: There is a regular rate and rhythm.  VASCULAR:  Right groin incision well-healed Likely seroma at the above-knee popliteal incision, soft Right DP palpable Dry tissue loss right toes pictured below      DATA:   None  Assessment/Plan:  60 year old female status post right common femoral endarterectomy with a right common femoral to above-knee popliteal bypass with PTFE for CLI with tissue loss on May 19, 2020.  Her right groin is healing very nicely.  I think she does have a small seroma at the above-knee popliteal incision but the skin looks good and she is having no pain here.  I would recommend just conservative management with leg elevation given any washout drainage she would be at risk of exposing the graft and getting infected.  She has an easily palpable dorsalis pedis pulse in the foot.  I'd be hopeful that she is going to completely heal her wounds given pulsatile flow at the foot.  We will give her a note to return to work on June 26, 2020 with no restrictions.  Will arrange follow-up in 6 months with ABIs and right leg arterial duplex for surveillance.   June 28, 2020, MD [facility] Office:  [phone]

## 2020-06-14 ENCOUNTER — Other Ambulatory Visit: Payer: Self-pay

## 2020-06-14 DIAGNOSIS — I70219 Atherosclerosis of native arteries of extremities with intermittent claudication, unspecified extremity: Secondary | ICD-10-CM

## 2020-07-04 ENCOUNTER — Encounter (HOSPITAL_COMMUNITY): Payer: No Typology Code available for payment source

## 2020-07-04 ENCOUNTER — Encounter: Payer: No Typology Code available for payment source | Admitting: Vascular Surgery

## 2020-07-04 ENCOUNTER — Other Ambulatory Visit (HOSPITAL_COMMUNITY): Payer: No Typology Code available for payment source

## 2020-07-28 ENCOUNTER — Inpatient Hospital Stay (HOSPITAL_COMMUNITY)
Admission: EM | Admit: 2020-07-28 | Discharge: 2020-08-01 | DRG: 240 | Disposition: A | Discharge status: Home Health | Payer: No Typology Code available for payment source | Attending: Internal Medicine | Admitting: Internal Medicine

## 2020-07-28 ENCOUNTER — Inpatient Hospital Stay (HOSPITAL_COMMUNITY): Payer: No Typology Code available for payment source

## 2020-07-28 ENCOUNTER — Telehealth: Payer: Self-pay | Admitting: Podiatry

## 2020-07-28 ENCOUNTER — Emergency Department (HOSPITAL_COMMUNITY): Payer: No Typology Code available for payment source

## 2020-07-28 ENCOUNTER — Other Ambulatory Visit: Payer: Self-pay

## 2020-07-28 ENCOUNTER — Encounter (HOSPITAL_COMMUNITY): Payer: Self-pay | Admitting: *Deleted

## 2020-07-28 DIAGNOSIS — S93126A Dislocation of metatarsophalangeal joint of unspecified lesser toe(s), initial encounter: Secondary | ICD-10-CM | POA: Diagnosis present

## 2020-07-28 DIAGNOSIS — Z885 Allergy status to narcotic agent status: Secondary | ICD-10-CM | POA: Diagnosis not present

## 2020-07-28 DIAGNOSIS — M869 Osteomyelitis, unspecified: Secondary | ICD-10-CM

## 2020-07-28 DIAGNOSIS — Z8249 Family history of ischemic heart disease and other diseases of the circulatory system: Secondary | ICD-10-CM

## 2020-07-28 DIAGNOSIS — I10 Essential (primary) hypertension: Secondary | ICD-10-CM | POA: Diagnosis present

## 2020-07-28 DIAGNOSIS — Z880 Allergy status to penicillin: Secondary | ICD-10-CM

## 2020-07-28 DIAGNOSIS — Z9889 Other specified postprocedural states: Secondary | ICD-10-CM

## 2020-07-28 DIAGNOSIS — I70219 Atherosclerosis of native arteries of extremities with intermittent claudication, unspecified extremity: Secondary | ICD-10-CM | POA: Diagnosis present

## 2020-07-28 DIAGNOSIS — E11621 Type 2 diabetes mellitus with foot ulcer: Secondary | ICD-10-CM | POA: Diagnosis present

## 2020-07-28 DIAGNOSIS — Z87891 Personal history of nicotine dependence: Secondary | ICD-10-CM

## 2020-07-28 DIAGNOSIS — Z79899 Other long term (current) drug therapy: Secondary | ICD-10-CM | POA: Diagnosis not present

## 2020-07-28 DIAGNOSIS — Z7984 Long term (current) use of oral hypoglycemic drugs: Secondary | ICD-10-CM | POA: Diagnosis not present

## 2020-07-28 DIAGNOSIS — L97519 Non-pressure chronic ulcer of other part of right foot with unspecified severity: Secondary | ICD-10-CM | POA: Diagnosis present

## 2020-07-28 DIAGNOSIS — I70235 Atherosclerosis of native arteries of right leg with ulceration of other part of foot: Secondary | ICD-10-CM | POA: Diagnosis present

## 2020-07-28 DIAGNOSIS — E119 Type 2 diabetes mellitus without complications: Secondary | ICD-10-CM

## 2020-07-28 DIAGNOSIS — Z20822 Contact with and (suspected) exposure to covid-19: Secondary | ICD-10-CM | POA: Diagnosis present

## 2020-07-28 DIAGNOSIS — L97529 Non-pressure chronic ulcer of other part of left foot with unspecified severity: Secondary | ICD-10-CM | POA: Diagnosis present

## 2020-07-28 DIAGNOSIS — L03115 Cellulitis of right lower limb: Secondary | ICD-10-CM | POA: Diagnosis present

## 2020-07-28 DIAGNOSIS — E1169 Type 2 diabetes mellitus with other specified complication: Secondary | ICD-10-CM | POA: Diagnosis present

## 2020-07-28 DIAGNOSIS — L97509 Non-pressure chronic ulcer of other part of unspecified foot with unspecified severity: Secondary | ICD-10-CM | POA: Diagnosis present

## 2020-07-28 DIAGNOSIS — I739 Peripheral vascular disease, unspecified: Secondary | ICD-10-CM | POA: Diagnosis present

## 2020-07-28 DIAGNOSIS — I872 Venous insufficiency (chronic) (peripheral): Secondary | ICD-10-CM | POA: Diagnosis present

## 2020-07-28 DIAGNOSIS — E1152 Type 2 diabetes mellitus with diabetic peripheral angiopathy with gangrene: Secondary | ICD-10-CM | POA: Diagnosis present

## 2020-07-28 DIAGNOSIS — Z7982 Long term (current) use of aspirin: Secondary | ICD-10-CM

## 2020-07-28 DIAGNOSIS — E785 Hyperlipidemia, unspecified: Secondary | ICD-10-CM | POA: Diagnosis present

## 2020-07-28 DIAGNOSIS — M86171 Other acute osteomyelitis, right ankle and foot: Secondary | ICD-10-CM

## 2020-07-28 LAB — CBC WITH DIFFERENTIAL/PLATELET
Abs Immature Granulocytes: 0.12 10*3/uL — ABNORMAL HIGH (ref 0.00–0.07)
Basophils Absolute: 0.1 10*3/uL (ref 0.0–0.1)
Basophils Relative: 0 %
Eosinophils Absolute: 0 10*3/uL (ref 0.0–0.5)
Eosinophils Relative: 0 %
HCT: 39.2 % (ref 36.0–46.0)
Hemoglobin: 12.8 g/dL (ref 12.0–15.0)
Immature Granulocytes: 1 %
Lymphocytes Relative: 6 %
Lymphs Abs: 0.9 10*3/uL (ref 0.7–4.0)
MCH: 29.4 pg (ref 26.0–34.0)
MCHC: 32.7 g/dL (ref 30.0–36.0)
MCV: 90.1 fL (ref 80.0–100.0)
Monocytes Absolute: 1.1 10*3/uL — ABNORMAL HIGH (ref 0.1–1.0)
Monocytes Relative: 7 %
Neutro Abs: 13.1 10*3/uL — ABNORMAL HIGH (ref 1.7–7.7)
Neutrophils Relative %: 86 %
Platelets: 246 10*3/uL (ref 150–400)
RBC: 4.35 MIL/uL (ref 3.87–5.11)
RDW: 14.3 % (ref 11.5–15.5)
WBC: 15.3 10*3/uL — ABNORMAL HIGH (ref 4.0–10.5)
nRBC: 0 % (ref 0.0–0.2)

## 2020-07-28 LAB — BASIC METABOLIC PANEL
Anion gap: 12 (ref 5–15)
BUN: 16 mg/dL (ref 6–20)
CO2: 23 mmol/L (ref 22–32)
Calcium: 8.4 mg/dL — ABNORMAL LOW (ref 8.9–10.3)
Chloride: 102 mmol/L (ref 98–111)
Creatinine, Ser: 0.89 mg/dL (ref 0.44–1.00)
GFR, Estimated: >60 mL/min (ref >60)
Glucose, Bld: 152 mg/dL — ABNORMAL HIGH (ref 70–99)
Potassium: 4.2 mmol/L (ref 3.5–5.1)
Sodium: 137 mmol/L (ref 135–145)

## 2020-07-28 LAB — RESP PANEL BY RT-PCR (FLU A&B, COVID) ARPGX2
Influenza A by PCR: NEGATIVE
Influenza B by PCR: NEGATIVE
SARS Coronavirus 2 by RT PCR: NEGATIVE

## 2020-07-28 LAB — HEMOGLOBIN A1C
Hgb A1c MFr Bld: 7.1 % — ABNORMAL HIGH (ref 4.8–5.6)
Mean Plasma Glucose: 157.07 mg/dL

## 2020-07-28 LAB — GLUCOSE, CAPILLARY: Glucose-Capillary: 99 mg/dL (ref 70–99)

## 2020-07-28 LAB — C-REACTIVE PROTEIN: CRP: 9.7 mg/dL — ABNORMAL HIGH (ref <1.0)

## 2020-07-28 LAB — CBG MONITORING, ED: Glucose-Capillary: 143 mg/dL — ABNORMAL HIGH (ref 70–99)

## 2020-07-28 LAB — PREALBUMIN: Prealbumin: 13.7 mg/dL — ABNORMAL LOW (ref 18–38)

## 2020-07-28 LAB — SEDIMENTATION RATE: Sed Rate: 52 mm/hr — ABNORMAL HIGH (ref 0–22)

## 2020-07-28 LAB — MRSA PCR SCREENING: MRSA by PCR: NEGATIVE

## 2020-07-28 LAB — LACTIC ACID, PLASMA: Lactic Acid, Venous: 1.3 mmol/L (ref 0.5–1.9)

## 2020-07-28 IMAGING — DX DG FOOT COMPLETE 3+V*R*
3 series · 3 of 3 positions shown · non-contrast
Comparison: MRI [DATE]

CLINICAL DATA: Right foot ulceration. History of osteomyelitis of
the fifth metatarsal head

EXAM:
RIGHT FOOT COMPLETE - 3+ VIEW

[foot ap]
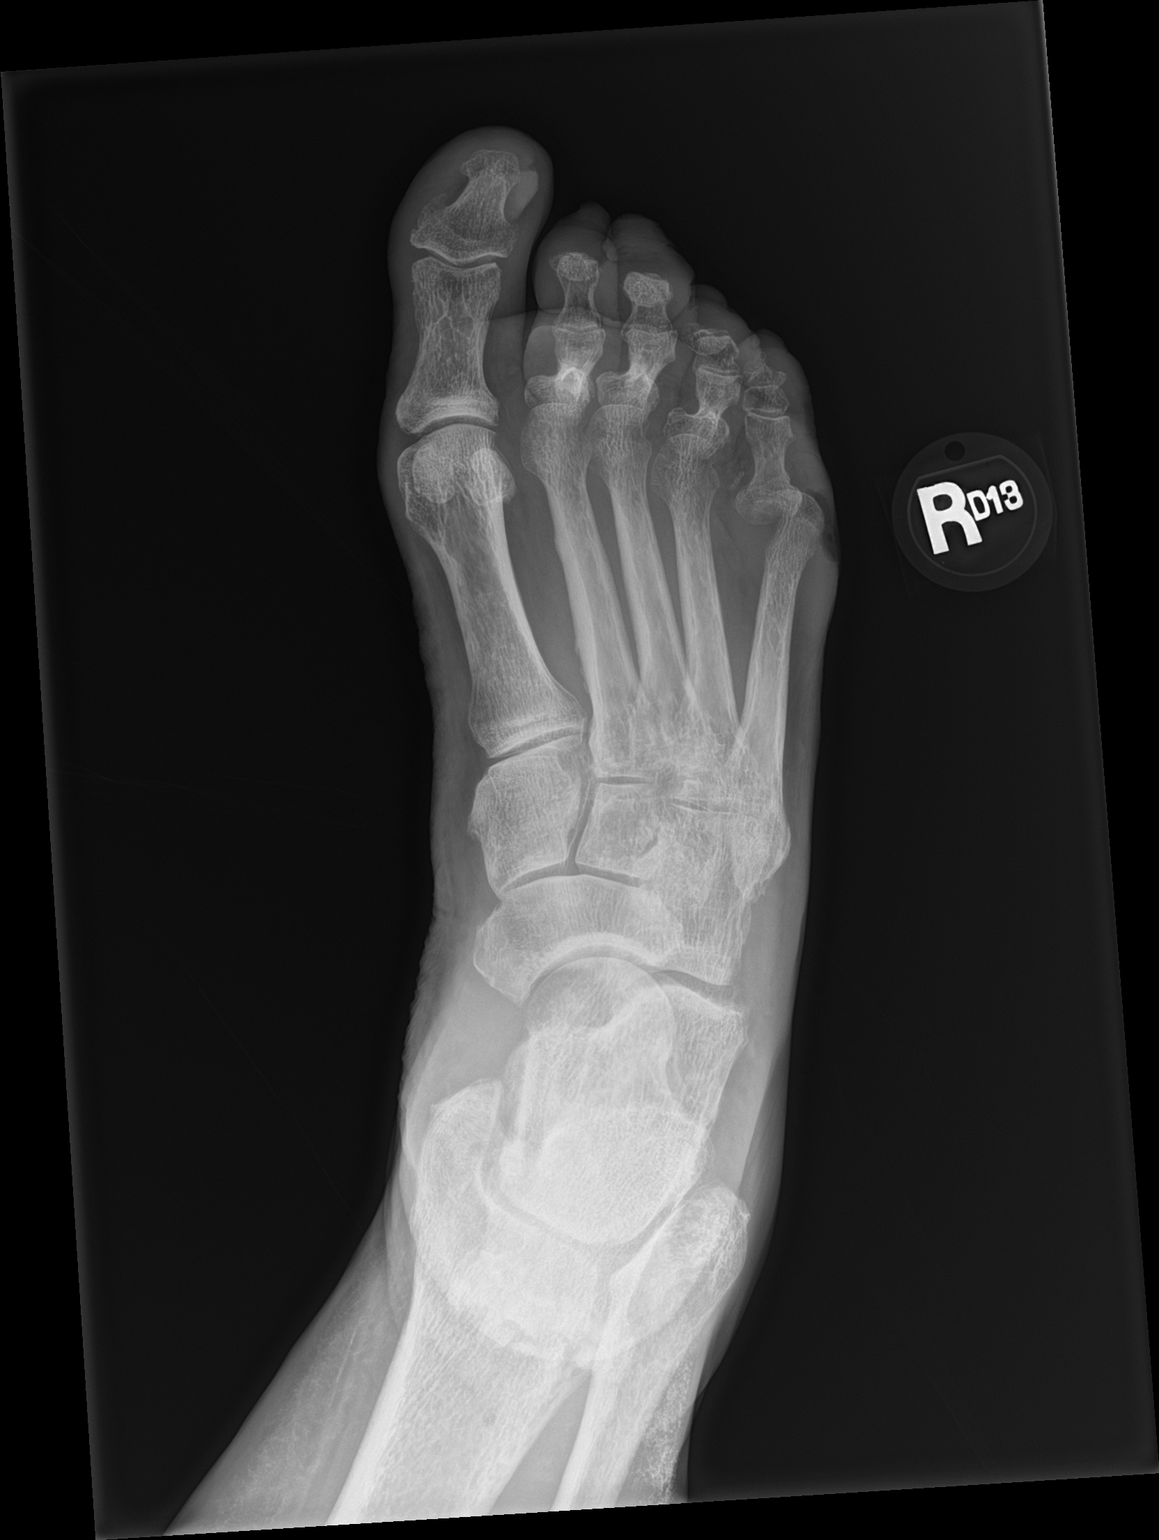

[foot obl]
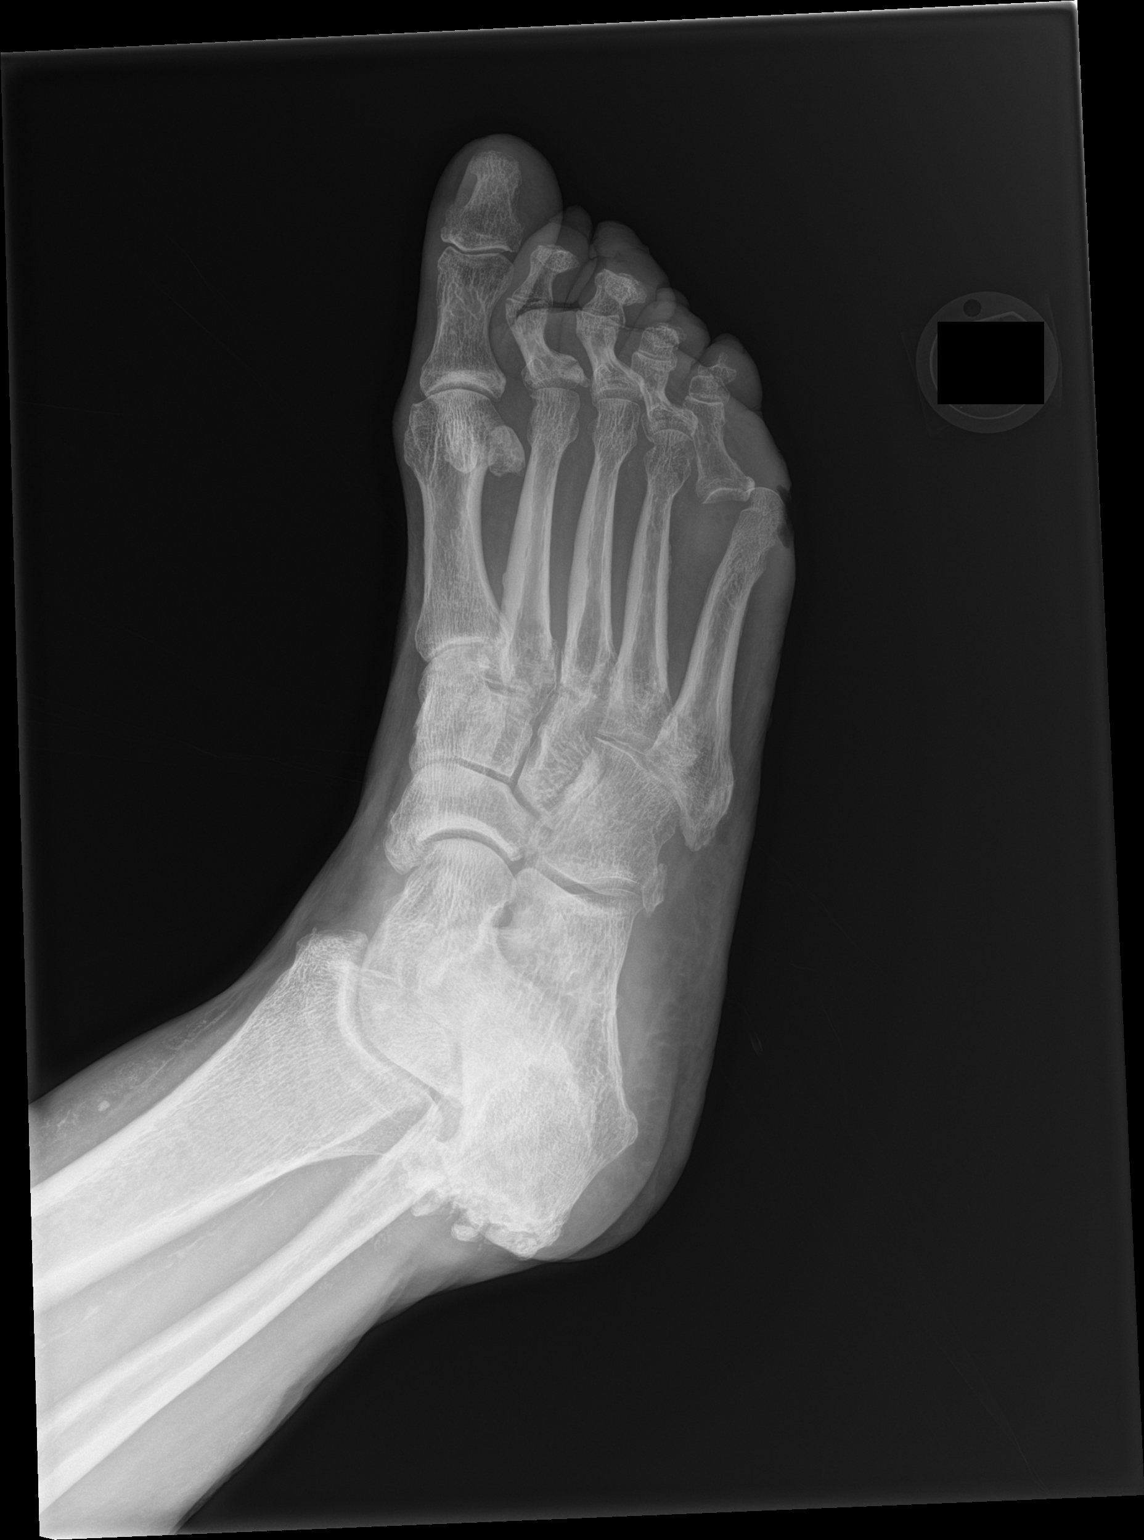

[foot lat]
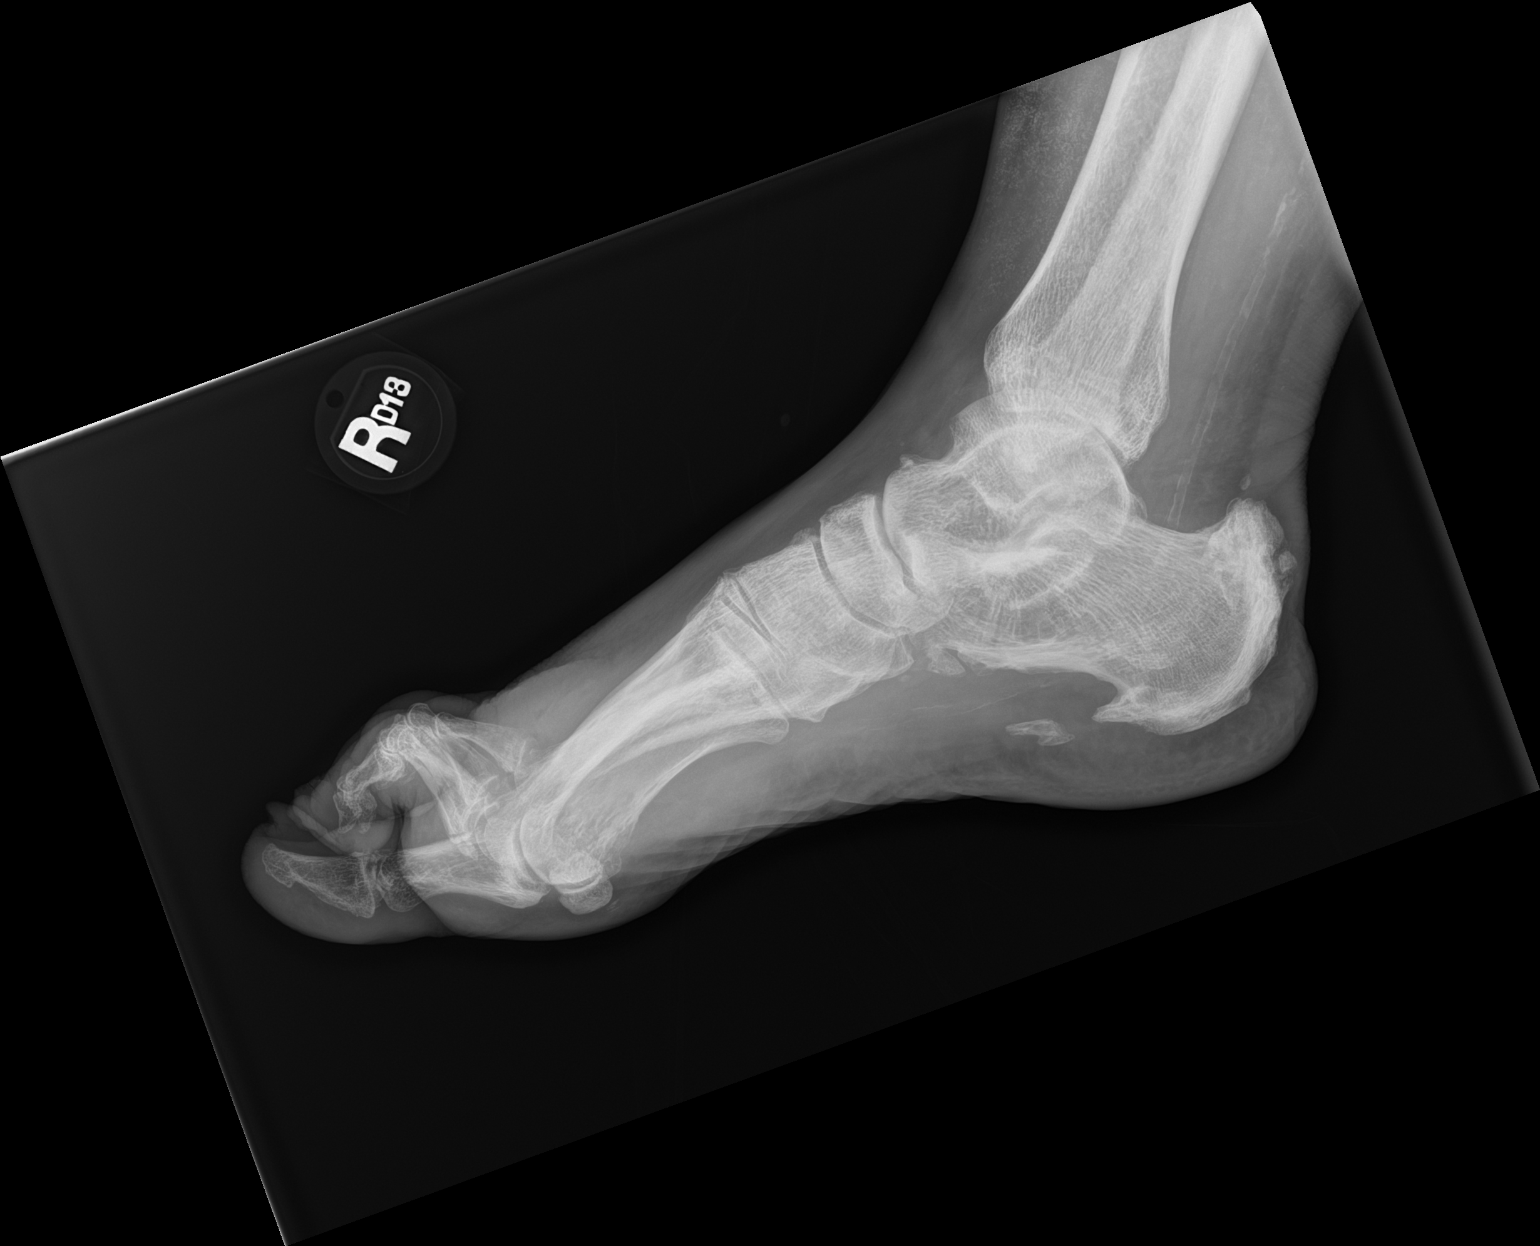

[3 of 3 positions shown; findings below may reference images not displayed]

FINDINGS: Progressive bony erosion of the fifth metatarsal head and neck
compatible with acute osteomyelitis. Suspect subtle erosion of the
base of the fifth toe proximal phalanx. There is dorsal dislocation
of the fifth MTP joint. Adjacent soft tissue ulceration with the
lateral aspect of the fifth metatarsal head closely approximating
the ulcer base, and may be exposed. Osteopenia of the fourth
metatarsal head without definite erosion. Scattered degenerative
changes. Prominent plantar calcaneal spur. Vascular calcifications.
IMPRESSION: 1. Progressive bony erosion of the fifth metatarsal head and neck
compatible with acute osteomyelitis. Suspect osteomyelitis of the
base of the fifth toe proximal phalanx.
2. Dorsal dislocation of the fifth MTP joint.
3. Adjacent soft tissue ulceration with the lateral aspect of the
fifth metatarsal head closely approximating the ulcer base, and may
be exposed.

## 2020-07-28 IMAGING — MR MR FOOT*R* WO/W CM
8 series · 40 of 40 positions shown · IV contrast (gadavist)
Comparison: X-ray [DATE], MRI [DATE]

CLINICAL DATA: Right forefoot osteomyelitis

EXAM:
MRI OF THE RIGHT FOREFOOT WITHOUT AND WITH CONTRAST
TECHNIQUE: Multiplanar, multisequence MR imaging of the right forefoot was
performed before and after the administration of intravenous
contrast.
CONTRAST:  10mL GADAVIST GADOBUTROL 1 MMOL/ML IV SOLN

[Series 3: T1 · axial · right · 3.0mm · 0.70mm/px · z∈[-123,-55]mm · 2 of 19 slices shown (1 of 2)]
[im 1/19]
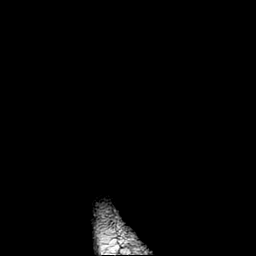
[im 19/19]
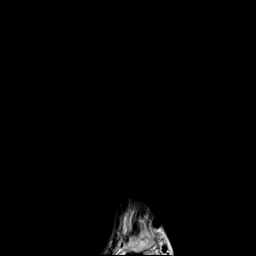

[Series 4: T2 · axial · right · 3.0mm · 0.70mm/px · z∈[-123,-55]mm · 3 of 19 slices shown (1 of 2)]
[im 1/19]
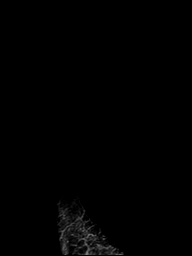
[im 10/19]
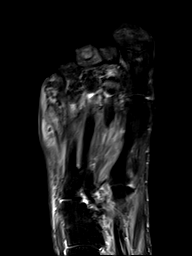
[im 19/19]
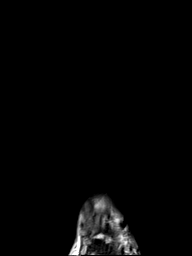

[Series 5: T1 · coronal · right · 3.0mm · 0.38mm/px · 7 of 42 slices shown (2 of 2)]
[im 1/42]
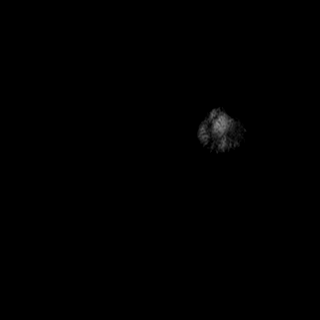
[im 7/42]
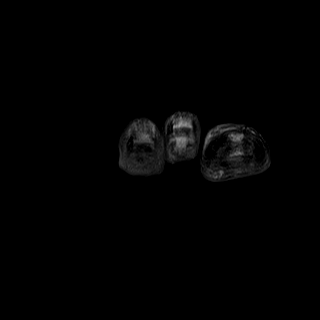
[im 14/42]
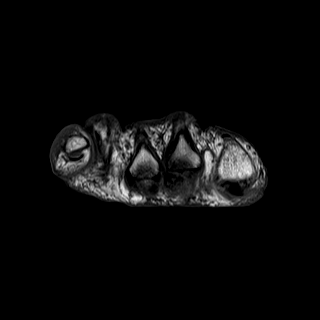
[im 21/42]
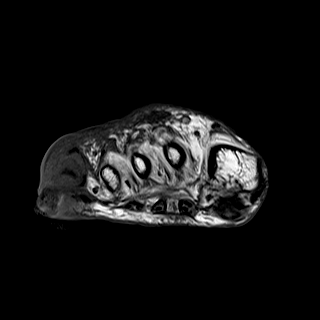
[im 28/42]
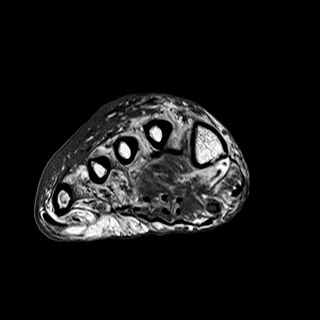
[im 35/42]
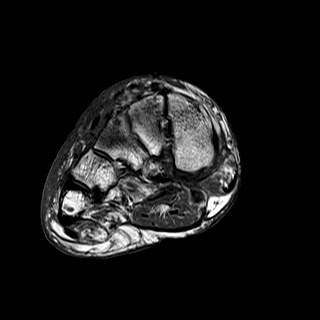
[im 42/42]
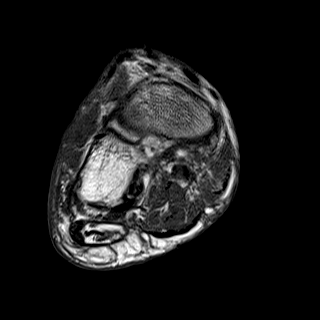

[Series 6: T2 · coronal · right · 3.0mm · 0.47mm/px · 7 of 42 slices shown (2 of 2)]
[im 1/42]
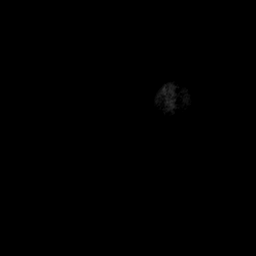
[im 7/42]
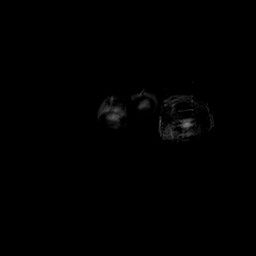
[im 14/42]
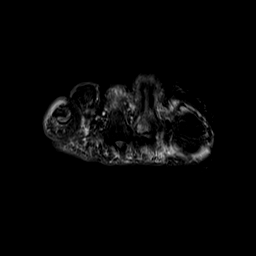
[im 21/42]
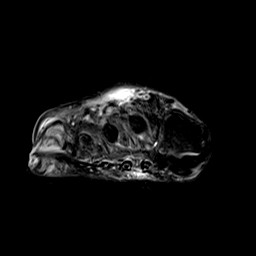
[im 28/42]
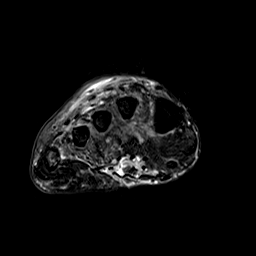
[im 35/42]
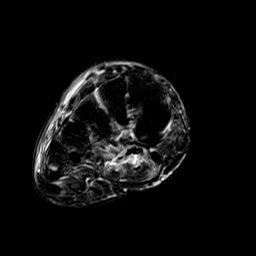
[im 42/42]
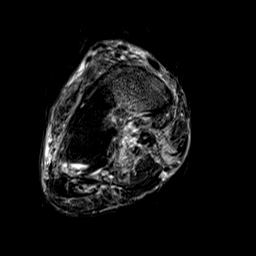

[Series 7: STIR · sagittal · right · 3.0mm · 0.35mm/px · 4 of 27 slices shown]
[im 1/27]
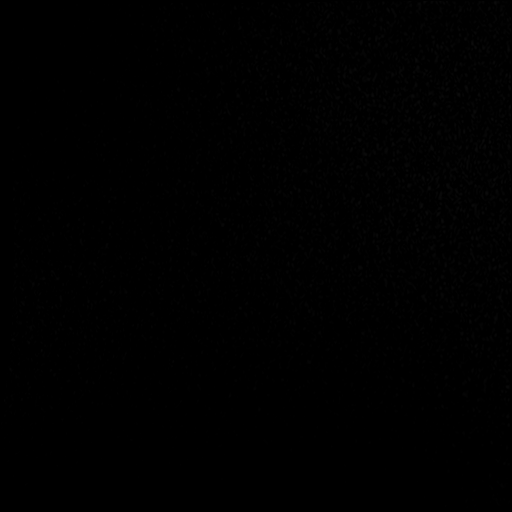
[im 9/27]
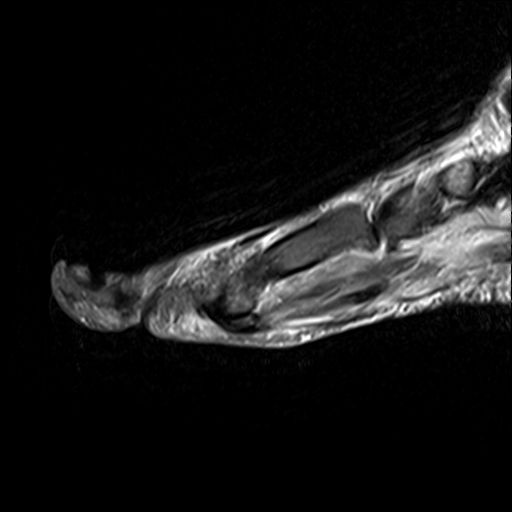
[im 18/27]
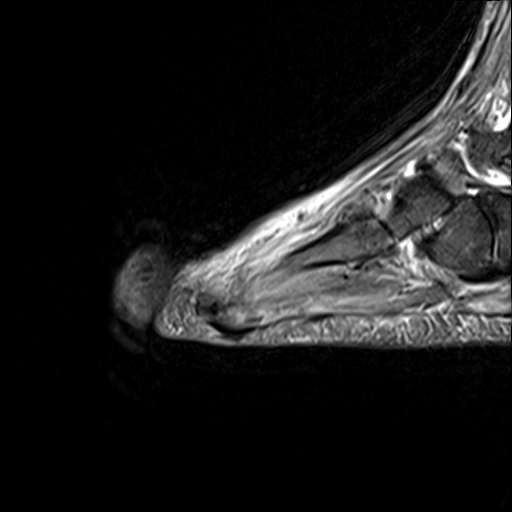
[im 27/27]
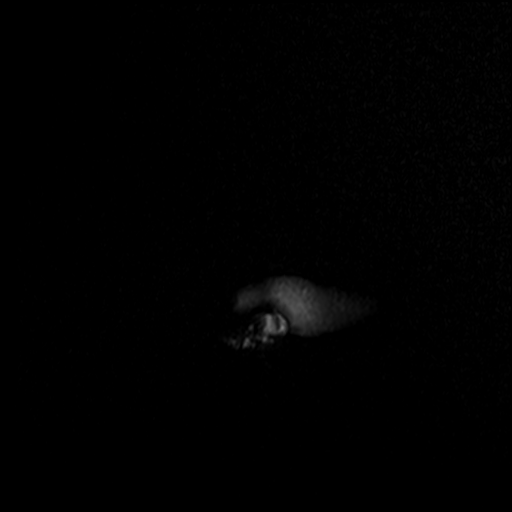

[Series 8: T1 fat-sat · coronal · non-contrast · right · 3.0mm · 0.38mm/px · 7 of 42 slices shown]
[im 1/42]
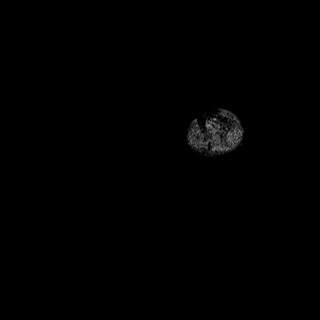
[im 7/42]
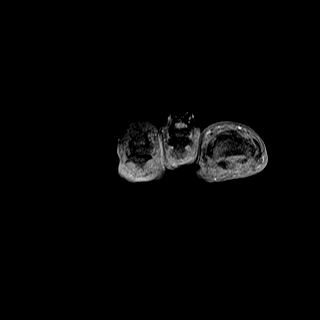
[im 14/42]
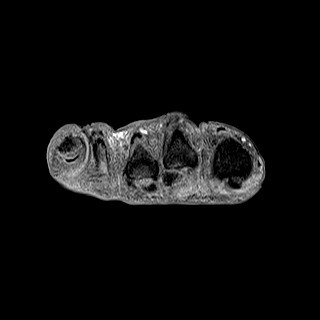
[im 21/42]
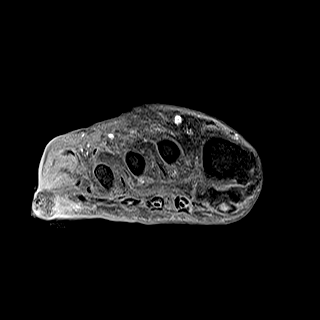
[im 28/42]
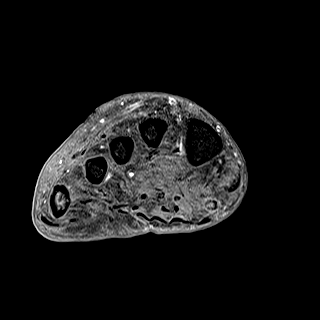
[im 35/42]
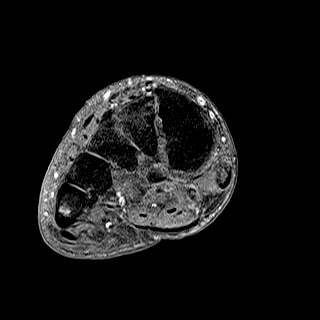
[im 42/42]
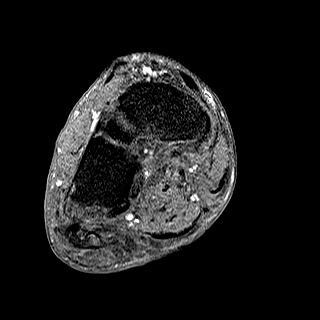

[Series 9: T1 fat-sat post-contrast · coronal · right · 3.0mm · 0.38mm/px · 7 of 42 slices shown (1 of 2)]
[im 1/42]
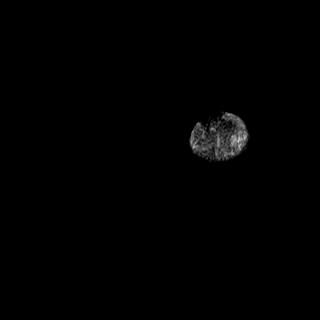
[im 7/42]
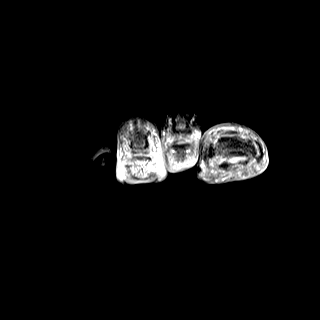
[im 14/42]
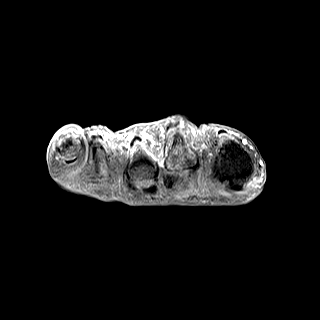
[im 21/42]
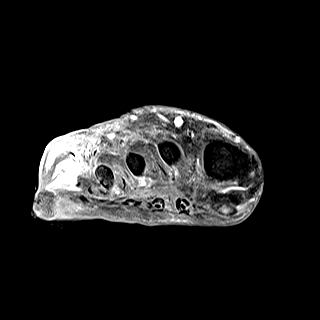
[im 28/42]
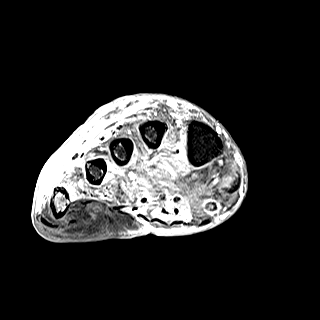
[im 35/42]
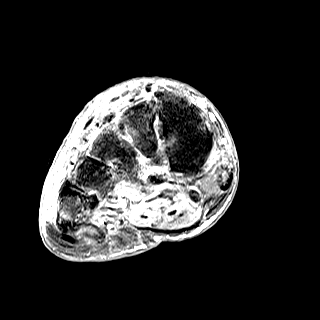
[im 42/42]
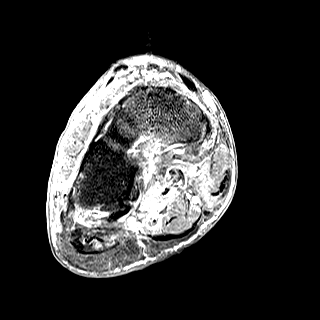

[Series 10: T1 fat-sat post-contrast · axial · right · 3.0mm · 0.70mm/px · z∈[-123,-55]mm · 3 of 19 slices shown (2 of 2)]
[im 1/19]
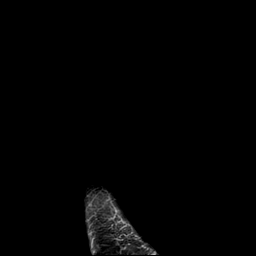
[im 10/19]
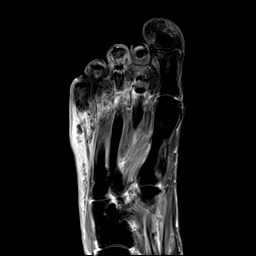
[im 19/19]
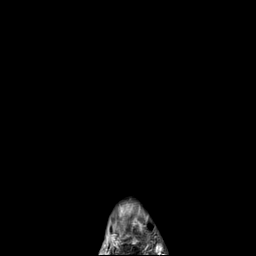

[40 of 40 positions shown; findings below may reference images not displayed]

FINDINGS: Technical note: Motion degraded exam. Patient unable to tolerate
repeat sequences. Best possible images were obtained and submitted
for interpretation.

Bones/Joint/Cartilage

Progressive destruction of the fifth metatarsal head with confluent
low T1 signal changes compatible with acute osteomyelitis. Marrow
edema and enhancement extends approximately 3.0 cm proximally to the
level of the distal diaphysis. This has progressed from the previous
MRI.

New acute osteomyelitis involving the base of the fifth toe proximal
phalanx (series 3, image 12). Fifth MTP joint remains dorsally
dislocated (series 7, image 3).

Bone marrow edema within the second metatarsal head (series 4, image
12) with enhancement and patchy low T1 marrow signal is concerning
for osteomyelitis. There is marrow edema within the adjacent second
toe proximal phalanx base without associated T1 marrow signal
abnormality, this may reflect reactive osteitis.

There is also mild marrow edema within the third and fourth
metatarsal heads with preservation of the T1 marrow signal, likely
reactive.

No acute fracture.  Flexion deformities of the lesser toes.

Ligaments

Intact Lisfranc ligament. Capsular disruption of the fifth MTP
joint. Remaining collateral ligaments intact.

Muscles and Tendons

Fatty infiltration of the intrinsic foot musculature. No
tenosynovial fluid collection.

Soft tissues

Soft tissue ulceration along the plantar-lateral aspect of the
forefoot at the level of the fifth MTP joint. Fifth metatarsal head
is likely exposed at the ulcer base. There is surrounding soft
tissue edema and enhancement with rim enhancing fluid collection
extending proximally to the level of the fifth metatarsal neck
measuring approximately 1.8 x 0.5 x 1.5 cm (series 9, image 23).
Diffuse dorsal soft tissue edema.
IMPRESSION: 1. Soft tissue ulceration along the plantar-lateral aspect of the
forefoot at the level of the fifth MTP joint. Fifth metatarsal head
is likely exposed at the ulcer base. Small abscess extending
proximally to the level of the fifth metatarsal neck.
2. Progressive acute osteomyelitis of the fifth metatarsal head and
neck as well as the base of the fifth toe proximal phalanx.
3. Marrow edema within the second metatarsal head with patchy low T1
marrow signal, concerning for osteomyelitis. Mild marrow edema
within the adjacent second toe proximal phalanx base without
associated T1 marrow signal abnormality, this may reflect reactive
osteitis.
4. Mild marrow edema within the third and fourth metatarsal heads,
likely reactive.

## 2020-07-28 MED ORDER — ONDANSETRON HCL 4 MG PO TABS: 4.0000 mg | ORAL_TABLET | Freq: Four times a day (QID) | ORAL | Status: DC | PRN

## 2020-07-28 MED ORDER — INSULIN ASPART 100 UNIT/ML ~~LOC~~ SOLN: 0.0000 [IU] | Freq: Every day | SUBCUTANEOUS | Status: DC

## 2020-07-28 MED ORDER — ATORVASTATIN CALCIUM 40 MG PO TABS
80.0000 mg | ORAL_TABLET | Freq: Every day | ORAL | Status: DC
  Administered 2020-07-29 – 2020-08-01 (×3): 80 mg via ORAL
  Filled 2020-07-28 (×4): qty 2

## 2020-07-28 MED ORDER — SODIUM CHLORIDE 0.9 % IV SOLN
2.0000 g | INTRAVENOUS | Status: DC
  Administered 2020-07-28 – 2020-07-31 (×4): 2 g via INTRAVENOUS
  Filled 2020-07-28 (×5): qty 20

## 2020-07-28 MED ORDER — MORPHINE SULFATE (PF) 4 MG/ML IV SOLN
4.0000 mg | Freq: Once | INTRAVENOUS | Status: AC
Start: 2020-07-28 — End: 2020-07-28
  Administered 2020-07-28: 4 mg via INTRAVENOUS
  Filled 2020-07-28: qty 1

## 2020-07-28 MED ORDER — GADOBUTROL 1 MMOL/ML IV SOLN
10.0000 mL | Freq: Once | INTRAVENOUS | Status: AC | PRN
  Administered 2020-07-28: 10 mL via INTRAVENOUS

## 2020-07-28 MED ORDER — ONDANSETRON HCL 4 MG/2ML IJ SOLN: 4.0000 mg | Freq: Four times a day (QID) | INTRAMUSCULAR | Status: DC | PRN

## 2020-07-28 MED ORDER — ENOXAPARIN SODIUM 40 MG/0.4ML ~~LOC~~ SOLN
40.0000 mg | SUBCUTANEOUS | Status: DC
  Administered 2020-07-28 – 2020-07-29 (×2): 40 mg via SUBCUTANEOUS
  Filled 2020-07-28 (×2): qty 0.4

## 2020-07-28 MED ORDER — INSULIN ASPART 100 UNIT/ML ~~LOC~~ SOLN
0.0000 [IU] | Freq: Three times a day (TID) | SUBCUTANEOUS | Status: DC
  Administered 2020-07-29: 2 [IU] via SUBCUTANEOUS
  Administered 2020-07-30: 8 [IU] via SUBCUTANEOUS
  Administered 2020-07-30: 2 [IU] via SUBCUTANEOUS
  Administered 2020-07-31: 3 [IU] via SUBCUTANEOUS
  Administered 2020-08-01: 2 [IU] via SUBCUTANEOUS
  Administered 2020-08-01: 3 [IU] via SUBCUTANEOUS

## 2020-07-28 MED ORDER — CITALOPRAM HYDROBROMIDE 20 MG PO TABS
40.0000 mg | ORAL_TABLET | Freq: Every day | ORAL | Status: DC
  Administered 2020-07-29 – 2020-08-01 (×3): 40 mg via ORAL
  Filled 2020-07-28 (×4): qty 2

## 2020-07-28 MED ORDER — ASPIRIN EC 81 MG PO TBEC
81.0000 mg | DELAYED_RELEASE_TABLET | Freq: Every day | ORAL | Status: DC
  Administered 2020-07-29 – 2020-08-01 (×3): 81 mg via ORAL
  Filled 2020-07-28 (×4): qty 1

## 2020-07-28 MED ORDER — METRONIDAZOLE IN NACL 5-0.79 MG/ML-% IV SOLN
500.0000 mg | Freq: Three times a day (TID) | INTRAVENOUS | Status: DC
  Administered 2020-07-28 – 2020-08-01 (×12): 500 mg via INTRAVENOUS
  Filled 2020-07-28 (×13): qty 100

## 2020-07-28 MED ORDER — INSULIN DETEMIR 100 UNIT/ML ~~LOC~~ SOLN
30.0000 [IU] | Freq: Every day | SUBCUTANEOUS | Status: DC
  Administered 2020-07-28 – 2020-07-30 (×3): 30 [IU] via SUBCUTANEOUS
  Filled 2020-07-28 (×4): qty 0.3

## 2020-07-28 MED ORDER — LISINOPRIL 10 MG PO TABS
20.0000 mg | ORAL_TABLET | Freq: Every day | ORAL | Status: DC
Start: 2020-07-29 — End: 2020-07-29
  Administered 2020-07-29: 20 mg via ORAL
  Filled 2020-07-28: qty 2

## 2020-07-28 NOTE — Telephone Encounter (Signed)
Dr. Marylene Land this patient has not been seen here. Please Advise

## 2020-07-28 NOTE — Telephone Encounter (Signed)
I spoke the the PA. Patient was confused and thought they had been seen here before and is looking into things about her care and will call me back on cellphone if she needs me

## 2020-07-28 NOTE — Telephone Encounter (Signed)
Sharen Heck form [facility] called stated that they wanted Dr. Allena Katz to call due to patients foot pain-bil foot ulcers.

## 2020-07-28 NOTE — H&P (Addendum)
History and Physical  [name] UKG:[id number] DOB: 1960-11-06 DOA: 07/28/2020  Referring physician: Sharen Heck, PA-C, EDP PCP: Kirstie Peri, MD  Outpatient Specialists:   Patient Coming From: home  Chief Complaint: foot pain  HPI: Christina Lamb is a 60 y.o. female with a history of diabetes type 2 on oral hypoglycemics, hypertension, hyperlipidemia, peripheral artery disease with a recent femoropopliteal bypass with Dr. Chestine Spore on 05/2020, with chronic foot ulcers followed by Dr. Allena Katz.  She has had this wound for 3 to 4 months and goes to the wound care clinic.  3 to 4 days ago, the patient was walking and felt that pop on her right foot with onset of pain..  She states that a blister formed on the distal lateral part of her right foot.  A couple days later, her mother was changing her dressing and the blister popped with drainage of blood and possible purulent fluid.  Pain improved at this point.  Mom noted that it appeared that there was bone visible in the wound.  They called podiatry office, who instructed her to come here for evaluation.  No palliating or provoking factors.  Denies fevers, chills, nausea, vomiting.  Emergency Department Course: X-ray shows bony erosion of the fifth metatarsal head and neck compatible with acute osteomyelitis.  There is dislocation of the fifth MTP joint.  White count 15.  Review of Systems:   Pt denies any fevers, chills, nausea, vomiting, diarrhea, constipation, abdominal pain, shortness of breath, dyspnea on exertion, orthopnea, cough, wheezing, palpitations, headache, vision changes, lightheadedness, dizziness, melena, rectal bleeding.  Review of systems are otherwise negative  Past Medical History:  Diagnosis Date  . Diabetes mellitus without complication (HCC)   . Hypertension    Past Surgical History:  Procedure Laterality Date  . ABDOMINAL AORTOGRAM W/LOWER EXTREMITY Bilateral 05/11/2020   Procedure: ABDOMINAL AORTOGRAM W/LOWER  EXTREMITY;  Surgeon: Cephus Shelling, MD;  Location: [facility] INVASIVE CV LAB;  Service: Cardiovascular;  Laterality: Bilateral;  . ACHILLES TENDON REPAIR    . ENDARTERECTOMY FEMORAL Right 05/19/2020   Procedure: RIGHT ENDARTERECTOMY COMMON FEMORAL WITH PROFUNDAPLASTY;  Surgeon: Cephus Shelling, MD;  Location: [facility] OR;  Service: Vascular;  Laterality: Right;  . EYE SURGERY Bilateral   . FEMORAL-POPLITEAL BYPASS GRAFT Right 05/19/2020   Procedure: RIGHT COMMON FEMORAL TO ABOVE KNEE POPLITEAL BYPASS GRAFTING WITH PROPATEN;  Surgeon: Cephus Shelling, MD;  Location: [facility] OR;  Service: Vascular;  Laterality: Right;  . PATCH ANGIOPLASTY Right 05/19/2020   Procedure: PATCH ANGIOPLASTY;  Surgeon: Cephus Shelling, MD;  Location: [facility] OR;  Service: Vascular;  Laterality: Right;   Social History:  reports that she quit smoking about 4 years ago. Her smoking use included cigarettes. She has a 7.50 pack-year smoking history. She has never used smokeless tobacco. She reports that she does not drink alcohol and does not use drugs. Patient lives at home  Allergies  Allergen Reactions  . Hydromet [Hydrocodone-Homatropine] Nausea And Vomiting  . Penicillins Swelling  . Codeine Nausea And Vomiting    Family History  Problem Relation Age of Onset  . Heart disease Father       Prior to Admission medications   Medication Sig Start Date End Date Taking? Authorizing Provider  atorvastatin (LIPITOR) 80 MG tablet Take 1 tablet (80 mg total) by mouth daily. 05/22/20  Yes Baglia, Corrina, PA-C  citalopram (CELEXA) 40 MG tablet Take 40 mg by mouth daily.   Yes [provider]  dapagliflozin propanediol (FARXIGA) 10  MG TABS tablet Take 10 mg by mouth daily.   Yes [provider]  glimepiride (AMARYL) 4 MG tablet Take 4 mg by mouth daily with breakfast.   Yes [provider]  lisinopril (PRINIVIL,ZESTRIL) 20 MG tablet Take 20 mg by mouth in the morning and at bedtime.   Yes [provider]  albuterol (PROVENTIL HFA;VENTOLIN HFA) 108 (90 Base) MCG/ACT inhaler Inhale 2 puffs into the lungs every 6 (six) hours as needed for wheezing or shortness of breath.    [provider]  aspirin EC 81 MG tablet Take 81 mg by mouth daily.    [provider]  INVOKAMET [phone] MG TABS Take 1 tablet by mouth 2 (two) times daily. 09/10/17   [provider]  omega-3 acid ethyl esters (LOVAZA) 1 g capsule Take 1 g by mouth 2 (two) times daily.    [provider]  oxyCODONE-acetaminophen (PERCOCET/ROXICET) 5-325 MG tablet Take 1-2 tablets by mouth every 4 (four) hours as needed for moderate pain. Patient not taking: Reported on 06/13/2020 05/22/20   Graceann CongressBaglia, Corrina, PA-C    Physical Exam: BP 112/63   Pulse 77   Temp 98 F (36.7 C) (Oral)   Resp 20   Ht 5\' 6"  (1.676 m)   Wt 90.7 kg   SpO2 100%   BMI 32.28 kg/m   . General: Middle-age female. Awake and alert and oriented x3. No acute cardiopulmonary distress.  Marland Kitchen. HEENT: Normocephalic atraumatic.  Right and left ears normal in appearance.  Pupils equal, round, reactive to light. Extraocular muscles are intact. Sclerae anicteric and noninjected.  Moist mucosal membranes. No mucosal lesions.  . Neck: Neck supple without lymphadenopathy. No carotid bruits. No masses palpated.  . Cardiovascular: Regular rate with normal S1-S2 sounds. No murmurs, rubs, gallops auscultated. No JVD.  Marland Kitchen. Respiratory: Good respiratory effort with no wheezes, rales, rhonchi. Lungs clear to auscultation bilaterally.  No accessory muscle use. . Abdomen: Soft, nontender, nondistended. Active bowel sounds. No masses or hepatosplenomegaly  . Skin: Open ulceration over the fifth MTP joints of the right foot.  The distal metatarsal at that site is visible in the ulceration.  There is a second ulceration on the plantar surface of the left foot that measures about 1 cm in diameter.  Dry, warm to touch. 2+ dorsalis pedis and radial  pulses. . Musculoskeletal: No calf or leg pain. All major joints not erythematous nontender.  No upper or lower joint deformation.  Good ROM.  No contractures  . Psychiatric: Intact judgment and insight. Pleasant and cooperative. . Neurologic: No focal neurological deficits. Strength is 5/5 and symmetric in upper and lower extremities.  Cranial nerves II through XII are grossly intact.           Labs on Admission: I have personally reviewed following labs and imaging studies  CBC: Recent Labs  Lab 07/28/20 1300  WBC 15.3*  NEUTROABS 13.1*  HGB 12.8  HCT 39.2  MCV 90.1  PLT 246   Basic Metabolic Panel: Recent Labs  Lab 07/28/20 1300  NA 137  K 4.2  CL 102  CO2 23  GLUCOSE 152*  BUN 16  CREATININE 0.89  CALCIUM 8.4*   GFR: Estimated Creatinine Clearance: 77.3 mL/min (by C-G formula based on SCr of 0.89 mg/dL). Liver Function Tests: No results for input(s): AST, ALT, ALKPHOS, BILITOT, PROT, ALBUMIN in the last 168 hours. No results for input(s): LIPASE, AMYLASE in the last 168 hours. No results for input(s): AMMONIA in the  last 168 hours. Coagulation Profile: No results for input(s): INR, PROTIME in the last 168 hours. Cardiac Enzymes: No results for input(s): CKTOTAL, CKMB, CKMBINDEX, TROPONINI in the last 168 hours. BNP (last 3 results) No results for input(s): PROBNP in the last 8760 hours. HbA1C: No results for input(s): HGBA1C in the last 72 hours. CBG: No results for input(s): GLUCAP in the last 168 hours. Lipid Profile: No results for input(s): CHOL, HDL, LDLCALC, TRIG, CHOLHDL, LDLDIRECT in the last 72 hours. Thyroid Function Tests: No results for input(s): TSH, T4TOTAL, FREET4, T3FREE, THYROIDAB in the last 72 hours. Anemia Panel: No results for input(s): VITAMINB12, FOLATE, FERRITIN, TIBC, IRON, RETICCTPCT in the last 72 hours. Urine analysis:    Component Value Date/Time   COLORURINE YELLOW 05/16/2020 1111   APPEARANCEUR HAZY (A) 05/16/2020 1111    LABSPEC 1.010 05/16/2020 1111   PHURINE 5.0 05/16/2020 1111   GLUCOSEU >=500 (A) 05/16/2020 1111   HGBUR NEGATIVE 05/16/2020 1111   BILIRUBINUR NEGATIVE 05/16/2020 1111   KETONESUR NEGATIVE 05/16/2020 1111   PROTEINUR NEGATIVE 05/16/2020 1111   NITRITE NEGATIVE 05/16/2020 1111   LEUKOCYTESUR NEGATIVE 05/16/2020 1111   Sepsis Labs: @LABRCNTIP (procalcitonin:4,lacticidven:4) ) Recent Results (from the past 240 hour(s))  Blood culture (routine x 2)     Status: None (Preliminary result)   Collection Time: 07/28/20  1:00 PM   Specimen: BLOOD  Result Value Ref Range Status   Specimen Description BLOOD BLOOD LEFT ARM  Final   Special Requests   Final    Blood Culture adequate volume BOTTLES DRAWN AEROBIC AND ANAEROBIC Performed at [facility], [address] [place], [place]    Culture PENDING  Incomplete   Report Status PENDING  Incomplete  Blood culture (routine x 2)     Status: None (Preliminary result)   Collection Time: 07/28/20  1:00 PM   Specimen: BLOOD  Result Value Ref Range Status   Specimen Description BLOOD LEFT ANTECUBITAL  Final   Special Requests   Final    Blood Culture adequate volume BOTTLES DRAWN AEROBIC AND ANAEROBIC Performed at [facility], [address], [place], [place]    Culture PENDING  Incomplete   Report Status PENDING  Incomplete     Radiological Exams on Admission: DG Foot Complete Right  Result Date: 07/28/2020 CLINICAL DATA:  Right foot ulceration. History of osteomyelitis of the fifth metatarsal head EXAM: RIGHT FOOT COMPLETE - 3+ VIEW COMPARISON:  MRI 05/08/2020 FINDINGS: Progressive bony erosion of the fifth metatarsal head and neck compatible with acute osteomyelitis. Suspect subtle erosion of the base of the fifth toe proximal phalanx. There is dorsal dislocation of the fifth MTP joint. Adjacent soft tissue ulceration with the lateral aspect of the fifth metatarsal head closely approximating the ulcer base, and may be  exposed. Osteopenia of the fourth metatarsal head without definite erosion. Scattered degenerative changes. Prominent plantar calcaneal spur. Vascular calcifications. IMPRESSION: 1. Progressive bony erosion of the fifth metatarsal head and neck compatible with acute osteomyelitis. Suspect osteomyelitis of the base of the fifth toe proximal phalanx. 2. Dorsal dislocation of the fifth MTP joint. 3. Adjacent soft tissue ulceration with the lateral aspect of the fifth metatarsal head closely approximating the ulcer base, and may be exposed. Electronically Signed   By: 07/06/2020 D.O.   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  We also discussed care given by the ED provider.  1. Osteomyelitis of the fifth metatarsal of the right foot a. Admit b. We will check ABI c. Podiatry to see the patient. d. Start Rocephin and flagyl.  e. Check MRSA by PCR - add vanc if positive f. Blood cultures pending g. Hemoglobin A1c h. CBC in the morning 2. Diabetes a. Hemoglobin A1c b. Levemir in lieu of oral hypoglycemics c. CBGs before meals and nightly with sliding scale insulin. 3. Diabetic foot ulcer a. /Wound care consult 4. Hypertension a. Continue antihypertensives 5. Peripheral artery disease 6. Chronic venous insufficiency  DVT prophylaxis: lovenox Consultants: Podiatry Code Status: Full code Family Communication: None Disposition Plan: Pending   Levie Heritage, DO

## 2020-07-28 NOTE — ED Triage Notes (Signed)
Bilateral foot ulcers, states the right foot has a bone protruding and she was sent here for evaluation

## 2020-07-28 NOTE — ED Notes (Signed)
Patient transported to MRI 

## 2020-07-28 NOTE — Consult Note (Signed)
HPI: patient seen at bedside in the ER today. Patient has been seen previously by me for the right foot ulcer. She was currently being treated at [facility] wound care center. I was notified this morning that they are sending patient to hospital because her ulcer has gotten worse on the right foot. She denies any nausea, vomiting, fever or chills. She states the right foot at the ulcer site is throbbing. She is going to work. She does not walk. She mainly sits at work.  Past Medical History:  Diagnosis Date  . Diabetes mellitus without complication ([place])   . Hypertension         Patient Active Problem List   Diagnosis Date Noted  . Peripheral arterial disease ([place]) 05/19/2020  . PAD (peripheral artery disease) ([place]) 04/25/2020  . 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 Chronic venous insufficiency 10/08/2017  . Varicose veins of bilateral lower extremities with other complications 53/29/9242         Past Surgical History:  Procedure Laterality Date  . ABDOMINAL AORTOGRAM W/LOWER EXTREMITY Bilateral 05/11/2020   Procedure: ABDOMINAL AORTOGRAM W/LOWER EXTREMITY;  Surgeon: Marty Heck, MD;  Location: [place] [facility];  Service: Cardiovascular;  Laterality: Bilateral;  . ACHILLES TENDON REPAIR    . ENDARTERECTOMY FEMORAL Right 05/19/2020   Procedure: RIGHT ENDARTERECTOMY COMMON FEMORAL WITH PROFUNDAPLASTY;  Surgeon: Marty Heck, MD;  Location: [place];  Service: Vascular;  Laterality: Right;  . EYE SURGERY Bilateral   . FEMORAL-POPLITEAL BYPASS GRAFT Right 05/19/2020   Procedure: RIGHT COMMON FEMORAL TO ABOVE KNEE POPLITEAL BYPASS GRAFTING WITH PROPATEN;  Surgeon: Marty Heck, MD;  Location: [place];  Service: Vascular;  Laterality: Right;  . PATCH ANGIOPLASTY Right 05/19/2020   Procedure: PATCH ANGIOPLASTY;  Surgeon: Marty Heck, MD;  Location: [facility] OR;  Service: Vascular;  Laterality: Right;                 OB History   No obstetric history on file.          Family History  Problem Relation Age of Onset  . Heart disease Father     Social History        Tobacco Use  . Smoking status: Former Smoker    Packs/day: 0.50    Years: 15.00    Pack years: 7.50    Types: Cigarettes    Quit date: 2018    Years since quitting: 4.2  . Smokeless tobacco: Never Used  Substance Use Topics  . Alcohol use: Never  . Drug use: Never    Home Medications Prior to Admission medications   Medication Sig Start Date End Date Taking? Authorizing Provider  albuterol (PROVENTIL HFA;VENTOLIN HFA) 108 (90 Base) MCG/ACT inhaler Inhale 2 puffs into the lungs every 6 (six) hours as needed for wheezing or shortness of breath.    [provider]  aspirin EC 81 MG tablet Take 81 mg by mouth daily.    [provider]  atorvastatin (LIPITOR) 80 MG tablet Take 1 tablet (80 mg total) by mouth daily. 05/22/20   Baglia, Corrina, PA-C  INVOKAMET [id number] MG TABS Take 1 tablet by mouth 2 (two) times daily. 09/10/17   [provider]  lisinopril (PRINIVIL,ZESTRIL) 20 MG tablet Take 20 mg by mouth daily.     [provider]  omega-3 acid ethyl esters (LOVAZA) 1 g capsule Take 1 g by mouth 2 (two) times daily.    [provider]  oxyCODONE-acetaminophen (PERCOCET/ROXICET) 5-325 MG tablet Take 1-2 tablets by mouth every 4 (four) hours as needed for moderate pain. Patient not taking: Reported on 06/13/2020 05/22/20   Karoline Caldwell, PA-C    Allergies            Hydromet [hydrocodone-homatropine], Penicillins, and Codeine  Review of Systems   Review of Systems  Skin: Positive for color change and wound.  All other systems reviewed and are negative.   Physical Exam Updated Vital Signs BP 112/63   Pulse 77   Temp 98 F (36.7 C) (Oral)   Resp 20   Ht '5\' 6"'  (1.676 m)   Wt 90.7 kg   SpO2 100%   BMI 32.28 kg/m   Derm: Right  foot: There is full thickness ulcer noted on the right sub 5th met head measuring 1.3x0.8x0.5. There is exposed bone noted at the ulcer site with erosive changes noted. There is serosanguinous drainage noted. Increase in warmth noted at the ulcer site with localized redness. No soft tissue crepitus felt. There is also ulcer noted on the left foot sub 2nd met head with granular wound base measuring 0.9x0.5x0.3. No drainage noted. Does not probe to bone. Contracted lesser digits noted on both feet.  Vascular: pedal pulses palpable. Capillary refill is brisk to lesser toes.  Musculoskeletal: Muscle strength is 5/5.  Neuro: Protective sensation is absent at the metatarsal level.    ED Results / Procedures / Treatments   Labs (all labs ordered are listed, but only abnormal results are displayed)      Labs Reviewed  CBC WITH DIFFERENTIAL/PLATELET - Abnormal; Notable for the following components:      Result Value    WBC 15.3 (*)    Neutro Abs 13.1 (*)    Monocytes Absolute 1.1 (*)    Abs Immature Granulocytes 0.12 (*)    All other components within normal limits  BASIC METABOLIC PANEL - Abnormal; Notable for the following components:   Glucose, Bld 152 (*)    Calcium 8.4 (*)    All other components within normal limits  CULTURE, BLOOD (ROUTINE X 2)  CULTURE, BLOOD (ROUTINE X 2)  AEROBIC CULTURE W GRAM STAIN (SUPERFICIAL SPECIMEN)  LACTIC ACID, PLASMA   Radiograph:   EXAM: RIGHT FOOT COMPLETE - 3+ VIEW  COMPARISON:  MRI 05/08/2020  FINDINGS: Progressive bony erosion of the fifth metatarsal head and neck compatible with acute osteomyelitis. Suspect subtle erosion of the base of the fifth toe proximal phalanx. There is dorsal dislocation of the fifth MTP joint. Adjacent soft tissue ulceration with the lateral aspect of the fifth metatarsal head closely approximating the ulcer base, and may be exposed. Osteopenia of the fourth metatarsal head without definite erosion.  Scattered degenerative changes. Prominent plantar calcaneal spur. Vascular calcifications.  IMPRESSION: 1. Progressive bony erosion of the fifth metatarsal head and neck compatible with acute osteomyelitis. Suspect osteomyelitis of the base of the fifth toe proximal phalanx. 2. Dorsal dislocation of the fifth MTP joint. 3. Adjacent soft tissue ulceration with the lateral aspect of the fifth metatarsal head closely approximating the ulcer base, and may be exposed.   Assessment/Plan:   Acute osteomyelitis of the right fifth toe. Chronic ulceration right fifth toe.  Cellulitis of the right foot.  Chronic ulceration of the left foot.  Diabetes with neuropathy.   Plan:  Patient examined and evaluated. Dry sterile dressing applied. Given that she has failed multiple wound care treatments and exposed bone, I suggested her partial 5th ray amputation of  the right foot vs metatarsal head resection. She is agreeing to both.  I discussed that I can do the procedure on Monday 07/31/20. I will speak with OR staff and put her on as add on.  I discussed IV antibiotics possibly PICC line after the surgery.  Discussed possibility of wound Vac if I do not close her amputation site.   For now she will need IV antibiotics.  I will follow up with patient while she is in hospital.

## 2020-07-28 NOTE — ED Provider Notes (Addendum)
[facility] EMERGENCY DEPARTMENT Provider Note   CSN: [id number] Arrival date & time: 07/28/20  1124     History Chief Complaint  Patient presents with  . Foot Pain    Christina Lamb is a 60 y.o. female with history of PAD, recent right common femoral endarterectomy/angioplasty and femoral to above-the-knee popliteal artery bypass by Dr. Chestine Spore January 2022, diabetes on oral agents presents to the ED for evaluation of right foot wound.  She has had this wound for almost 3 to 4 months.  She goes to the wound clinic every Friday.  Went to see them earlier today and was advised to come to the ER by Dr. Allena Katz for evaluation.  Reports last Tuesday/Wednesday she developed pain around the wound.  When her mother was changing the dressings some pus and blood came out of the wound and the pain has improved since then.  She has noticed what she thinks is a piece of bone coming out of the wound.  Reports the pain is no longer there.  She is on doxycycline.  Denies fevers or chills or trauma.  She is wearing some version of a cam walker.  Has been ambulating and putting some pressure on it and short distances.  Has a smaller left foot wound is healing appropriately.  Reports recently her diabetes has improved.  Her last A1c is 6.4.  Back fasting glucose 75 and 160s during the day.  No foot ankle or leg swelling, calf pain.   HPI     Past Medical History:  Diagnosis Date  . Diabetes mellitus without complication (HCC)   . Hypertension     Patient Active Problem List   Diagnosis Date Noted  . Diabetic foot ulcer (HCC) 07/28/2020  . Acute osteomyelitis of metatarsal bone of right foot (HCC) 07/28/2020  . Diabetes mellitus without complication (HCC)   . Hypertension   . Peripheral arterial disease (HCC) 05/19/2020  . PAD (peripheral artery disease) (HCC) 04/25/2020  . 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 Chronic venous insufficiency 10/08/2017   . Varicose veins of bilateral lower extremities with other complications 10/08/2017    Past Surgical History:  Procedure Laterality Date  . ABDOMINAL AORTOGRAM W/LOWER EXTREMITY Bilateral 05/11/2020   Procedure: ABDOMINAL AORTOGRAM W/LOWER EXTREMITY;  Surgeon: Cephus Shelling, MD;  Location: [facility] INVASIVE CV LAB;  Service: Cardiovascular;  Laterality: Bilateral;  . ACHILLES TENDON REPAIR    . ENDARTERECTOMY FEMORAL Right 05/19/2020   Procedure: RIGHT ENDARTERECTOMY COMMON FEMORAL WITH PROFUNDAPLASTY;  Surgeon: Cephus Shelling, MD;  Location: [facility] OR;  Service: Vascular;  Laterality: Right;  . EYE SURGERY Bilateral   . FEMORAL-POPLITEAL BYPASS GRAFT Right 05/19/2020   Procedure: RIGHT COMMON FEMORAL TO ABOVE KNEE POPLITEAL BYPASS GRAFTING WITH PROPATEN;  Surgeon: Cephus Shelling, MD;  Location: [facility] OR;  Service: Vascular;  Laterality: Right;  . PATCH ANGIOPLASTY Right 05/19/2020   Procedure: PATCH ANGIOPLASTY;  Surgeon: Cephus Shelling, MD;  Location: [facility] OR;  Service: Vascular;  Laterality: Right;     OB History   No obstetric history on file.     Family History  Problem Relation Age of Onset  . Heart disease Father     Social History   Tobacco Use  . Smoking status: Former Smoker    Packs/day: 0.50    Years: 15.00    Pack years: 7.50    Types: Cigarettes    Quit date: 2018    Years since quitting: 4.2  .  Smokeless tobacco: Never Used  Substance Use Topics  . Alcohol use: Never  . Drug use: Never    Home Medications Prior to Admission medications   Medication Sig Start Date End Date Taking? Authorizing Provider  albuterol (PROVENTIL HFA;VENTOLIN HFA) 108 (90 Base) MCG/ACT inhaler Inhale 2 puffs into the lungs every 6 (six) hours as needed for wheezing or shortness of breath.   Yes [provider]  aspirin EC 81 MG tablet Take 81 mg by mouth daily.   Yes [provider]  atorvastatin (LIPITOR) 80 MG tablet Take 1 tablet (80 mg total) by  mouth daily. 05/22/20  Yes Baglia, Corrina, PA-C  citalopram (CELEXA) 40 MG tablet Take 40 mg by mouth daily.   Yes [provider]  dapagliflozin propanediol (FARXIGA) 10 MG TABS tablet Take 10 mg by mouth daily.   Yes [provider]  doxycycline (VIBRA-TABS) 100 MG tablet Take 100 mg by mouth 2 (two) times daily. 07/21/20  Yes [provider]  glimepiride (AMARYL) 4 MG tablet Take 4 mg by mouth daily with breakfast.   Yes [provider]  INVOKAMET [id number] MG TABS Take 1 tablet by mouth 2 (two) times daily. 09/10/17  Yes [provider]  lisinopril (PRINIVIL,ZESTRIL) 20 MG tablet Take 20 mg by mouth in the morning and at bedtime.   Yes [provider]  omega-3 acid ethyl esters (LOVAZA) 1 g capsule Take 1 g by mouth 2 (two) times daily.   Yes [provider]    Allergies    Hydromet [hydrocodone-homatropine], Penicillins, and Codeine  Review of Systems   Review of Systems  Skin: Positive for color change and wound.  All other systems reviewed and are negative.   Physical Exam Updated Vital Signs BP 112/63   Pulse 77   Temp 98 F (36.7 C) (Oral)   Resp 20   Ht 5\' 6"  (1.676 m)   Wt 90.7 kg   SpO2 100%   BMI 32.28 kg/m   Physical Exam Constitutional:      Appearance: She is well-developed.  HENT:     Head: Normocephalic.     Nose: Nose normal.  Eyes:     General: Lids are normal.  Cardiovascular:     Rate and Rhythm: Normal rate.  Pulmonary:     Effort: Pulmonary effort is normal. No respiratory distress.  Musculoskeletal:        General: Normal range of motion.     Cervical back: Normal range of motion.  Skin:    Comments:  1 x 2 cm lateral submetatarsal open wound with mild surrounding erythema.  Bone visualized and palpable from the wound.  Emphysema/crepitus on the dorsal/lateral aspect of the foot.  Mild surrounding erythema around the wound.  Thickened and discolored toenails.  No wounds noted in  between the toes.  Locally tender.  Serosanguineous/bloody drainage coming out of the wound, scant.  1 x 0.5 cm round wound left 2nd submetatarsal, appears to be healing.  No local tenderness, erythema or drainage.  No wounds noted in between the toes.  Neurological:     Mental Status: She is alert.  Psychiatric:        Behavior: Behavior normal.           ED Results / Procedures / Treatments   Labs (all labs ordered are listed, but only abnormal results are displayed) Labs Reviewed  CBC WITH DIFFERENTIAL/PLATELET - Abnormal; Notable for the following components:      Result Value  WBC 15.3 (*)    Neutro Abs 13.1 (*)    Monocytes Absolute 1.1 (*)    Abs Immature Granulocytes 0.12 (*)    All other components within normal limits  BASIC METABOLIC PANEL - Abnormal; Notable for the following components:   Glucose, Bld 152 (*)    Calcium 8.4 (*)    All other components within normal limits  CULTURE, BLOOD (ROUTINE X 2)  CULTURE, BLOOD (ROUTINE X 2)  AEROBIC CULTURE W GRAM STAIN (SUPERFICIAL SPECIMEN)  RESP PANEL BY RT-PCR (FLU A&B, COVID) ARPGX2  MRSA PCR SCREENING  LACTIC ACID, PLASMA    EKG None  Radiology DG Foot Complete Right  Result Date: 07/28/2020 CLINICAL DATA:  Right foot ulceration. History of osteomyelitis of the fifth metatarsal head EXAM: RIGHT FOOT COMPLETE - 3+ VIEW COMPARISON:  MRI 05/08/2020 FINDINGS: Progressive bony erosion of the fifth metatarsal head and neck compatible with acute osteomyelitis. Suspect subtle erosion of the base of the fifth toe proximal phalanx. There is dorsal dislocation of the fifth MTP joint. Adjacent soft tissue ulceration with the lateral aspect of the fifth metatarsal head closely approximating the ulcer base, and may be exposed. Osteopenia of the fourth metatarsal head without definite erosion. Scattered degenerative changes. Prominent plantar calcaneal spur. Vascular calcifications. IMPRESSION: 1. Progressive bony erosion of  the fifth metatarsal head and neck compatible with acute osteomyelitis. Suspect osteomyelitis of the base of the fifth toe proximal phalanx. 2. Dorsal dislocation of the fifth MTP joint. 3. Adjacent soft tissue ulceration with the lateral aspect of the fifth metatarsal head closely approximating the ulcer base, and may be exposed. Electronically Signed   By: Duanne Guess D.O.   On: 07/28/2020 13:48    Procedures Procedures   Medications Ordered in ED Medications  enoxaparin (LOVENOX) injection 40 mg (has no administration in time range)  cefTRIAXone (ROCEPHIN) 2 g in sodium chloride 0.9 % 100 mL IVPB (has no administration in time range)  metroNIDAZOLE (FLAGYL) IVPB 500 mg (has no administration in time range)  morphine 4 MG/ML injection 4 mg (4 mg Intravenous Given 07/28/20 1620)    ED Course  I have reviewed the triage vital signs and the nursing notes.  Pertinent labs & imaging results that were available during my care of the patient were reviewed by me and considered in my medical decision making (see chart for details).  Clinical Course as of 07/28/20 1751  Fri Jul 28, 2020  1353 DG Foot Complete Right IMPRESSION: 1. Progressive bony erosion of the fifth metatarsal head and neck compatible with acute osteomyelitis. Suspect osteomyelitis of the base of the fifth toe proximal phalanx. 2. Dorsal dislocation of the fifth MTP joint. 3. Adjacent soft tissue ulceration with the lateral aspect of the fifth metatarsal head closely approximating the ulcer base, and may be exposed. [CG]  1411 Asked secretary to page consult  [CG]  1412 WBC(!): 15.3 [CG]  1412 Lactic Acid, Venous: 1.3 [CG]  1412 Glucose(!): 152 [CG]    Clinical Course User Index [CG] Liberty Handy, PA-C   MDM Rules/Calculators/A&P                          60 year old female presents to the ED for right foot wound with drainage, increased pain and bone exposure.  No fevers or chills.  Has been on doxycycline.   Seen by Dr. Allena Katz at [facility] foot and ankle.  EMR, triage nursing notes reviewed to obtain more history and  assist with MDM.  Additional information obtained from patient's mother at bedside.  Labs, imaging ordered by me as above.  These were personally visualized and interpreted  Imaging reveals- acute osteomyelitis of the 5th toe proximal phalanx and fifth metatarsal head and neck, dorsal dislocation of the fifth MTP joint.   Labs reveals - WBC 15.3, lactic acid normal. No fevers.   Will consult podiatry, anticipate admission/OR.   Medicines ordered in ER - morphine, antibiotics considered will await podiatry recommendations   1630: Spoke to podiatry, Dr Allena KatzPatel will see patient in ER.   1750: Dr Allena KatzPatel has evaluated patient in the ER. Recommends rocephin and MRI of foot. Anticipates OR on Monday. Requesting admission for IV antibiotics. Keep NPO Sunday night. Updated Dr Adrian BlackwaterStinson of plan of care and Dr Allena KatzPatel personal phone number. MRI ordered here.   Consults in the ER - podiatry, hospitalist Dr Adrian BlackwaterStinson will admit.   Final Clinical Impression(s) / ED Diagnoses Final diagnoses:  Osteomyelitis of metatarsal [facility](HCC)    Rx / DC Orders ED Discharge Orders    None         Liberty HandyGibbons, Claudia J, PA-C 07/28/20 1751    Vanetta MuldersZackowski, Scott, MD 07/29/20 [phone]

## 2020-07-29 DIAGNOSIS — M86171 Other acute osteomyelitis, right ankle and foot: Secondary | ICD-10-CM

## 2020-07-29 LAB — COMPREHENSIVE METABOLIC PANEL
ALT: 11 U/L (ref 0–44)
AST: 12 U/L — ABNORMAL LOW (ref 15–41)
Albumin: 2.8 g/dL — ABNORMAL LOW (ref 3.5–5.0)
Alkaline Phosphatase: 75 U/L (ref 38–126)
Anion gap: 11 (ref 5–15)
BUN: 15 mg/dL (ref 6–20)
CO2: 22 mmol/L (ref 22–32)
Calcium: 8.2 mg/dL — ABNORMAL LOW (ref 8.9–10.3)
Chloride: 106 mmol/L (ref 98–111)
Creatinine, Ser: 0.72 mg/dL (ref 0.44–1.00)
GFR, Estimated: >60 mL/min (ref >60)
Glucose, Bld: 76 mg/dL (ref 70–99)
Potassium: 3.9 mmol/L (ref 3.5–5.1)
Sodium: 139 mmol/L (ref 135–145)
Total Bilirubin: 0.3 mg/dL (ref 0.3–1.2)
Total Protein: 6 g/dL — ABNORMAL LOW (ref 6.5–8.1)

## 2020-07-29 LAB — GLUCOSE, CAPILLARY
Glucose-Capillary: 84 mg/dL (ref 70–99)
Glucose-Capillary: 124 mg/dL — ABNORMAL HIGH (ref 70–99)
Glucose-Capillary: 116 mg/dL — ABNORMAL HIGH (ref 70–99)
Glucose-Capillary: 125 mg/dL — ABNORMAL HIGH (ref 70–99)

## 2020-07-29 MED ORDER — ADULT MULTIVITAMIN W/MINERALS CH
1.0000 | ORAL_TABLET | Freq: Every day | ORAL | Status: DC
  Administered 2020-07-29 – 2020-08-01 (×3): 1 via ORAL
  Filled 2020-07-29 (×4): qty 1

## 2020-07-29 MED ORDER — KETOROLAC TROMETHAMINE 15 MG/ML IJ SOLN: 15.0000 mg | Freq: Four times a day (QID) | INTRAMUSCULAR | Status: DC | PRN

## 2020-07-29 MED ORDER — SIMETHICONE 80 MG PO CHEW
80.0000 mg | CHEWABLE_TABLET | Freq: Four times a day (QID) | ORAL | Status: DC | PRN
  Administered 2020-07-29: 80 mg via ORAL
  Filled 2020-07-29: qty 1

## 2020-07-29 MED ORDER — ACETAMINOPHEN 325 MG PO TABS
650.0000 mg | ORAL_TABLET | Freq: Four times a day (QID) | ORAL | Status: DC | PRN
  Administered 2020-07-29 (×2): 650 mg via ORAL
  Filled 2020-07-29 (×2): qty 2

## 2020-07-29 MED ORDER — KETOROLAC TROMETHAMINE 30 MG/ML IJ SOLN
30.0000 mg | Freq: Four times a day (QID) | INTRAMUSCULAR | Status: DC | PRN
  Administered 2020-07-29 – 2020-08-01 (×5): 30 mg via INTRAVENOUS
  Filled 2020-07-29 (×5): qty 1

## 2020-07-29 MED ORDER — ENSURE MAX PROTEIN PO LIQD
11.0000 [oz_av] | Freq: Two times a day (BID) | ORAL | Status: DC
  Administered 2020-07-29 – 2020-08-01 (×3): 11 [oz_av] via ORAL

## 2020-07-29 MED ORDER — MELATONIN 3 MG PO TABS
6.0000 mg | ORAL_TABLET | Freq: Every day | ORAL | Status: DC
  Administered 2020-07-29 – 2020-07-31 (×3): 6 mg via ORAL
  Filled 2020-07-29 (×3): qty 2

## 2020-07-29 NOTE — Progress Notes (Signed)
PROGRESS NOTE    Christina Lamb  ENI:[id number] DOB: June 04, 1960 DOA: 07/28/2020 PCP: Kirstie Peri, MD   Brief Narrative:  Christina Lamb is a 60 y.o. female with a history of diabetes type 2 on oral hypoglycemics, hypertension, hyperlipidemia, peripheral artery disease with a recent femoropopliteal bypass with Dr. Chestine Spore on 05/2020, with chronic foot ulcers followed by Dr. Allena Katz.  She has had this wound for 3 to 4 months and goes to the wound care clinic.  3 to 4 days ago, the patient was walking and felt that pop on her right foot with onset of pain.  Patient was admitted with osteomyelitis of the fifth metatarsal of the right foot in the setting of diabetes with neuropathy.  Plan is for operative intervention on 3/28 with antibiotics that have been ordered for ongoing treatment until then.  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 (HCC)   Chronic venous insufficiency   PAD (peripheral artery disease) (HCC)   Diabetic foot ulcer (HCC)   Diabetes mellitus without complication (HCC)   Hypertension   Acute osteomyelitis of metatarsal bone of right foot (HCC)   Acute osteomyelitis of the right fifth toe -With associated cellulitis of right foot in the setting of chronic ulceration -Continue on Rocephin and Flagyl as prescribed -Blood cultures with no noted growth x1 day -Podiatry planning for amputation on 07/31/2020 -May potentially require PICC line placement and long-term IV antibiotics after surgery -MRSA PCR negative  Type 2 diabetes with neuropathy -Hemoglobin A1c 7.1% -Is well controlled on long-acting insulin and SSI  Hypertension -Currently with soft blood pressure readings -Plan to hold lisinopril for now and monitor closely  Peripheral arterial disease/chronic venous insufficiency -Plan to check ABI  DVT prophylaxis: Lovenox Code Status: Full Family Communication: None at bedside, patient will  call Disposition Plan:  Status is: Inpatient  Remains inpatient appropriate because:IV treatments appropriate due to intensity of illness or inability to take PO and Inpatient level of care appropriate due to severity of illness   Dispo: The patient is from: Home              Anticipated d/c is to: Home              Patient currently is not medically stable to d/c.   Difficult to place patient No   Consultants:   Podiatry  Procedures:   See below  Antimicrobials:  Anti-infectives (From admission, onward)   Start     Dose/Rate Route Frequency Ordered Stop   07/28/20 1815  cefTRIAXone (ROCEPHIN) 2 g in sodium chloride 0.9 % 100 mL IVPB        2 g 200 mL/hr over 30 Minutes Intravenous Every 24 hours 07/28/20 1743     07/28/20 1800  metroNIDAZOLE (FLAGYL) IVPB 500 mg        500 mg 100 mL/hr over 60 Minutes Intravenous Every 8 hours 07/28/20 1743         Subjective: Patient seen and evaluated today with no new acute complaints or concerns. No acute concerns or events noted overnight.  She is having some abdominal distention and some flatulence today.  No other complaints noted with her foot.  Objective: Vitals:   07/28/20 1955 07/29/20 0216 07/29/20 0518 07/29/20 0921  BP: 115/63 (!) 106/58 (!) 94/35 116/63  Pulse: 81 77 72   Resp: 20 18 18    Temp: 98 F (36.7 C) 97.8 F (36.6 C) 97.6 F (36.4  C)   TempSrc: Oral     SpO2: 100% 95% 97%   Weight: 92.6 kg     Height: 5\' 6"  (1.676 m)       Intake/Output Summary (Last 24 hours) at 07/29/2020 1005 Last data filed at 07/29/2020 0500 Gross per 24 hour  Intake 200 ml  Output 800 ml  Net -600 ml   Filed Weights   07/28/20 1140 07/28/20 1955  Weight: 90.7 kg 92.6 kg    Examination:  General exam: Appears calm and comfortable  Respiratory system: Clear to auscultation. Respiratory effort normal. Cardiovascular system: S1 & S2 heard, RRR.  Gastrointestinal system: Abdomen is soft Central nervous system: Alert and  awake Extremities: Right foot covered in dressings clean dry and intact Skin: No significant lesions noted Psychiatry: Flat affect.    Data Reviewed: I have personally reviewed following labs and imaging studies  CBC: Recent Labs  Lab 07/28/20 1300  WBC 15.3*  NEUTROABS 13.1*  HGB 12.8  HCT 39.2  MCV 90.1  PLT 246   Basic Metabolic Panel: Recent Labs  Lab 07/28/20 1300 07/29/20 0607  NA 137 139  K 4.2 3.9  CL 102 106  CO2 23 22  GLUCOSE 152* 76  BUN 16 15  CREATININE 0.89 0.72  CALCIUM 8.4* 8.2*   GFR: Estimated Creatinine Clearance: 86.8 mL/min (by C-G formula based on SCr of 0.72 mg/dL). Liver Function Tests: Recent Labs  Lab 07/29/20 0607  AST 12*  ALT 11  ALKPHOS 75  BILITOT 0.3  PROT 6.0*  ALBUMIN 2.8*   No results for input(s): LIPASE, AMYLASE in the last 168 hours. No results for input(s): AMMONIA in the last 168 hours. Coagulation Profile: No results for input(s): INR, PROTIME in the last 168 hours. Cardiac Enzymes: No results for input(s): CKTOTAL, CKMB, CKMBINDEX, TROPONINI in the last 168 hours. BNP (last 3 results) No results for input(s): PROBNP in the last 8760 hours. HbA1C: Recent Labs    07/28/20 1300  HGBA1C 7.1*   CBG: Recent Labs  Lab 07/28/20 1859 07/28/20 2100 07/29/20 0601  GLUCAP 143* 99 84   Lipid Profile: No results for input(s): CHOL, HDL, LDLCALC, TRIG, CHOLHDL, LDLDIRECT in the last 72 hours. Thyroid Function Tests: No results for input(s): TSH, T4TOTAL, FREET4, T3FREE, THYROIDAB in the last 72 hours. Anemia Panel: No results for input(s): VITAMINB12, FOLATE, FERRITIN, TIBC, IRON, RETICCTPCT in the last 72 hours. Sepsis Labs: Recent Labs  Lab 07/28/20 1300  LATICACIDVEN 1.3    Recent Results (from the past 240 hour(s))  Aerobic Culture w Gram Stain (superficial specimen)     Status: None (Preliminary result)   Collection Time: 07/28/20 12:47 PM   Specimen: Ulcer; Wound  Result Value Ref Range Status    Specimen Description   Final    ULCER Performed at [facility], [address], [place], [place]    Special Requests   Final    NONE Performed at [facility], [address] [place], [place]    Gram Stain   Final    RARE WBC PRESENT,BOTH PMN AND MONONUCLEAR RARE GRAM POSITIVE COCCI Performed at [facility], [address], [place], [place]    Culture PENDING  Incomplete   Report Status PENDING  Incomplete  Blood culture (routine x 2)     Status: None (Preliminary result)   Collection Time: 07/28/20  1:00 PM   Specimen: BLOOD  Result Value Ref Range Status   Specimen Description BLOOD BLOOD  LEFT ARM  Final   Special Requests   Final    Blood Culture adequate volume BOTTLES DRAWN AEROBIC AND ANAEROBIC   Culture   Final    NO GROWTH < 24 HOURS Performed at [facility], [address] [place], [place] [address]    Report Status PENDING  Incomplete  Blood culture (routine x 2)     Status: None (Preliminary result)   Collection Time: 07/28/20  1:00 PM   Specimen: BLOOD  Result Value Ref Range Status   Specimen Description BLOOD LEFT ANTECUBITAL  Final   Special Requests   Final    Blood Culture adequate volume BOTTLES DRAWN AEROBIC AND ANAEROBIC   Culture   Final    NO GROWTH < 24 HOURS Performed at [facility], [address] [place], [place] [address]    Report Status PENDING  Incomplete  Resp Panel by RT-PCR (Flu A&B, Covid) Nasopharyngeal Swab     Status: None   Collection Time: 07/28/20  5:07 PM   Specimen: Nasopharyngeal Swab; Nasopharyngeal(NP) swabs in vial transport medium  Result Value Ref Range Status   SARS Coronavirus 2 by RT PCR NEGATIVE NEGATIVE Final    Comment: (NOTE) SARS-CoV-2 target nucleic acids are NOT DETECTED.  The SARS-CoV-2 RNA is generally detectable in upper respiratory specimens during the acute phase of infection. The lowest concentration of SARS-CoV-2 viral copies this assay can detect is 138 copies/mL.  A negative result does not preclude SARS-Cov-2 infection and should not be used as the sole basis for treatment or other patient management decisions. A negative result may occur with  improper specimen collection/handling, submission of specimen other than nasopharyngeal swab, presence of viral mutation(s) within the areas targeted by this assay, and inadequate number of viral copies(<138 copies/mL). A negative result must be combined with clinical observations, patient history, and epidemiological information. The expected result is Negative.  Fact Sheet for Patients:  BloggerCourse.com  Fact Sheet for Healthcare Providers:  SeriousBroker.it  This test is no t yet approved or cleared by the [place] FDA and  has been authorized for detection and/or diagnosis of SARS-CoV-2 by FDA under an Emergency Use Authorization (EUA). This EUA will remain  in effect (meaning this test can be used) for the duration of the COVID-19 declaration under Section 564(b)(1) of the Act, 21 U.S.C.section 360bbb-3(b)(1), unless the authorization is terminated  or revoked sooner.       Influenza A by PCR NEGATIVE NEGATIVE Final   Influenza B by PCR NEGATIVE NEGATIVE Final    Comment: (NOTE) The Xpert Xpress SARS-CoV-2/FLU/RSV plus assay is intended as an aid in the diagnosis of influenza from Nasopharyngeal swab specimens and should not be used as a sole basis for treatment. Nasal washings and aspirates are unacceptable for Xpert Xpress SARS-CoV-2/FLU/RSV testing.  Fact Sheet for Patients: BloggerCourse.com  Fact Sheet for Healthcare Providers: SeriousBroker.it  This test is not yet approved or cleared by the [place] FDA and has been authorized for detection and/or diagnosis of SARS-CoV-2 by FDA under an Emergency Use Authorization (EUA). This EUA will remain in effect (meaning this test  can be used) for the duration of the COVID-19 declaration under Section 564(b)(1) of the Act, 21 U.S.C. section 360bbb-3(b)(1), unless the authorization is terminated or revoked.  Performed at [facility], [address], [place], [place] [address]   MRSA PCR Screening     Status: None   Collection Time: 07/28/20  5:42 PM   Specimen: Nasopharyngeal  Result Value Ref  Range Status   MRSA by PCR NEGATIVE NEGATIVE Final    Comment:        The GeneXpert MRSA Assay (FDA approved for NASAL specimens only), is one component of a comprehensive MRSA colonization surveillance program. It is not intended to diagnose MRSA infection nor to guide or monitor treatment for MRSA infections. Performed at [facility], [address] [place], [place] [id number]          Radiology Studies: DG Foot Complete Right  Result Date: 07/28/2020 CLINICAL DATA:  Right foot ulceration. History of osteomyelitis of the fifth metatarsal head EXAM: RIGHT FOOT COMPLETE - 3+ VIEW COMPARISON:  MRI 05/08/2020 FINDINGS: Progressive bony erosion of the fifth metatarsal head and neck compatible with acute osteomyelitis. Suspect subtle erosion of the base of the fifth toe proximal phalanx. There is dorsal dislocation of the fifth MTP joint. Adjacent soft tissue ulceration with the lateral aspect of the fifth metatarsal head closely approximating the ulcer base, and may be exposed. Osteopenia of the fourth metatarsal head without definite erosion. Scattered degenerative changes. Prominent plantar calcaneal spur. Vascular calcifications. IMPRESSION: 1. Progressive bony erosion of the fifth metatarsal head and neck compatible with acute osteomyelitis. Suspect osteomyelitis of the base of the fifth toe proximal phalanx. 2. Dorsal dislocation of the fifth MTP joint. 3. Adjacent soft tissue ulceration with the lateral aspect of the fifth metatarsal head closely approximating the ulcer base, and may be exposed. Electronically Signed    By: Duanne GuessNicholas  Plundo D.O.   On: 07/28/2020 13:48        Scheduled Meds: . aspirin EC  81 mg Oral Daily  . atorvastatin  80 mg Oral Daily  . citalopram  40 mg Oral Daily  . enoxaparin (LOVENOX) injection  40 mg Subcutaneous Q24H  . insulin aspart  0-15 Units Subcutaneous TID WC  . insulin aspart  0-5 Units Subcutaneous QHS  . insulin detemir  30 Units Subcutaneous QHS  . lisinopril  20 mg Oral Daily  . melatonin  6 mg Oral QHS   Continuous Infusions: . cefTRIAXone (ROCEPHIN)  IV 2 g (07/28/20 2100)  . metronidazole 500 mg (07/29/20 0928)     LOS: 1 day    Time spent: 35 minutes    Pratik D Sherryll BurgerShah, DO [facility]  If 7PM-7AM, please contact night-coverage [email or web address] 07/29/2020, 10:05 AM

## 2020-07-29 NOTE — Progress Notes (Addendum)
Initial Nutrition Assessment  DOCUMENTATION CODES:   Obesity unspecified  INTERVENTION:    Ensure Max po BID, each supplement provides 150 kcal and 30 grams of protein.   MVI with minerals daily.  NUTRITION DIAGNOSIS:   Increased nutrient needs related to wound healing as evidenced by estimated needs.  GOAL:   Patient will meet greater than or equal to 90% of their needs  MONITOR:   PO intake,Supplement acceptance,Skin  REASON FOR ASSESSMENT:   Consult Wound healing  ASSESSMENT:   60 yo female admitted with osteomyelitis of fifth metatarsal of the R foot. PMH includes DM-2, HTN, HLD, neuropathy, PAD, recent fem-pop bypass January 2022, chronic foot ulcers.   Patient reports recent decrease appetite and intake. She is currently on a CHO modified diet. Meal intakes: 50% of breakfast today.  Discussed importance of adequate protein intake to promote wound healing. Will add protein supplement to increase protein intake.  Weight encounters reviewed. No significant weight changes noted.  Labs reviewed.  CBG: 84-124  Medications reviewed and include Novolog, Levemir.   NUTRITION - FOCUSED PHYSICAL EXAM:  unable to complete  Diet Order:   Diet Order            Diet Carb Modified Fluid consistency: Thin; Room service appropriate? Yes  Diet effective now                 EDUCATION NEEDS:   Education needs have been addressed  Skin:     Last BM:     Height:   Ht Readings from Last 1 Encounters:  07/28/20 5\' 6"  (1.676 m)    Weight:   Wt Readings from Last 1 Encounters:  07/28/20 92.6 kg    Ideal Body Weight:  59.1 kg  BMI:  Body mass index is 32.95 kg/m.  Estimated Nutritional Needs:   Kcal:  1950-2150  Protein:  90-110 gm  Fluid:  >/= 1.9 L    07/30/20, RD, LDN, CNSC Please refer to Amion for contact information.

## 2020-07-30 DIAGNOSIS — M86171 Other acute osteomyelitis, right ankle and foot: Secondary | ICD-10-CM | POA: Diagnosis not present

## 2020-07-30 LAB — GLUCOSE, CAPILLARY
Glucose-Capillary: 130 mg/dL — ABNORMAL HIGH (ref 70–99)
Glucose-Capillary: 258 mg/dL — ABNORMAL HIGH (ref 70–99)
Glucose-Capillary: 58 mg/dL — ABNORMAL LOW (ref 70–99)
Glucose-Capillary: 131 mg/dL — ABNORMAL HIGH (ref 70–99)
Glucose-Capillary: 123 mg/dL — ABNORMAL HIGH (ref 70–99)

## 2020-07-30 LAB — BASIC METABOLIC PANEL
Anion gap: 10 (ref 5–15)
BUN: 17 mg/dL (ref 6–20)
CO2: 24 mmol/L (ref 22–32)
Calcium: 8.8 mg/dL — ABNORMAL LOW (ref 8.9–10.3)
Chloride: 107 mmol/L (ref 98–111)
Creatinine, Ser: 0.84 mg/dL (ref 0.44–1.00)
GFR, Estimated: >60 mL/min (ref >60)
Glucose, Bld: 88 mg/dL (ref 70–99)
Potassium: 4.1 mmol/L (ref 3.5–5.1)
Sodium: 141 mmol/L (ref 135–145)

## 2020-07-30 LAB — CBC
HCT: 41.1 % (ref 36.0–46.0)
Hemoglobin: 13.3 g/dL (ref 12.0–15.0)
MCH: 29.6 pg (ref 26.0–34.0)
MCHC: 32.4 g/dL (ref 30.0–36.0)
MCV: 91.3 fL (ref 80.0–100.0)
Platelets: 253 10*3/uL (ref 150–400)
RBC: 4.5 MIL/uL (ref 3.87–5.11)
RDW: 14.2 % (ref 11.5–15.5)
WBC: 8.4 10*3/uL (ref 4.0–10.5)
nRBC: 0 % (ref 0.0–0.2)

## 2020-07-30 LAB — AEROBIC CULTURE W GRAM STAIN (SUPERFICIAL SPECIMEN): Culture: NORMAL

## 2020-07-30 LAB — MAGNESIUM: Magnesium: 1.9 mg/dL (ref 1.7–2.4)

## 2020-07-30 LAB — SURGICAL PCR SCREEN
MRSA, PCR: NEGATIVE
Staphylococcus aureus: NEGATIVE

## 2020-07-30 NOTE — Progress Notes (Signed)
PROGRESS NOTE    TAJI SATHER  VQQ:[id number] DOB: 07-Feb-1961 DOA: 07/28/2020 PCP: Kirstie Peri, MD   Brief Narrative:   [name] a 60 y.o.femalewith a history of diabetes type 2 on oral hypoglycemics, hypertension, hyperlipidemia, peripheral artery disease with a recent femoropopliteal bypass with Dr. Chestine Spore on 05/2020, with chronic foot ulcers followed by Dr. Allena Katz. She has had this wound for 3 to 4 months and goes to the wound care clinic. 3 to 4 days ago, the patient was walking and felt that pop on her right foot with onset of pain.  Patient was admitted with osteomyelitis of the fifth metatarsal of the right foot in the setting of diabetes with neuropathy.  Plan is for operative intervention on 3/28 with antibiotics that have been ordered for ongoing treatment until then.  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 (HCC)   Chronic venous insufficiency   PAD (peripheral artery disease) (HCC)   Diabetic foot ulcer (HCC)   Diabetes mellitus without complication (HCC)   Hypertension   Acute osteomyelitis of metatarsal bone of right foot (HCC)   Acute osteomyelitis of the right fifth toe -With associated cellulitis of right foot in the setting of chronic ulceration -Continue on Rocephin and Flagyl as prescribed -Blood cultures with no noted growth x2 days -Podiatry planning for amputation on 07/31/2020 -May potentially require PICC line placement and long-term IV antibiotics after surgery -MRSA PCR negative  Type 2 diabetes with neuropathy -Hemoglobin A1c 7.1% -Is well controlled on long-acting insulin and SSI  Hypertension -Currently with soft blood pressure readings -Plan to hold lisinopril for now and monitor closely  Peripheral arterial disease/chronic venous insufficiency -ABI pending  DVT prophylaxis: Lovenox Code Status: Full Family Communication: None at bedside, patient will  call Disposition Plan:  Status is: Inpatient  Remains inpatient appropriate because:IV treatments appropriate due to intensity of illness or inability to take PO and Inpatient level of care appropriate due to severity of illness   Dispo: The patient is from: Home  Anticipated d/c is to: Home  Patient currently is not medically stable to d/c.              Difficult to place patient No   Consultants:   Podiatry  Procedures:   See below  Antimicrobials:  Anti-infectives (From admission, onward)   Start     Dose/Rate Route Frequency Ordered Stop   07/28/20 1815  cefTRIAXone (ROCEPHIN) 2 g in sodium chloride 0.9 % 100 mL IVPB        2 g 200 mL/hr over 30 Minutes Intravenous Every 24 hours 07/28/20 1743     07/28/20 1800  metroNIDAZOLE (FLAGYL) IVPB 500 mg        500 mg 100 mL/hr over 60 Minutes Intravenous Every 8 hours 07/28/20 1743         Subjective: Patient seen and evaluated today with no new acute complaints or concerns. No acute concerns or events noted overnight. She has slept well and pain control has improved with toradol.  Objective: Vitals:   07/29/20 0921 07/29/20 1303 07/29/20 2107 07/30/20 0510  BP: 116/63 115/66 127/72 106/66  Pulse:  69 71 66  Resp:  16  20  Temp:  97.8 F (36.6 C) 98.3 F (36.8 C) 97.8 F (36.6 C)  TempSrc:  Oral Oral Oral  SpO2:  100% 94% 96%  Weight:      Height:  Intake/Output Summary (Last 24 hours) at 07/30/2020 0954 Last data filed at 07/30/2020 0557 Gross per 24 hour  Intake 300 ml  Output 2850 ml  Net -2550 ml   Filed Weights   07/28/20 1140 07/28/20 1955  Weight: 90.7 kg 92.6 kg    Examination:  General exam: Appears calm and comfortable  Respiratory system: Clear to auscultation. Respiratory effort normal. Cardiovascular system: S1 & S2 heard, RRR.  Gastrointestinal system: Abdomen is soft Central nervous system: Alert and awake Extremities: No edema, right foot in  dressings, c/d/i. Skin: No significant lesions noted Psychiatry: Flat affect.    Data Reviewed: I have personally reviewed following labs and imaging studies  CBC: Recent Labs  Lab 07/28/20 1300 07/30/20 0519  WBC 15.3* 8.4  NEUTROABS 13.1*  --   HGB 12.8 13.3  HCT 39.2 41.1  MCV 90.1 91.3  PLT 246 253   Basic Metabolic Panel: Recent Labs  Lab 07/28/20 1300 07/29/20 0607 07/30/20 0519  NA 137 139 141  K 4.2 3.9 4.1  CL 102 106 107  CO2 GLUCOSE 152* 76 88  BUN CREATININE 0.89 0.72 0.84  CALCIUM 8.4* 8.2* 8.8*  MG  --   --  1.9   GFR: Estimated Creatinine Clearance: 82.6 mL/min (by C-G formula based on SCr of 0.84 mg/dL). Liver Function Tests: Recent Labs  Lab 07/29/20 0607  AST 12*  ALT 11  ALKPHOS 75  BILITOT 0.3  PROT 6.0*  ALBUMIN 2.8*   No results for input(s): LIPASE, AMYLASE in the last 168 hours. No results for input(s): AMMONIA in the last 168 hours. Coagulation Profile: No results for input(s): INR, PROTIME in the last 168 hours. Cardiac Enzymes: No results for input(s): CKTOTAL, CKMB, CKMBINDEX, TROPONINI in the last 168 hours. BNP (last 3 results) No results for input(s): PROBNP in the last 8760 hours. HbA1C: Recent Labs    07/28/20 1300  HGBA1C 7.1*   CBG: Recent Labs  Lab 07/29/20 0601 07/29/20 1114 07/29/20 1730 07/29/20 2027 07/30/20 0748  GLUCAP 84 124* 116* 125* 130*   Lipid Profile: No results for input(s): CHOL, HDL, LDLCALC, TRIG, CHOLHDL, LDLDIRECT in the last 72 hours. Thyroid Function Tests: No results for input(s): TSH, T4TOTAL, FREET4, T3FREE, THYROIDAB in the last 72 hours. Anemia Panel: No results for input(s): VITAMINB12, FOLATE, FERRITIN, TIBC, IRON, RETICCTPCT in the last 72 hours. Sepsis Labs: Recent Labs  Lab 07/28/20 1300  LATICACIDVEN 1.3    Recent Results (from the past 240 hour(s))  Aerobic Culture w Gram Stain (superficial specimen)     Status: None (Preliminary result)    Collection Time: 07/28/20 12:47 PM   Specimen: Ulcer; Wound  Result Value Ref Range Status   Specimen Description   Final    ULCER Performed at [facility], [address] [place], [place] [address]    Special Requests   Final    NONE Performed at [facility], [address], [place], [place] [address]    Gram Stain   Final    RARE WBC PRESENT,BOTH PMN AND MONONUCLEAR RARE GRAM POSITIVE COCCI    Culture   Final    CULTURE REINCUBATED FOR BETTER GROWTH Performed at [facility], [address], [place], [place] [address]    Report Status PENDING  Incomplete  Blood culture (routine x 2)     Status: None (Preliminary result)   Collection Time: 07/28/20  1:00 PM   Specimen: BLOOD  Result Value  Ref Range Status   Specimen Description BLOOD BLOOD LEFT ARM  Final   Special Requests   Final    Blood Culture adequate volume BOTTLES DRAWN AEROBIC AND ANAEROBIC   Culture   Final    NO GROWTH 2 DAYS Performed at [facility], [address], [place], [place] [address]    Report Status PENDING  Incomplete  Blood culture (routine x 2)     Status: None (Preliminary result)   Collection Time: 07/28/20  1:00 PM   Specimen: BLOOD  Result Value Ref Range Status   Specimen Description BLOOD LEFT ANTECUBITAL  Final   Special Requests   Final    Blood Culture adequate volume BOTTLES DRAWN AEROBIC AND ANAEROBIC   Culture   Final    NO GROWTH 2 DAYS Performed at [facility], [address] [place], [place] [address]    Report Status PENDING  Incomplete  Resp Panel by RT-PCR (Flu A&B, Covid) Nasopharyngeal Swab     Status: None   Collection Time: 07/28/20  5:07 PM   Specimen: Nasopharyngeal Swab; Nasopharyngeal(NP) swabs in vial transport medium  Result Value Ref Range Status   SARS Coronavirus 2 by RT PCR NEGATIVE NEGATIVE Final    Comment: (NOTE) SARS-CoV-2 target nucleic acids are NOT DETECTED.  The SARS-CoV-2 RNA is generally detectable in upper respiratory specimens during the  acute phase of infection. The lowest concentration of SARS-CoV-2 viral copies this assay can detect is 138 copies/mL. A negative result does not preclude SARS-Cov-2 infection and should not be used as the sole basis for treatment or other patient management decisions. A negative result may occur with  improper specimen collection/handling, submission of specimen other than nasopharyngeal swab, presence of viral mutation(s) within the areas targeted by this assay, and inadequate number of viral copies(<138 copies/mL). A negative result must be combined with clinical observations, patient history, and epidemiological information. The expected result is Negative.  Fact Sheet for Patients:  BloggerCourse.com  Fact Sheet for Healthcare Providers:  SeriousBroker.it  This test is no t yet approved or cleared by the [place] FDA and  has been authorized for detection and/or diagnosis of SARS-CoV-2 by FDA under an Emergency Use Authorization (EUA). This EUA will remain  in effect (meaning this test can be used) for the duration of the COVID-19 declaration under Section 564(b)(1) of the Act, 21 U.S.C.section 360bbb-3(b)(1), unless the authorization is terminated  or revoked sooner.       Influenza A by PCR NEGATIVE NEGATIVE Final   Influenza B by PCR NEGATIVE NEGATIVE Final    Comment: (NOTE) The Xpert Xpress SARS-CoV-2/FLU/RSV plus assay is intended as an aid in the diagnosis of influenza from Nasopharyngeal swab specimens and should not be used as a sole basis for treatment. Nasal washings and aspirates are unacceptable for Xpert Xpress SARS-CoV-2/FLU/RSV testing.  Fact Sheet for Patients: BloggerCourse.com  Fact Sheet for Healthcare Providers: SeriousBroker.it  This test is not yet approved or cleared by the [place] FDA and has been authorized for detection and/or  diagnosis of SARS-CoV-2 by FDA under an Emergency Use Authorization (EUA). This EUA will remain in effect (meaning this test can be used) for the duration of the COVID-19 declaration under Section 564(b)(1) of the Act, 21 U.S.C. section 360bbb-3(b)(1), unless the authorization is terminated or revoked.  Performed at [facility], [address], [place], [place] [address]   MRSA PCR Screening     Status: None   Collection Time: 07/28/20  5:42 PM  Specimen: Nasopharyngeal  Result Value Ref Range Status   MRSA by PCR NEGATIVE NEGATIVE Final    Comment:        The GeneXpert MRSA Assay (FDA approved for NASAL specimens only), is one component of a comprehensive MRSA colonization surveillance program. It is not intended to diagnose MRSA infection nor to guide or monitor treatment for MRSA infections. Performed at [facility], [address] [place], [place] [id number]          Radiology Studies: MR FOOT RIGHT W WO CONTRAST  Result Date: 07/29/2020 CLINICAL DATA:  Right forefoot osteomyelitis EXAM: MRI OF THE RIGHT FOREFOOT WITHOUT AND WITH CONTRAST TECHNIQUE: Multiplanar, multisequence MR imaging of the right forefoot was performed before and after the administration of intravenous contrast. CONTRAST:  10mL GADAVIST GADOBUTROL 1 MMOL/ML IV SOLN COMPARISON:  X-ray 09/27/2020, MRI 05/08/2020 FINDINGS: Technical note: Motion degraded exam. Patient unable to tolerate repeat sequences. Best possible images were obtained and submitted for interpretation. Bones/Joint/Cartilage Progressive destruction of the fifth metatarsal head with confluent low T1 signal changes compatible with acute osteomyelitis. Marrow edema and enhancement extends approximately 3.0 cm proximally to the level of the distal diaphysis. This has progressed from the previous MRI. New acute osteomyelitis involving the base of the fifth toe proximal phalanx (series 3, image 12). Fifth MTP joint remains dorsally dislocated  (series 7, image 3). Bone marrow edema within the second metatarsal head (series 4, image 12) with enhancement and patchy low T1 marrow signal is concerning for osteomyelitis. There is marrow edema within the adjacent second toe proximal phalanx base without associated T1 marrow signal abnormality, this may reflect reactive osteitis. There is also mild marrow edema within the third and fourth metatarsal heads with preservation of the T1 marrow signal, likely reactive. No acute fracture.  Flexion deformities of the lesser toes. Ligaments Intact Lisfranc ligament. Capsular disruption of the fifth MTP joint. Remaining collateral ligaments intact. Muscles and Tendons Fatty infiltration of the intrinsic foot musculature. No tenosynovial fluid collection. Soft tissues Soft tissue ulceration along the plantar-lateral aspect of the forefoot at the level of the fifth MTP joint. Fifth metatarsal head is likely exposed at the ulcer base. There is surrounding soft tissue edema and enhancement with rim enhancing fluid collection extending proximally to the level of the fifth metatarsal neck measuring approximately 1.8 x 0.5 x 1.5 cm (series 9, image 23). Diffuse dorsal soft tissue edema. IMPRESSION: 1. Soft tissue ulceration along the plantar-lateral aspect of the forefoot at the level of the fifth MTP joint. Fifth metatarsal head is likely exposed at the ulcer base. Small abscess extending proximally to the level of the fifth metatarsal neck. 2. Progressive acute osteomyelitis of the fifth metatarsal head and neck as well as the base of the fifth toe proximal phalanx. 3. Marrow edema within the second metatarsal head with patchy low T1 marrow signal, concerning for osteomyelitis. Mild marrow edema within the adjacent second toe proximal phalanx base without associated T1 marrow signal abnormality, this may reflect reactive osteitis. 4. Mild marrow edema within the third and fourth metatarsal heads, likely reactive.  Electronically Signed   By: Duanne GuessNicholas  Plundo D.O.   On: 07/29/2020 11:25   DG Foot Complete Right  Result Date: 07/28/2020 CLINICAL DATA:  Right foot ulceration. History of osteomyelitis of the fifth metatarsal head EXAM: RIGHT FOOT COMPLETE - 3+ VIEW COMPARISON:  MRI 05/08/2020 FINDINGS: Progressive bony erosion of the fifth metatarsal head and neck compatible with acute osteomyelitis. Suspect subtle erosion of the base  of the fifth toe proximal phalanx. There is dorsal dislocation of the fifth MTP joint. Adjacent soft tissue ulceration with the lateral aspect of the fifth metatarsal head closely approximating the ulcer base, and may be exposed. Osteopenia of the fourth metatarsal head without definite erosion. Scattered degenerative changes. Prominent plantar calcaneal spur. Vascular calcifications. IMPRESSION: 1. Progressive bony erosion of the fifth metatarsal head and neck compatible with acute osteomyelitis. Suspect osteomyelitis of the base of the fifth toe proximal phalanx. 2. Dorsal dislocation of the fifth MTP joint. 3. Adjacent soft tissue ulceration with the lateral aspect of the fifth metatarsal head closely approximating the ulcer base, and may be exposed. Electronically Signed   By: Duanne Guess D.O.   On: 07/28/2020 13:48        Scheduled Meds: . aspirin EC  81 mg Oral Daily  . atorvastatin  80 mg Oral Daily  . citalopram  40 mg Oral Daily  . enoxaparin (LOVENOX) injection  40 mg Subcutaneous Q24H  . insulin aspart  0-15 Units Subcutaneous TID WC  . insulin aspart  0-5 Units Subcutaneous QHS  . insulin detemir  30 Units Subcutaneous QHS  . melatonin  6 mg Oral QHS  . multivitamin with minerals  1 tablet Oral Daily  . Ensure Max Protein  11 oz Oral BID   Continuous Infusions: . cefTRIAXone (ROCEPHIN)  IV 2 g (07/29/20 1847)  . metronidazole 500 mg (07/30/20 0817)     LOS: 2 days    Time spent: 35 minutes    Pratik D Sherryll Burger, DO [facility]  If 7PM-7AM,  please contact night-coverage [email or web address] 07/30/2020, 9:54 AM

## 2020-07-30 NOTE — Progress Notes (Signed)
S: Patient was seen at bedside today. Resting comfortably. Denies any nausea, vomiting, fever or chills. Patient still complaining of throbbing pain at the right foot ulcer.   O: Derm: Right foot: There is full thickness ulcer noted on the right sub 5th met head measuring 1.3x0.8x0.5. There is exposed bone noted at the ulcer site with erosive changes noted. There is serosanguinous drainage noted. Increase in warmth noted at the ulcer site with localized redness. No soft tissue crepitus felt. There is also ulcer noted on the left foot sub 2nd met head with granular wound base measuring 0.9x0.5x0.3. No drainage noted. Does not probe to bone. Contracted lesser digits noted on both feet.  Vascular: pedal pulses palpable. Capillary refill is brisk to lesser toes.  Musculoskeletal: Muscle strength is 5/5.  Neuro: Protective sensation is absent at the metatarsal level.   Assessment/Plan:   Acute osteomyelitis of the right fifth toe. Chronic ulceration right fifth toe.  Cellulitis of the right foot.  Chronic ulceration of the left foot.  Diabetes with neuropathy.   Plan:  Patient examined and evaluated. Dry sterile dressing applied. Given that she has failed multiple wound care treatments and exposed bone, I suggested her partial 5th ray amputation of the right foot vs metatarsal head resection. She is agreeing to both.  NPO midnight tonight.  Patient is on schedule for surgery on 07/31/20 for 12:30.  Patient will need PICC line after the surgery for 3-4 weeks. Hold Lovenox tonight please.  Discussed possibility of wound Vac if I do not close her amputation site.   I will follow up with patient while she is in hospital.

## 2020-07-31 ENCOUNTER — Encounter (HOSPITAL_COMMUNITY)
Admission: EM | Disposition: A | Discharge status: Home Health | Payer: Self-pay | Source: Home / Self Care | Attending: Internal Medicine

## 2020-07-31 ENCOUNTER — Inpatient Hospital Stay (HOSPITAL_COMMUNITY): Payer: No Typology Code available for payment source | Admitting: Anesthesiology

## 2020-07-31 ENCOUNTER — Inpatient Hospital Stay: Payer: Self-pay

## 2020-07-31 ENCOUNTER — Inpatient Hospital Stay (HOSPITAL_COMMUNITY): Payer: No Typology Code available for payment source

## 2020-07-31 DIAGNOSIS — M86171 Other acute osteomyelitis, right ankle and foot: Secondary | ICD-10-CM | POA: Diagnosis not present

## 2020-07-31 HISTORY — PX: AMPUTATION TOE: SHX6595

## 2020-07-31 LAB — BASIC METABOLIC PANEL
Anion gap: 12 (ref 5–15)
BUN: 14 mg/dL (ref 6–20)
CO2: 23 mmol/L (ref 22–32)
Calcium: 8.5 mg/dL — ABNORMAL LOW (ref 8.9–10.3)
Chloride: 104 mmol/L (ref 98–111)
Creatinine, Ser: 0.92 mg/dL (ref 0.44–1.00)
GFR, Estimated: >60 mL/min (ref >60)
Glucose, Bld: 84 mg/dL (ref 70–99)
Potassium: 4.2 mmol/L (ref 3.5–5.1)
Sodium: 139 mmol/L (ref 135–145)

## 2020-07-31 LAB — GLUCOSE, CAPILLARY
Glucose-Capillary: 100 mg/dL — ABNORMAL HIGH (ref 70–99)
Glucose-Capillary: 105 mg/dL — ABNORMAL HIGH (ref 70–99)
Glucose-Capillary: 103 mg/dL — ABNORMAL HIGH (ref 70–99)
Glucose-Capillary: 77 mg/dL (ref 70–99)
Glucose-Capillary: 128 mg/dL — ABNORMAL HIGH (ref 70–99)
Glucose-Capillary: 156 mg/dL — ABNORMAL HIGH (ref 70–99)
Glucose-Capillary: 160 mg/dL — ABNORMAL HIGH (ref 70–99)

## 2020-07-31 LAB — CBC
HCT: 39 % (ref 36.0–46.0)
Hemoglobin: 12.1 g/dL (ref 12.0–15.0)
MCH: 28.3 pg (ref 26.0–34.0)
MCHC: 31 g/dL (ref 30.0–36.0)
MCV: 91.1 fL (ref 80.0–100.0)
Platelets: 253 10*3/uL (ref 150–400)
RBC: 4.28 MIL/uL (ref 3.87–5.11)
RDW: 14 % (ref 11.5–15.5)
WBC: 8.2 10*3/uL (ref 4.0–10.5)
nRBC: 0 % (ref 0.0–0.2)

## 2020-07-31 LAB — MAGNESIUM: Magnesium: 1.9 mg/dL (ref 1.7–2.4)

## 2020-07-31 IMAGING — DX DG FOOT COMPLETE 3+V*R*
3 series · 3 of 3 positions shown · non-contrast
Comparison: MRI right foot pain and right foot x-rays dated [DATE].

CLINICAL DATA: Amputation.

EXAM:
RIGHT FOOT COMPLETE - 3+ VIEW

[foot ap]
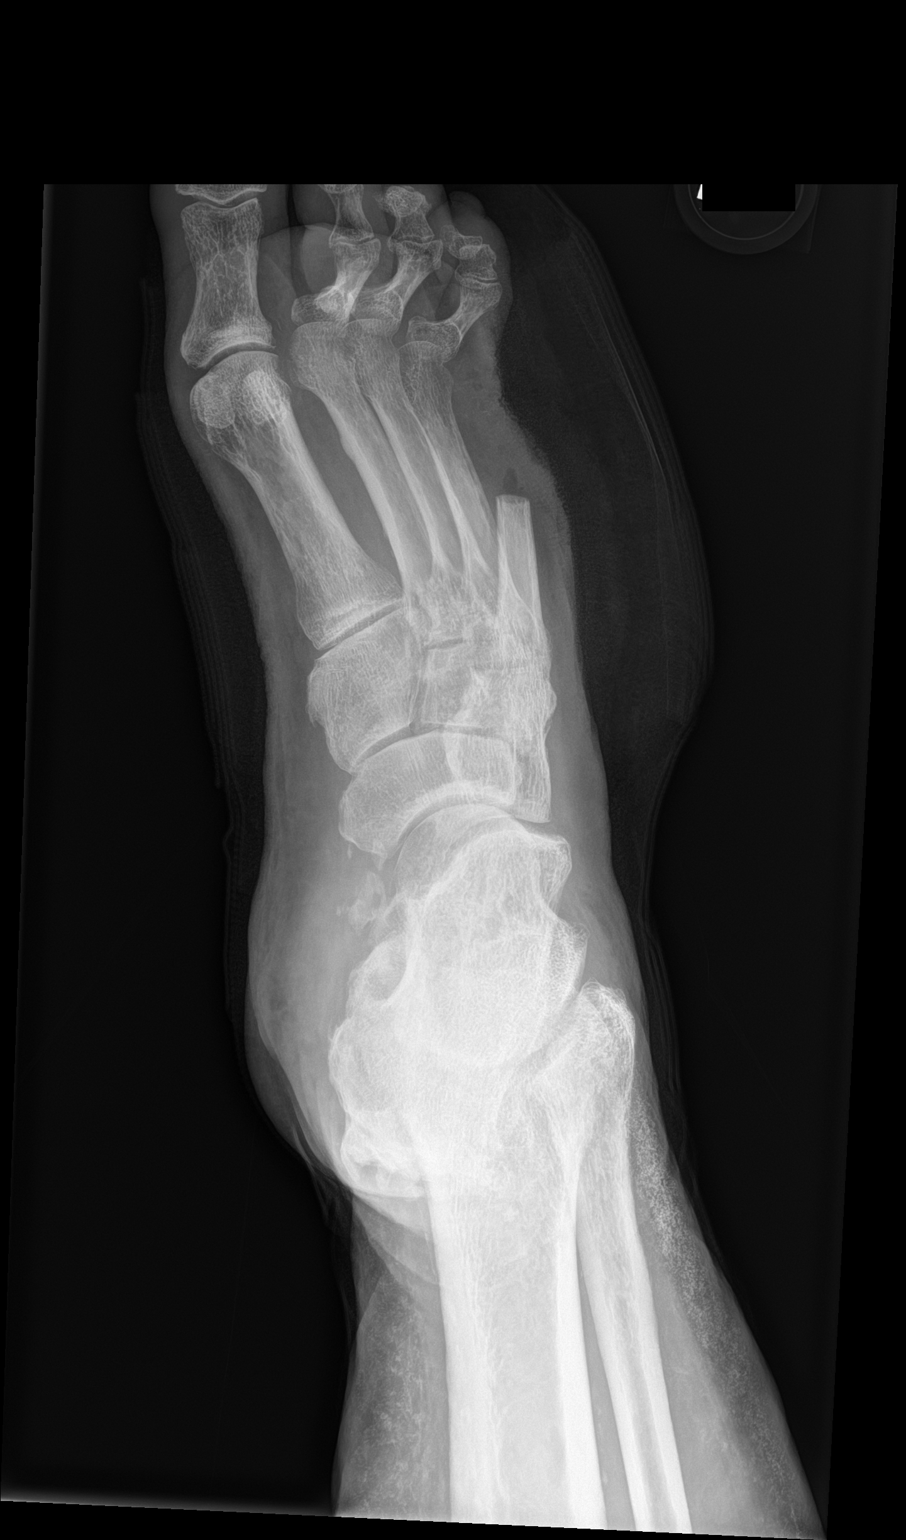

[foot obl]
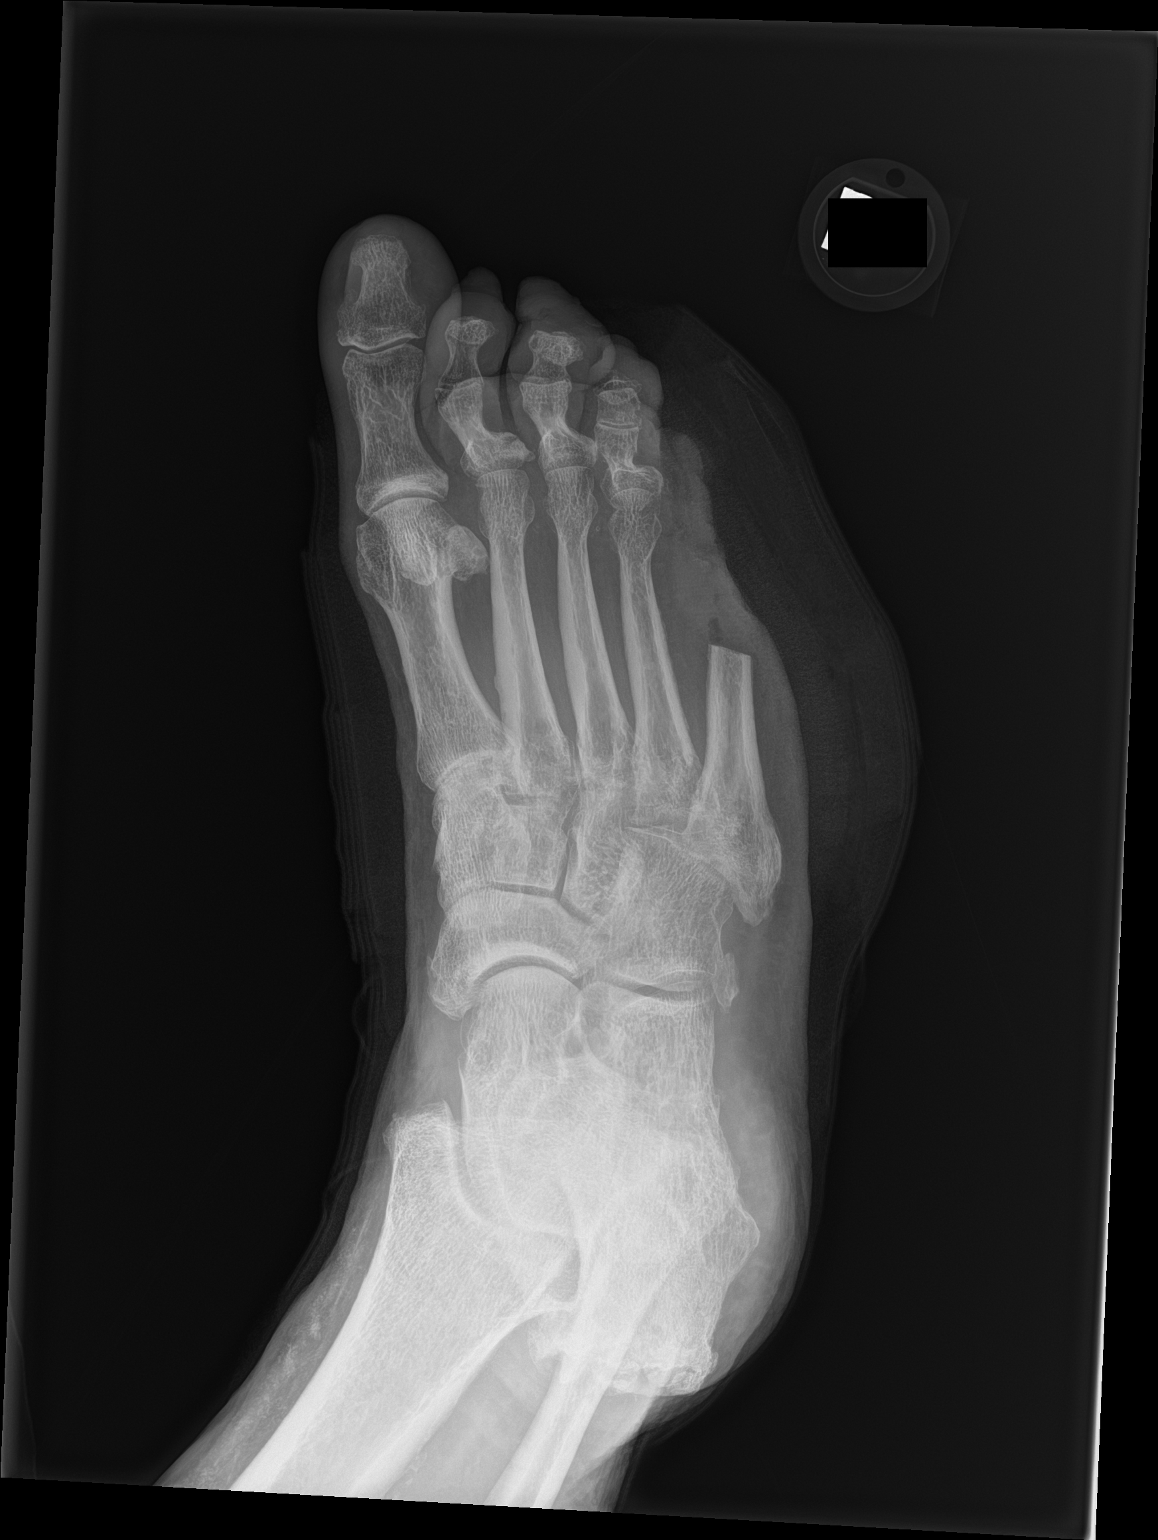

[foot lat]
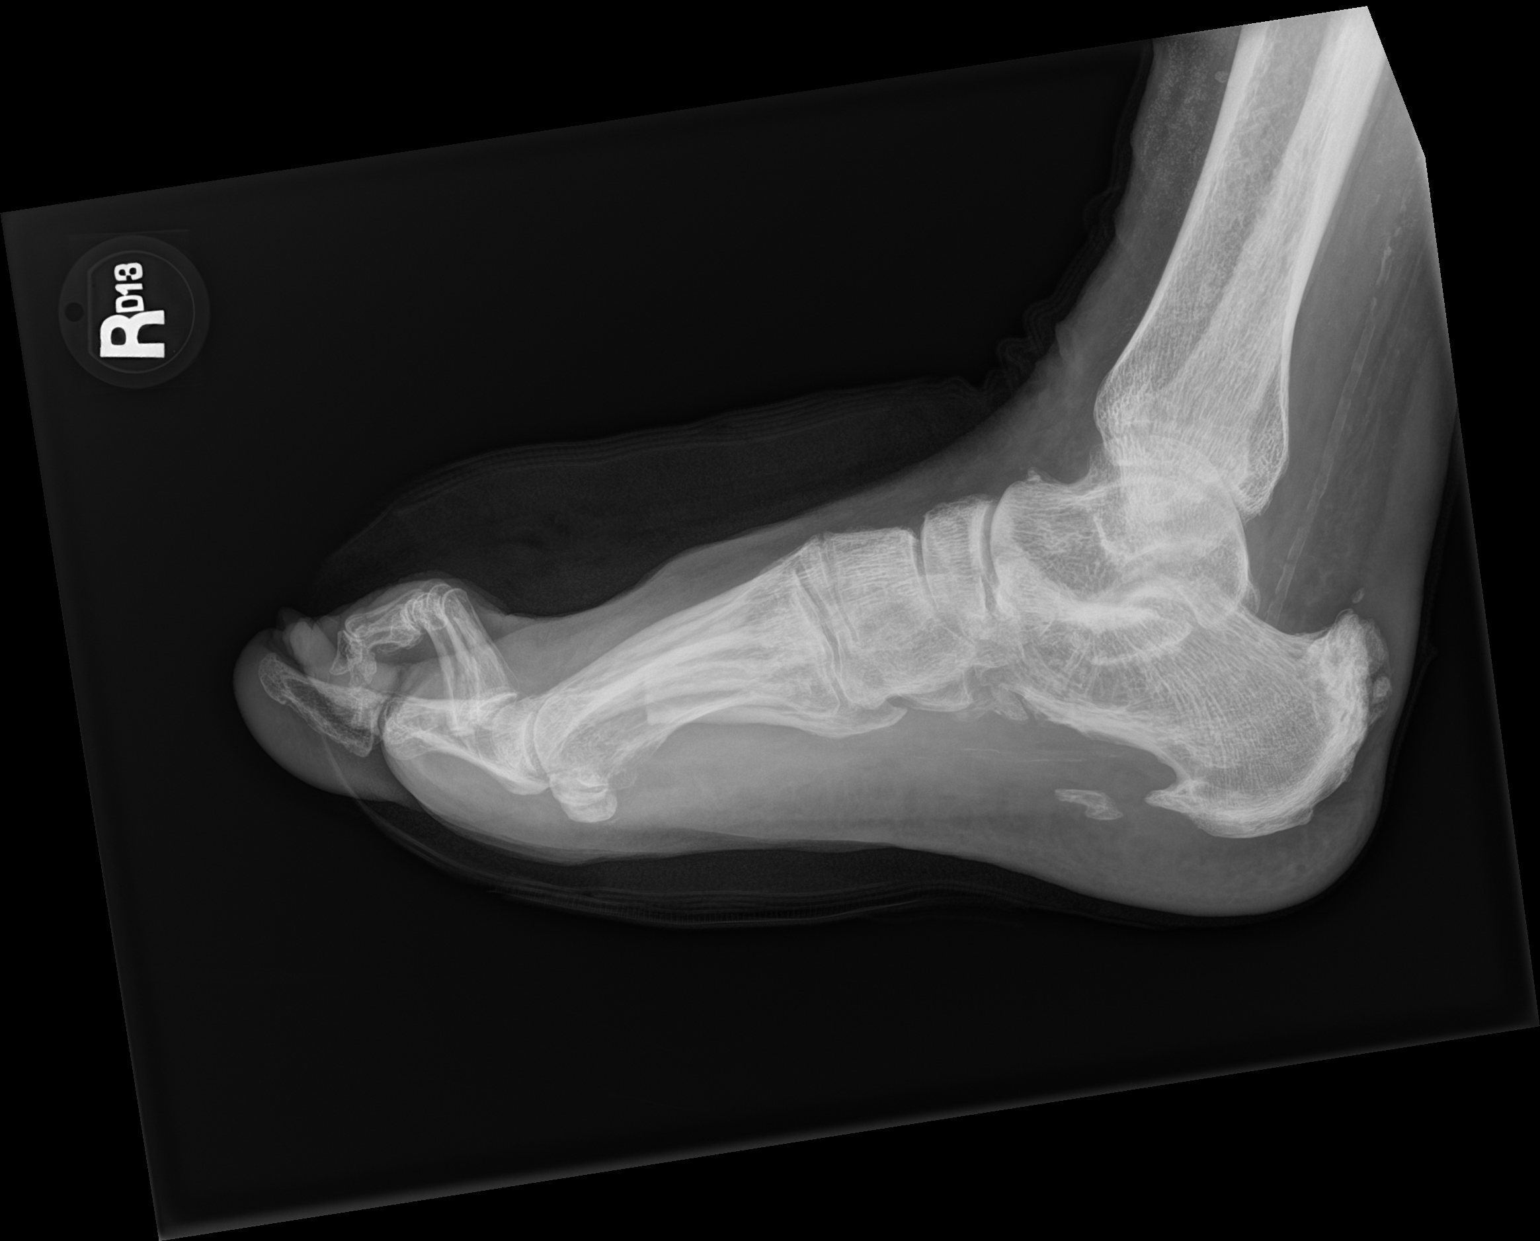

[3 of 3 positions shown; findings below may reference images not displayed]

FINDINGS: Interval fifth ray amputation with expected postsurgical changes and
small amount of subcutaneous emphysema. No acute fracture or
dislocation. Scattered degenerative changes.
IMPRESSION: 1. Interval fifth ray amputation.

## 2020-07-31 SURGERY — AMPUTATION, TOE
Anesthesia: General | Site: Toe | Laterality: Right

## 2020-07-31 MED ORDER — PROPOFOL 500 MG/50ML IV EMUL
INTRAVENOUS | Status: DC | PRN
  Administered 2020-07-31: 150 ug/kg/min via INTRAVENOUS

## 2020-07-31 MED ORDER — KETAMINE HCL 10 MG/ML IJ SOLN
INTRAMUSCULAR | Status: DC | PRN
  Administered 2020-07-31: 15 mg via INTRAVENOUS

## 2020-07-31 MED ORDER — SODIUM CHLORIDE 0.9% FLUSH
10.0000 mL | Freq: Two times a day (BID) | INTRAVENOUS | Status: DC
  Administered 2020-07-31 – 2020-08-01 (×2): 10 mL

## 2020-07-31 MED ORDER — SODIUM CHLORIDE 0.9% FLUSH: 10.0000 mL | INTRAVENOUS | Status: DC | PRN

## 2020-07-31 MED ORDER — FENTANYL CITRATE (PF) 100 MCG/2ML IJ SOLN: 25.0000 ug | INTRAMUSCULAR | Status: DC | PRN

## 2020-07-31 MED ORDER — ONDANSETRON HCL 4 MG/2ML IJ SOLN: 4.0000 mg | Freq: Once | INTRAMUSCULAR | Status: DC | PRN

## 2020-07-31 MED ORDER — MIDAZOLAM HCL 2 MG/2ML IJ SOLN
INTRAMUSCULAR | Status: AC
  Filled 2020-07-31: qty 2

## 2020-07-31 MED ORDER — LACTATED RINGERS IV SOLN: INTRAVENOUS | Status: DC

## 2020-07-31 MED ORDER — CHLORHEXIDINE GLUCONATE CLOTH 2 % EX PADS
6.0000 | MEDICATED_PAD | Freq: Every day | CUTANEOUS | Status: DC
  Administered 2020-07-31: 6 via TOPICAL

## 2020-07-31 MED ORDER — CHLORHEXIDINE GLUCONATE 0.12 % MT SOLN
15.0000 mL | Freq: Once | OROMUCOSAL | Status: AC
  Administered 2020-07-31: 15 mL via OROMUCOSAL

## 2020-07-31 MED ORDER — ORAL CARE MOUTH RINSE: 15.0000 mL | Freq: Once | OROMUCOSAL | Status: AC

## 2020-07-31 MED ORDER — 0.9 % SODIUM CHLORIDE (POUR BTL) OPTIME
TOPICAL | Status: DC | PRN
  Administered 2020-07-31: 1000 mL

## 2020-07-31 MED ORDER — INSULIN DETEMIR 100 UNIT/ML ~~LOC~~ SOLN
27.0000 [IU] | Freq: Every day | SUBCUTANEOUS | Status: DC
  Administered 2020-07-31: 27 [IU] via SUBCUTANEOUS
  Filled 2020-07-31 (×2): qty 0.27

## 2020-07-31 MED ORDER — PROPOFOL 10 MG/ML IV BOLUS
INTRAVENOUS | Status: DC | PRN
  Administered 2020-07-31: 30 mg via INTRAVENOUS

## 2020-07-31 MED ORDER — BUPIVACAINE HCL (PF) 0.5 % IJ SOLN
INTRAMUSCULAR | Status: AC
  Filled 2020-07-31: qty 30

## 2020-07-31 MED ORDER — LIDOCAINE HCL (PF) 1 % IJ SOLN
INTRAMUSCULAR | Status: AC
  Filled 2020-07-31: qty 30

## 2020-07-31 MED ORDER — LIDOCAINE HCL 1 % IJ SOLN
INTRAMUSCULAR | Status: DC | PRN
  Administered 2020-07-31: 10 mL via INTRAMUSCULAR

## 2020-07-31 MED ORDER — KETAMINE HCL 50 MG/5ML IJ SOSY
PREFILLED_SYRINGE | INTRAMUSCULAR | Status: AC
  Filled 2020-07-31: qty 5

## 2020-07-31 SURGICAL SUPPLY — 49 items
AGENT HMST PWDR HDRLZ BVN CLGN (Miscellaneous) ×1 IMPLANT
APL SKNCLS STERI-STRIP NONHPOA (GAUZE/BANDAGES/DRESSINGS)
BANDAGE ACE 4X5 VEL STRL LF (GAUZE/BANDAGES/DRESSINGS) ×2 IMPLANT
BANDAGE ESMARK 4X12 BL STRL LF (DISPOSABLE) IMPLANT
BANDAGE GAUZE ELAST BULKY 4 IN (GAUZE/BANDAGES/DRESSINGS) ×2 IMPLANT
BENZOIN TINCTURE PRP APPL 2/3 (GAUZE/BANDAGES/DRESSINGS) IMPLANT
BLADE AVERAGE 25X9 (BLADE) ×2 IMPLANT
BLADE SURG 15 STRL LF DISP TIS (BLADE) ×1 IMPLANT
BLADE SURG 15 STRL SS (BLADE) ×2
BNDG CMPR 12X4 ELC STRL LF (DISPOSABLE)
BNDG CMPR STD VLCR NS LF 5.8X4 (GAUZE/BANDAGES/DRESSINGS) ×1
BNDG CONFORM 2 STRL LF (GAUZE/BANDAGES/DRESSINGS) ×2 IMPLANT
BNDG ELASTIC 4X5.8 VLCR NS LF (GAUZE/BANDAGES/DRESSINGS) ×2 IMPLANT
BNDG ESMARK 4X12 BLUE STRL LF (DISPOSABLE)
BNDG GAUZE ELAST 4 BULKY (GAUZE/BANDAGES/DRESSINGS) ×2 IMPLANT
CLOTH BEACON ORANGE TIMEOUT ST (SAFETY) ×2 IMPLANT
COLLAGEN CELLERATERX 1 GRAM (Miscellaneous) ×2 IMPLANT
COVER LIGHT HANDLE STERIS (MISCELLANEOUS) ×2 IMPLANT
COVER WAND RF STERILE (DRAPES) ×2 IMPLANT
CUFF TOURN SGL QUICK 18X4 (TOURNIQUET CUFF) ×4 IMPLANT
DECANTER SPIKE VIAL GLASS SM (MISCELLANEOUS) ×4 IMPLANT
DRSG ADAPTIC 3X8 NADH LF (GAUZE/BANDAGES/DRESSINGS) ×2 IMPLANT
ELECT REM PT RETURN 9FT ADLT (ELECTROSURGICAL) ×2
ELECTRODE REM PT RTRN 9FT ADLT (ELECTROSURGICAL) ×1 IMPLANT
GAUZE SPONGE 4X4 12PLY STRL (GAUZE/BANDAGES/DRESSINGS) ×2 IMPLANT
GLOVE SURG ENC MOIS LTX SZ7.5 (GLOVE) IMPLANT
GLOVE SURG LTX SZ7 (GLOVE) ×4 IMPLANT
GLOVE SURG UNDER POLY LF SZ7 (GLOVE) ×4 IMPLANT
GLOVE SURG UNDER POLY LF SZ7.5 (GLOVE) ×2 IMPLANT
GOWN STRL REUS W/ TWL XL LVL3 (GOWN DISPOSABLE) ×1 IMPLANT
GOWN STRL REUS W/TWL LRG LVL3 (GOWN DISPOSABLE) ×2 IMPLANT
GOWN STRL REUS W/TWL XL LVL3 (GOWN DISPOSABLE) ×2
HANDPIECE INTERPULSE COAX TIP (DISPOSABLE) ×2
KIT TURNOVER KIT A (KITS) ×2 IMPLANT
MANIFOLD NEPTUNE II (INSTRUMENTS) ×2 IMPLANT
NEEDLE HYPO 25X1 1.5 SAFETY (NEEDLE) ×4 IMPLANT
NEEDLE HYPO 27GX1-1/4 (NEEDLE) ×4 IMPLANT
NS IRRIG 1000ML POUR BTL (IV SOLUTION) ×2 IMPLANT
PACK BASIC LIMB (CUSTOM PROCEDURE TRAY) ×2 IMPLANT
PAD ARMBOARD 7.5X6 YLW CONV (MISCELLANEOUS) ×2 IMPLANT
RASP SM TEAR CROSS CUT (RASP) IMPLANT
SET BASIN LINEN APH (SET/KITS/TRAYS/PACK) ×2 IMPLANT
SET HNDPC FAN SPRY TIP SCT (DISPOSABLE) ×1 IMPLANT
STRIP CLOSURE SKIN 1/2X4 (GAUZE/BANDAGES/DRESSINGS) IMPLANT
SUT ETHILON 3 0 FSL (SUTURE) ×4 IMPLANT
SUT PROLENE 3 0 PS 1 (SUTURE) IMPLANT
SUT VIC AB 3-0 SH 27 (SUTURE)
SUT VIC AB 3-0 SH 27X BRD (SUTURE) IMPLANT
SYR CONTROL 10ML LL (SYRINGE) ×4 IMPLANT

## 2020-07-31 NOTE — TOC Initial Note (Signed)
Transition of Care [facility]) - Initial/Assessment Note    Patient Details  Name: Christina Lamb MRN: [id number] Date of Birth: 07-15-1960  Transition [facility] CM/SW Contact:    Elliot Gault, LCSW Phone Number: 07/31/2020, 12:34 PM  Clinical Narrative:                  Pt admitted from home. Per MD, pt will need 3-4 weeks of IV antibiotics at dc. Pt is in surgery today for toe amputation. Referral given to [facility] with [facility] to start working on dc arrangements.  Will follow and continue to assist.  Expected Discharge Plan: Home w Home Health Services Barriers to Discharge: Continued Medical Work up   Patient Goals and CMS Choice   CMS Medicare.gov Compare Post Acute Care list provided to:: Patient    Expected Discharge Plan and Services Expected Discharge Plan: Home w Home Health Services In-house Referral: Clinical Social Work   Post Acute Care Choice: Home Health                             HH Arranged: IV Antibiotics HH Agency: [facility] Date HH Agency Contacted: 07/31/20   Representative spoke with at [facility] Agency: Pam  Prior Living Arrangements/Services   Lives with:: Self Patient language and need for interpreter reviewed:: Yes Do you feel safe going back to the place where you live?: Yes      Need for Family Participation in Patient Care: No (Comment) Care giver support system in place?: No (comment)   Criminal Activity/Legal Involvement Pertinent to Current Situation/Hospitalization: No - Comment as needed  Activities of Daily Living Home Assistive Devices/Equipment: Walker (specify type) ADL Screening (condition at time of admission) Patient's cognitive ability adequate to safely complete daily activities?: Yes Is the patient deaf or have difficulty hearing?: No Does the patient have difficulty seeing, even when wearing glasses/contacts?: No Does the patient have difficulty concentrating, remembering, or making  decisions?: No Patient able to express need for assistance with ADLs?: Yes Does the patient have difficulty dressing or bathing?: No Independently performs ADLs?: Yes (appropriate for developmental age) Does the patient have difficulty walking or climbing stairs?: No Weakness of Legs: Both Weakness of Arms/Hands: None  Permission Sought/Granted                  Emotional Assessment       Orientation: : Oriented to Self,Oriented to Place,Oriented to  Time,Oriented to Situation Alcohol / Substance Use: Not Applicable Psych Involvement: No (comment)  Admission diagnosis:  Diabetic foot ulcer (HCC) [W09.811, L97.509] Osteomyelitis of metatarsal (HCC) [M86.9] Patient Active Problem List   Diagnosis Date Noted  . Diabetic foot ulcer (HCC) 07/28/2020  . Acute osteomyelitis of metatarsal bone of right foot (HCC) 07/28/2020  . Diabetes mellitus without complication (HCC)   . Hypertension   . Peripheral arterial disease (HCC) 05/19/2020  . PAD (peripheral artery disease) (HCC) 04/25/2020  . 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 Chronic venous insufficiency 10/08/2017  . Varicose veins of bilateral lower extremities with other complications 10/08/2017   PCP:  Kirstie Peri, MD Pharmacy:   [facility] - [place], [place] - [address] [place] [address] Phone: [phone] Fax: [phone]     Social Determinants of Health (SDOH) Interventions    Readmission Risk Interventions No flowsheet data found.

## 2020-07-31 NOTE — Anesthesia Postprocedure Evaluation (Signed)
Anesthesia Post Note  Patient: Christina Lamb  Procedure(s) Performed: Partial fifth ray amputation right foot. (Right Toe)  Patient location during evaluation: PACU Anesthesia Type: General Level of consciousness: awake Pain management: pain level controlled Vital Signs Assessment: post-procedure vital signs reviewed and stable Respiratory status: spontaneous breathing Cardiovascular status: stable Postop Assessment: no apparent nausea or vomiting Anesthetic complications: no   No complications documented.   Last Vitals:  Vitals:   07/31/20 1130 07/31/20 1148  BP: 122/66   Pulse: 60   Resp: 15   Temp: (!) 36.3 C (!) 36.2 C  SpO2: 98%     Last Pain:  Vitals:   07/31/20 1148  TempSrc: Oral  PainSc:                  Jamell Opfer Hristova

## 2020-07-31 NOTE — Progress Notes (Signed)
Post op Note:   S: Patient is s/p right partial fifth ray amputation. DOS: 07/31/20. Denies any chest pain or calf pain. Resting comfortably. Denies any pain at this moment. She did get PICC line put it in.   O: Dressing on the right foot is clean, dry and intact. Did not remove dressing. Capillary refill time to all the other toes is brisk.   A:s/p right partial fifth ray amputation.  P: Patient examined and evaluated at bedside.  -Instructed patient to keep the dressing clean, dry and intact. Do not change dressing. I will change the surgical dressing at the office on 08/07/20. She will follow up with me at the office.  - Recommend to continue Rocephin for 4 weeks through PICC line. Will need home health for IV infusion services.  - Recommend that she wears the surgical shoe at all the time. - She will need few days of pain medication at the discharge.  - She is stable to discharge from podiatry point of view.  - I will follow up with patient after she gets discharged.

## 2020-07-31 NOTE — Progress Notes (Signed)
Inpatient Diabetes Program Recommendations  AACE/ADA: New Consensus Statement on Inpatient Glycemic Control   Target Ranges:  Prepandial:   less than 140 mg/dL      Peak postprandial:   less than 180 mg/dL (1-2 hours)      Critically ill patients:  140 - 180 mg/dL   Results for Christina Lamb, Christina Lamb (MRN [id number]) as of 07/31/2020 08:04  Ref. Range 07/30/2020 07:48 07/30/2020 11:14 07/30/2020 16:18 07/30/2020 16:47 07/30/2020 20:36 07/31/2020 07:24  Glucose-Capillary Latest Ref Range: 70 - 99 mg/dL 409 (H) 811 (H) 58 (L) 131 (H) 123 (H) 105 (H)   Review of Glycemic Control  Diabetes history: DM2 Outpatient Diabetes medications: Farxiga 10 mg daily, Amaryl 4 mg QAM, Invokamet [phone] mg BID Current orders for Inpatient glycemic control: Levemir 30 units QHS, Novolog 0-15 units TID with meals, Novolog 0-5 units QHS  Inpatient Diabetes Program Recommendations:    Insulin: Please consider decreasing Levemir to 27 units QHS.  Thanks, Orlando Penner, RN, MSN, CDE Diabetes Coordinator Inpatient Diabetes Program [phone] (Team Pager from 8am to 5pm)

## 2020-07-31 NOTE — Progress Notes (Signed)
Peripherally Inserted Central Catheter Placement  The IV Nurse has discussed with the patient and/or persons authorized to consent for the patient, the purpose of this procedure and the potential benefits and risks involved with this procedure.  The benefits include less needle sticks, lab draws from the catheter, and the patient may be discharged home with the catheter. Risks include, but not limited to, infection, bleeding, blood clot (thrombus formation), and puncture of an artery; nerve damage and irregular heartbeat and possibility to perform a PICC exchange if needed/ordered by physician.  Alternatives to this procedure were also discussed.  Bard Power PICC patient education guide, fact sheet on infection prevention and patient information card has been provided to patient /or left at bedside.    PICC Placement Documentation  PICC Single Lumen 07/31/20 PICC Right Basilic 38 cm 0 cm (Active)  Indication for Insertion or Continuance of Line Home intravenous therapies (PICC only) 07/31/20 1750  Exposed Catheter (cm) 0 cm 07/31/20 1750  Site Assessment Clean;Intact;Dry 07/31/20 1750  Line Status Flushed;Blood return noted 07/31/20 1750  Dressing Type Transparent 07/31/20 1750  Dressing Status Clean;Dry;Intact 07/31/20 1750  Antimicrobial disc in place? Yes 07/31/20 1750  Dressing Intervention New dressing;Other (Comment) 07/31/20 1750  Dressing Change Due 08/07/20 07/31/20 1750    Mother signed consent at bedside  Maximino Greenland 07/31/2020, 5:51 PM

## 2020-07-31 NOTE — Transfer of Care (Signed)
Immediate Anesthesia Transfer of Care Note  Patient: Christina Lamb  Procedure(s) Performed: Partial fifth ray amputation right foot. (Right Toe)  Patient Location: PACU  Anesthesia Type:General  Level of Consciousness: awake  Airway & Oxygen Therapy: Patient Spontanous Breathing  Post-op Assessment: Report given to RN and Post -op Vital signs reviewed and stable  Post vital signs: Reviewed and stable  Last Vitals:  Vitals Value Taken Time  BP 117/64 07/31/20 1407  Temp    Pulse 62 07/31/20 1414  Resp 11 07/31/20 1414  SpO2 98 % 07/31/20 1414  Vitals shown include unvalidated device data.  Last Pain:  Vitals:   07/31/20 1148  TempSrc: Oral  PainSc:       Patients Stated Pain Goal: 1 (07/29/20 2151)  Complications: No complications documented.

## 2020-07-31 NOTE — Progress Notes (Signed)
PROGRESS NOTE    Christina Lamb  OFB:[id number] DOB: Oct 09, 1960 DOA: 07/28/2020 PCP: Kirstie Peri, MD   Brief Narrative:   Christina Lamb a 60 y.o.femalewith a history of diabetes type 2 on oral hypoglycemics, hypertension, hyperlipidemia, peripheral artery disease with a recent femoropopliteal bypass with Dr. Chestine Spore on 05/2020, with chronic foot ulcers followed by Dr. Allena Katz. She has had this wound for 3 to 4 months and goes to the wound care clinic. 3 to 4 days ago, the patient was walking and felt that pop on her right foot with onset of pain.Patient was admitted with osteomyelitis of the fifth metatarsal of the right foot in the setting of diabetes with neuropathy. Plan is for operative intervention on 3/28 with antibiotics that have been ordered for ongoing treatment until then.  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 (HCC)   Chronic venous insufficiency   PAD (peripheral artery disease) (HCC)   Diabetic foot ulcer (HCC)   Diabetes mellitus without complication (HCC)   Hypertension   Acute osteomyelitis of metatarsal bone of right foot (HCC)   Acute osteomyelitis of the right fifth toe -With associated cellulitis of right foot in the setting of chronic ulceration -Continue on Rocephin and Flagyl as prescribed -Blood cultures with no noted growth x3 days -Podiatry planning for amputation on 07/31/2020 -Patient will require PICC line after surgery with antibiotics in outpatient setting to 3-4 weeks -MRSA PCR negative  Type 2 diabetes with neuropathy -Hemoglobin A1c 7.1% -Is well controlled on long-acting insulin and SSI  Hypertension -Currently with soft blood pressure readings -Plan to hold lisinopril for now and monitor closely  Peripheral arterial disease/chronic venous insufficiency -ABI pending  DVT prophylaxis:Lovenox Code Status:Full Family Communication:None at bedside, patient  willcall Disposition Plan: Status is: Inpatient  Remains inpatient appropriate because:IV treatments appropriate due to intensity of illness or inability to take PO and Inpatient level of care appropriate due to severity of illness   Dispo: The patient is from:Home Anticipated d/c is PO:EUMP Patient currently is not medically stable to d/c. Difficult to place patient No   Consultants:  Podiatry  Procedures:  See below  Antimicrobials:  Anti-infectives (From admission, onward)   Start     Dose/Rate Route Frequency Ordered Stop   07/28/20 1815  cefTRIAXone (ROCEPHIN) 2 g in sodium chloride 0.9 % 100 mL IVPB        2 g 200 mL/hr over 30 Minutes Intravenous Every 24 hours 07/28/20 1743     07/28/20 1800  metroNIDAZOLE (FLAGYL) IVPB 500 mg        500 mg 100 mL/hr over 60 Minutes Intravenous Every 8 hours 07/28/20 1743         Subjective: Patient seen and evaluated today with mild throbbing to her foot noted.  No other acute concerns noted overnight.  She is awaiting surgery today.  Objective: Vitals:   07/30/20 0510 07/30/20 1252 07/30/20 2039 07/31/20 0505  BP: 106/66 130/72 128/69 113/70  Pulse: 66 65 69 64  Resp: 20 17    Temp: 97.8 F (36.6 C) 97.8 F (36.6 C) 97.6 F (36.4 C) 97.8 F (36.6 C)  TempSrc: Oral Oral Oral Oral  SpO2: 96% 99% 98% 98%  Weight:      Height:        Intake/Output Summary (Last 24 hours) at 07/31/2020 0944 Last data filed at 07/30/2020 2100 Gross per 24 hour  Intake 720 ml  Output 1678 ml  Net -958 ml   Filed Weights   07/28/20 1140 07/28/20 1955  Weight: 90.7 kg 92.6 kg    Examination:  General exam: Appears calm and comfortable  Respiratory system: Clear to auscultation. Respiratory effort normal. Cardiovascular system: S1 & S2 heard, RRR.  Gastrointestinal system: Abdomen is soft Central nervous system: Alert and awake Extremities: No edema, right foot dressings clean  dry and intact Skin: No significant lesions noted Psychiatry: Flat affect.    Data Reviewed: I have personally reviewed following labs and imaging studies  CBC: Recent Labs  Lab 07/28/20 1300 07/30/20 0519 07/31/20 0516  WBC 15.3* 8.4 8.2  NEUTROABS 13.1*  --   --   HGB 12.8 13.3 12.1  HCT 39.2 41.1 39.0  MCV 90.1 91.3 91.1  PLT 246 253 253   Basic Metabolic Panel: Recent Labs  Lab 07/28/20 1300 07/29/20 0607 07/30/20 0519 07/31/20 0516  NA 137 139 141 139  K 4.2 3.9 4.1 4.2  CL 102 106 107 104  CO2 23 22 24 23   GLUCOSE 152* 76 88 84  BUN 16 15 17 14   CREATININE 0.89 0.72 0.84 0.92  CALCIUM 8.4* 8.2* 8.8* 8.5*  MG  --   --  1.9 1.9   GFR: Estimated Creatinine Clearance: 75.5 mL/min (by C-G formula based on SCr of 0.92 mg/dL). Liver Function Tests: Recent Labs  Lab 07/29/20 0607  AST 12*  ALT 11  ALKPHOS 75  BILITOT 0.3  PROT 6.0*  ALBUMIN 2.8*   No results for input(s): LIPASE, AMYLASE in the last 168 hours. No results for input(s): AMMONIA in the last 168 hours. Coagulation Profile: No results for input(s): INR, PROTIME in the last 168 hours. Cardiac Enzymes: No results for input(s): CKTOTAL, CKMB, CKMBINDEX, TROPONINI in the last 168 hours. BNP (last 3 results) No results for input(s): PROBNP in the last 8760 hours. HbA1C: Recent Labs    07/28/20 1300  HGBA1C 7.1*   CBG: Recent Labs  Lab 07/30/20 1114 07/30/20 1618 07/30/20 1647 07/30/20 2036 07/31/20 0724  GLUCAP 258* 58* 131* 123* 105*   Lipid Profile: No results for input(s): CHOL, HDL, LDLCALC, TRIG, CHOLHDL, LDLDIRECT in the last 72 hours. Thyroid Function Tests: No results for input(s): TSH, T4TOTAL, FREET4, T3FREE, THYROIDAB in the last 72 hours. Anemia Panel: No results for input(s): VITAMINB12, FOLATE, FERRITIN, TIBC, IRON, RETICCTPCT in the last 72 hours. Sepsis Labs: Recent Labs  Lab 07/28/20 1300  LATICACIDVEN 1.3    Recent Results (from the past 240 hour(s))   Aerobic Culture w Gram Stain (superficial specimen)     Status: None   Collection Time: 07/28/20 12:47 PM   Specimen: Ulcer; Wound  Result Value Ref Range Status   Specimen Description   Final    ULCER Performed at [facility], [address] [place], [address] [place]    Special Requests   Final    NONE Performed at [facility], [address] [place], [address] [place]    Gram Stain   Final    RARE WBC PRESENT,BOTH PMN AND MONONUCLEAR RARE GRAM POSITIVE COCCI    Culture   Final    RARE NORMAL SKIN FLORA Performed at [facility], [address] [place], [address] [place]    Report Status 07/30/2020 FINAL  Final  Blood culture (routine x 2)     Status: None (Preliminary result)   Collection Time: 07/28/20  1:00 PM   Specimen: BLOOD  Result Value Ref Range Status   Specimen  Description BLOOD BLOOD LEFT ARM  Final   Special Requests   Final    Blood Culture adequate volume BOTTLES DRAWN AEROBIC AND ANAEROBIC   Culture   Final    NO GROWTH 3 DAYS Performed at [facility], [address] [place], [place] [address]    Report Status PENDING  Incomplete  Blood culture (routine x 2)     Status: None (Preliminary result)   Collection Time: 07/28/20  1:00 PM   Specimen: BLOOD  Result Value Ref Range Status   Specimen Description BLOOD LEFT ANTECUBITAL  Final   Special Requests   Final    Blood Culture adequate volume BOTTLES DRAWN AEROBIC AND ANAEROBIC   Culture   Final    NO GROWTH 3 DAYS Performed at [facility], [address] [place], [place] [address]    Report Status PENDING  Incomplete  Resp Panel by RT-PCR (Flu A&B, Covid) Nasopharyngeal Swab     Status: None   Collection Time: 07/28/20  5:07 PM   Specimen: Nasopharyngeal Swab; Nasopharyngeal(NP) swabs in vial transport medium  Result Value Ref Range Status   SARS Coronavirus 2 by RT PCR NEGATIVE NEGATIVE Final    Comment: (NOTE) SARS-CoV-2 target nucleic acids are NOT DETECTED.  The SARS-CoV-2 RNA is  generally detectable in upper respiratory specimens during the acute phase of infection. The lowest concentration of SARS-CoV-2 viral copies this assay can detect is 138 copies/mL. A negative result does not preclude SARS-Cov-2 infection and should not be used as the sole basis for treatment or other patient management decisions. A negative result may occur with  improper specimen collection/handling, submission of specimen other than nasopharyngeal swab, presence of viral mutation(s) within the areas targeted by this assay, and inadequate number of viral copies(<138 copies/mL). A negative result must be combined with clinical observations, patient history, and epidemiological information. The expected result is Negative.  Fact Sheet for Patients:  BloggerCourse.com  Fact Sheet for Healthcare Providers:  SeriousBroker.it  This test is no t yet approved or cleared by the [place] FDA and  has been authorized for detection and/or diagnosis of SARS-CoV-2 by FDA under an Emergency Use Authorization (EUA). This EUA will remain  in effect (meaning this test can be used) for the duration of the COVID-19 declaration under Section 564(b)(1) of the Act, 21 U.S.C.section 360bbb-3(b)(1), unless the authorization is terminated  or revoked sooner.       Influenza A by PCR NEGATIVE NEGATIVE Final   Influenza B by PCR NEGATIVE NEGATIVE Final    Comment: (NOTE) The Xpert Xpress SARS-CoV-2/FLU/RSV plus assay is intended as an aid in the diagnosis of influenza from Nasopharyngeal swab specimens and should not be used as a sole basis for treatment. Nasal washings and aspirates are unacceptable for Xpert Xpress SARS-CoV-2/FLU/RSV testing.  Fact Sheet for Patients: BloggerCourse.com  Fact Sheet for Healthcare Providers: SeriousBroker.it  This test is not yet approved or cleared by the [place] FDA and has been authorized for detection and/or diagnosis of SARS-CoV-2 by FDA under an Emergency Use Authorization (EUA). This EUA will remain in effect (meaning this test can be used) for the duration of the COVID-19 declaration under Section 564(b)(1) of the Act, 21 U.S.C. section 360bbb-3(b)(1), unless the authorization is terminated or revoked.  Performed at [facility], [address] [place], [place] [address]   MRSA PCR Screening     Status: None   Collection Time: 07/28/20  5:42 PM   Specimen: Nasopharyngeal  Result Value  Ref Range Status   MRSA by PCR NEGATIVE NEGATIVE Final    Comment:        The GeneXpert MRSA Assay (FDA approved for NASAL specimens only), is one component of a comprehensive MRSA colonization surveillance program. It is not intended to diagnose MRSA infection nor to guide or monitor treatment for MRSA infections. Performed at [facility], [address] [place], [place] [address]   Surgical PCR screen     Status: None   Collection Time: 07/30/20  3:30 PM   Specimen: Nasal Mucosa; Nasal Swab  Result Value Ref Range Status   MRSA, PCR NEGATIVE NEGATIVE Final   Staphylococcus aureus NEGATIVE NEGATIVE Final    Comment: (NOTE) The Xpert SA Assay (FDA approved for NASAL specimens in patients 60 years of age and older), is one component of a comprehensive surveillance program. It is not intended to diagnose infection nor to guide or monitor treatment. Performed at [facility], [address] [place], [place] [address]          Radiology Studies: No results found.      Scheduled Meds: . aspirin EC  81 mg Oral Daily  . atorvastatin  80 mg Oral Daily  . citalopram  40 mg Oral Daily  . insulin aspart  0-15 Units Subcutaneous TID WC  . insulin aspart  0-5 Units Subcutaneous QHS  . insulin detemir  27 Units Subcutaneous QHS  . melatonin  6 mg Oral QHS  . multivitamin with minerals  1 tablet Oral Daily  . Ensure Max Protein  11  oz Oral BID   Continuous Infusions: . cefTRIAXone (ROCEPHIN)  IV 2 g (07/30/20 1838)  . metronidazole 500 mg (07/31/20 0837)     LOS: 3 days    Time spent: 35 minutes    Lazlo Tunney D Sherryll BurgerShah, DO [facility]  If 7PM-7AM, please contact night-coverage [email or web address] 07/31/2020, 9:44 AM

## 2020-07-31 NOTE — Progress Notes (Signed)
Pre-Op note:   Pre-Op diagnosis: Acute osteomyelitis of the right fifth toe with chronic ulceration.   Planned procedure: Right fifth met head resection with possibly partial 5th ray amputation.   Surgeon: Tyson Babinski  Physical exam:  Derm: Right foot: There is full thickness ulcer noted on the right sub 5th met head measuring 1.3x0.8x0.5. There is exposed bone noted at the ulcer site with erosive changes noted. There is serosanguinous drainage noted. Increase in warmth noted at the ulcer site with localized redness. No soft tissue crepitus felt. There is also ulcer noted on the left foot sub 2nd met head with granular wound base measuring 0.9x0.5x0.3. No drainage noted. Does not probe to bone. Contracted lesser digits noted on both feet.  Vascular: pedal pulses palpable. Capillary refill is brisk to lesser toes.  Musculoskeletal: Muscle strength is 5/5.  Neuro: Protective sensation is absent at the metatarsal level.  Plan: Patient examined and evaluated.  Procedure was explained in Layman's term.  No guarantees were given for the outcome of the procedure.  She understood the risk and benefits of the procedure including infection, more surgery.  Informed consent was signed and witnessed.

## 2020-07-31 NOTE — Op Note (Signed)
07/31/2020  1:57 PM  PATIENT:  Christina Lamb  60 y.o. female  PRE-OPERATIVE DIAGNOSIS:  Acute Osteomyelitis right fifth toe with chronic ulcer  POST-OPERATIVE DIAGNOSIS:  Acute Osteomyelitis right fifth toe with chronic ulcer  PROCEDURE:  Procedure(s): Partial fifth ray amputation right foot. (Right)  SURGEON:  Surgeon(s) and Role:    * Alexandre Lightsey, Dance movement psychotherapist, DPM - Primary  PHYSICIAN ASSISTANT: None  ANESTHESIA:   local and MAC  EBL:  none   BLOOD ADMINISTERED:none  DRAINS: none   LOCAL MEDICATIONS USED:  MARCAINE , LIDOCAINE  and Amount: 10 ml  SPECIMEN:  Source of Specimen: 1) Right fifth met clean margin, 2) right fifth met head and right fifth toe.   DISPOSITION OF SPECIMEN:  PATHOLOGY  COUNTS:  YES  TOURNIQUET:   Total Tourniquet Time Documented: Calf (Right) - 43 minutes Total: Calf (Right) - 43 minutes  Materials used: Pulsed lavage, 1gm cellurate powder, 3-0 Nylon.   Patient was brought into the operating room laid supine on the operating table. Ankle tourniquet was applied to the surgical extremity. Following IV sedation, a local block was achieved using 10 cc of mixture of 1% plain lidocaine with 0.5% marcaine. The foot was the prepped, scrubbed and draped in aseptic manner. The right leg was elevated for 3 minutes and the tourniquet on the surgical site was inflatted at 238mHG.   Attention was directed towards the right fifth toe. There is full thickness ulcer note with exposed 5th met head with erosive changes noted. A dorsal linear incision was made down to skin and subcutaneous tissue. The capsule was reflected of the 5th met head. The 5th met head was resected using sagittal saw. The bone was soft. Erosive changes noted at the base of the proximal phalanx. Some purulent drainage noted. The decision was made to remove the right fifth toe as well. The incision was extended more distally. The toe was disarticulated at the MPJ and removed. The skin which consisted  the ulcer was excised using #15 blade. At this time using sagittal saw, a clean margin was resected at the 5th metatarsal shaft. The bone was healthy, strong and viable. No sharp edge or spicule noted.  At this time deep wound culture and bone cultures were obtained. Pulse lavage was used to irrigate the wound. All non viable tissue and excess skin was removed. Cellurate collagen powder 1 gram was sprinkled into the wound to help assist wound healing. The skin was closed using 3-0 Nylon. Tourniquet was deflated. Capillary refill time was brisk to all lesser toes. Dry sterile dressing applied.  Patient will get PICC line for 3-4 weeks of IV antibiotics. Patient was transferred to PACU with VSS.

## 2020-07-31 NOTE — Brief Op Note (Signed)
07/31/2020  1:57 PM  PATIENT:  Christina Lamb  60 y.o. female  PRE-OPERATIVE DIAGNOSIS:  Acute Osteomyelitis right fifth toe with chronic ulcer  POST-OPERATIVE DIAGNOSIS:  Acute Osteomyelitis right fifth toe with chronic ulcer  PROCEDURE:  Procedure(s): Partial fifth ray amputation right foot. (Right)  SURGEON:  Surgeon(s) and Role:    * Betty Daidone, Dance movement psychotherapist, DPM - Primary  PHYSICIAN ASSISTANT:   ASSISTANTS: none   ANESTHESIA:   local and MAC  EBL:  none   BLOOD ADMINISTERED:none  DRAINS: none   LOCAL MEDICATIONS USED:  MARCAINE   , LIDOCAINE  and Amount: 10 ml  SPECIMEN:  Source of Specimen:  Right fifth met head  DISPOSITION OF SPECIMEN:  PATHOLOGY  COUNTS:  YES  TOURNIQUET:   Total Tourniquet Time Documented: Calf (Right) - 43 minutes Total: Calf (Right) - 43 minutes   DICTATION: .Viviann Spare Dictation  PLAN OF CARE: Admit to inpatient   PATIENT DISPOSITION:  PACU - hemodynamically stable.   Delay start of Pharmacological VTE agent (>24hrs) due to surgical blood loss or risk of bleeding: not applicable

## 2020-07-31 NOTE — Anesthesia Preprocedure Evaluation (Addendum)
Anesthesia Evaluation  Patient identified by MRN, date of birth, ID band Patient awake    Reviewed: Allergy & Precautions, NPO status , Patient's Chart, lab work & pertinent test results  Airway Mallampati: II  TM Distance: >3 FB Neck ROM: Full    Dental  (+) Dental Advisory Given, Teeth Intact   Pulmonary neg COPD,  COPD inhaler, former smoker,    Pulmonary exam normal breath sounds clear to auscultation       Cardiovascular Exercise Tolerance: Good hypertension, Pt. on medications + Peripheral Vascular Disease  Normal cardiovascular exam Rhythm:Regular Rate:Normal     Neuro/Psych negative neurological ROS  negative psych ROS   GI/Hepatic negative GI ROS, Neg liver ROS,   Endo/Other  diabetes, Well Controlled, Type 2, Oral Hypoglycemic Agents  Renal/GU      Musculoskeletal negative musculoskeletal ROS (+) Right foot ulcer   Abdominal   Peds  Hematology negative hematology ROS (+)   Anesthesia Other Findings   Reproductive/Obstetrics negative OB ROS                          Anesthesia Physical Anesthesia Plan  ASA: III  Anesthesia Plan: General   Post-op Pain Management:    Induction: Intravenous  PONV Risk Score and Plan: Ondansetron and Propofol infusion  Airway Management Planned: Nasal Cannula, Natural Airway and Simple Face Mask  Additional Equipment:   Intra-op Plan:   Post-operative Plan:   Informed Consent:     Dental advisory given  Plan Discussed with: CRNA and Surgeon  Anesthesia Plan Comments: (GA with propofol vs GA with airway was discussed)       Anesthesia Quick Evaluation

## 2020-08-01 ENCOUNTER — Encounter (HOSPITAL_COMMUNITY): Payer: Self-pay | Admitting: Podiatry

## 2020-08-01 DIAGNOSIS — M86171 Other acute osteomyelitis, right ankle and foot: Secondary | ICD-10-CM | POA: Diagnosis not present

## 2020-08-01 LAB — BASIC METABOLIC PANEL
Anion gap: 7 (ref 5–15)
BUN: 18 mg/dL (ref 6–20)
CO2: 24 mmol/L (ref 22–32)
Calcium: 8 mg/dL — ABNORMAL LOW (ref 8.9–10.3)
Chloride: 104 mmol/L (ref 98–111)
Creatinine, Ser: 0.89 mg/dL (ref 0.44–1.00)
GFR, Estimated: >60 mL/min (ref >60)
Glucose, Bld: 129 mg/dL — ABNORMAL HIGH (ref 70–99)
Potassium: 4.2 mmol/L (ref 3.5–5.1)
Sodium: 135 mmol/L (ref 135–145)

## 2020-08-01 LAB — GLUCOSE, CAPILLARY
Glucose-Capillary: 147 mg/dL — ABNORMAL HIGH (ref 70–99)
Glucose-Capillary: 173 mg/dL — ABNORMAL HIGH (ref 70–99)

## 2020-08-01 LAB — CBC
HCT: 36.1 % (ref 36.0–46.0)
Hemoglobin: 11.6 g/dL — ABNORMAL LOW (ref 12.0–15.0)
MCH: 29.4 pg (ref 26.0–34.0)
MCHC: 32.1 g/dL (ref 30.0–36.0)
MCV: 91.4 fL (ref 80.0–100.0)
Platelets: 222 10*3/uL (ref 150–400)
RBC: 3.95 MIL/uL (ref 3.87–5.11)
RDW: 14.1 % (ref 11.5–15.5)
WBC: 8 10*3/uL (ref 4.0–10.5)
nRBC: 0 % (ref 0.0–0.2)

## 2020-08-01 LAB — MAGNESIUM: Magnesium: 1.9 mg/dL (ref 1.7–2.4)

## 2020-08-01 MED ORDER — KETOROLAC TROMETHAMINE 10 MG PO TABS: 10.0000 mg | ORAL_TABLET | Freq: Four times a day (QID) | ORAL | 0 refills | Status: DC | PRN

## 2020-08-01 MED ORDER — SODIUM CHLORIDE 0.9 % IV SOLN
2.0000 g | INTRAVENOUS | Status: DC
  Administered 2020-08-01: 2 g via INTRAVENOUS
  Filled 2020-08-01: qty 20

## 2020-08-01 MED ORDER — CEFTRIAXONE IV (FOR PTA / DISCHARGE USE ONLY): 2.0000 g | INTRAVENOUS | 0 refills | Status: DC

## 2020-08-01 NOTE — Progress Notes (Signed)
PHARMACY CONSULT NOTE FOR:  OUTPATIENT  PARENTERAL ANTIBIOTIC THERAPY (OPAT)  Indication: osteomyelitis Regimen: Ceftriaxone 2gm iv q24h  End date: 08/25/20  IV antibiotic discharge orders are pended. To discharging provider:  please sign these orders via discharge navigator,  Select New Orders & click on the button choice - Manage This Unsigned Work.     Thank you for allowing pharmacy to be a part of this patient's care.  Christina Lamb 08/01/2020, 8:58 AM

## 2020-08-01 NOTE — Discharge Summary (Signed)
Physician Discharge Summary  DRIANNA CHANDRAN OIN:[id number] DOB: 1960-11-07 DOA: 07/28/2020  PCP: Monico Blitz, MD  Admit date: 07/28/2020  Discharge date: 08/01/2020  Admitted From:Home  Disposition:  Home  Recommendations for Outpatient Follow-up:  1. Follow up with PCP in 1-2 weeks 2. Follow-up with podiatry as scheduled on 4/4 for dressing changes 3. Continue on Rocephin IV at home as prescribed through PICC line through 4/22 4. Pain medication with Toradol provided as needed 5. Continue home medications as prior  Home Health: Yes with home health RN  Equipment/Devices: None  Discharge Condition:Stable  CODE STATUS: Full  Diet recommendation: Heart Healthy/carb modified  Brief/Interim Summary:  Maudie Flakes a 60 y.o.femalewith a history of diabetes type 2 on oral hypoglycemics, hypertension, hyperlipidemia, peripheral artery disease with a recent femoropopliteal bypass with Dr. Carlis Abbott on 05/2020, with chronic foot ulcers followed by Dr. Posey Pronto. She has had this wound for 3 to 4 months and goes to the wound care clinic. 3 to 4 days ago, the patient was walking and felt that pop on her right foot with onset of pain.Patient was admitted with osteomyelitis of the fifth metatarsal of the right foot in the setting of diabetes with neuropathy.  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  She is in stable condition for discharge today and has had PICC line also placed on 3/28 in anticipation for 4 weeks of Rocephin at home.  This has been arranged.  She has had no other acute events throughout the course of this admission and states that her pain is well controlled on Toradol.  Discharge Diagnoses:  Active Problems:   Atherosclerosis of artery of extremity with intermittent claudication (HCC)   Chronic venous insufficiency   PAD (peripheral artery disease) (HCC)   Diabetic foot ulcer (HCC)   Diabetes mellitus without  complication ([place])   Hypertension   Acute osteomyelitis of metatarsal bone of right foot ([place])  Principal discharge diagnosis: Acute osteomyelitis of right fifth toe in the setting of chronic ulcer status post partial fifth ray amputation on 3/28.  Discharge Instructions  Discharge Instructions    [facility] pharmacist to adjust dose for Vancomycin, Aminoglycosides and other anti-infective therapies as requested by physician.   Complete by: As directed    [facility] to provide Cath Flo 64m   Complete by: As directed    Administer for PICC line occlusion and as ordered by physician for other access device issues.   Anaphylaxis Kit: Provided to treat any anaphylactic reaction to the medication being provided to the patient if First Dose or when requested by physician   Complete by: As directed    Epinephrine 174mml vial / amp: Administer 0.62m18m0.62ml362mubcutaneously once for moderate to severe anaphylaxis, nurse to call physician and pharmacy when reaction occurs and call 911 if needed for immediate care   Diphenhydramine 50mg50mIV vial: Administer 25-50mg 16mM PRN for first dose reaction, rash, itching, mild reaction, nurse to call physician and pharmacy when reaction occurs   Sodium Chloride 0.9% NS 500ml I51mdminister if needed for hypovolemic blood pressure drop or as ordered by physician after call to physician with anaphylactic reaction   Change dressing on IV access line weekly and PRN   Complete by: As directed    Diet - low sodium heart healthy   Complete by: As directed    Flush IV access with Sodium Chloride 0.9% and Heparin 10 units/ml or  100 units/ml   Complete by: As directed    Home infusion instructions - [facility]   Complete by: As directed    Instructions: Flush IV access with Sodium Chloride 0.9% and Heparin 10units/ml or 100units/ml   Change dressing on IV access line: Weekly and PRN   Instructions Cath Flo 48m: Administer for  PICC Line occlusion and as ordered by physician for other access device   [facility] pharmacist to adjust dose for: Vancomycin, Aminoglycosides and other anti-infective therapies as requested by physician   Increase activity slowly   Complete by: As directed    Method of administration may be changed at the discretion of home infusion pharmacist based upon assessment of the patient and/or caregiver's ability to self-administer the medication ordered   Complete by: As directed    No wound care   Complete by: As directed      Allergies as of 08/01/2020      Reactions   Hydromet [hydrocodone-homatropine] Nausea And Vomiting   Penicillins Swelling   Codeine Nausea And Vomiting      Medication List    STOP taking these medications   doxycycline 100 MG tablet Commonly known as: VIBRA-TABS     TAKE these medications   albuterol 108 (90 Base) MCG/ACT inhaler Commonly known as: VENTOLIN HFA Inhale 2 puffs into the lungs every 6 (six) hours as needed for wheezing or shortness of breath.   aspirin EC 81 MG tablet Take 81 mg by mouth daily.   atorvastatin 80 MG tablet Commonly known as: LIPITOR Take 1 tablet (80 mg total) by mouth daily.   cefTRIAXone  IVPB Commonly known as: ROCEPHIN Inject 2 g into the vein daily. Indication:  osteomyelitis First Dose: No Last Day of Therapy:  08/25/20 Labs - Once weekly:  CBC/D and BMP, Labs - Every other week:  ESR and CRP Method of administration: IV Push Method of administration may be changed at the discretion of home infusion pharmacist based upon assessment of the patient and/or caregiver's ability to self-administer the medication ordered.   citalopram 40 MG tablet Commonly known as: CELEXA Take 40 mg by mouth daily.   dapagliflozin propanediol 10 MG Tabs tablet Commonly known as: FARXIGA Take 10 mg by mouth daily.   glimepiride 4 MG tablet Commonly known as: AMARYL Take 4 mg by mouth daily with breakfast.   Invokamet  [phone] MG Tabs Generic drug: Canagliflozin-metFORMIN HCl Take 1 tablet by mouth 2 (two) times daily.   ketorolac 10 MG tablet Commonly known as: TORADOL Take 1 tablet (10 mg total) by mouth every 6 (six) hours as needed for moderate pain or severe pain.   lisinopril 20 MG tablet Commonly known as: ZESTRIL Take 20 mg by mouth in the morning and at bedtime.   omega-3 acid ethyl esters 1 g capsule Commonly known as: LOVAZA Take 1 g by mouth 2 (two) times daily.            Discharge Care Instructions  (From admission, onward)         Start     Ordered   08/01/20 0000  Change dressing on IV access line weekly and PRN  (Home infusion instructions - [facility] )        08/01/20 1028          Follow-up Information    SMonico Blitz MD. Schedule an appointment as soon as possible for a visit in 2 week(s).   Specialty: Internal Medicine Contact information:  [address] [place] [address] [phone]        Tyson Babinski, DPM. Go on 08/07/2020.   Specialty: Podiatry             Allergies  Allergen Reactions  . Hydromet [Hydrocodone-Homatropine] Nausea And Vomiting  . Penicillins Swelling  . Codeine Nausea And Vomiting    Consultations: Podiatry, Dr. Posey Pronto  Procedures/Studies: MR FOOT RIGHT W WO CONTRAST  Result Date: 07/29/2020 CLINICAL DATA:  Right forefoot osteomyelitis EXAM: MRI OF THE RIGHT FOREFOOT WITHOUT AND WITH CONTRAST TECHNIQUE: Multiplanar, multisequence MR imaging of the right forefoot was performed before and after the administration of intravenous contrast. CONTRAST:  91m GADAVIST GADOBUTROL 1 MMOL/ML IV SOLN COMPARISON:  X-ray 09/27/2020, MRI 05/08/2020 FINDINGS: Technical note: Motion degraded exam. Patient unable to tolerate repeat sequences. Best possible images were obtained and submitted for interpretation. Bones/Joint/Cartilage Progressive destruction of the fifth metatarsal head with confluent low T1 signal changes  compatible with acute osteomyelitis. Marrow edema and enhancement extends approximately 3.0 cm proximally to the level of the distal diaphysis. This has progressed from the previous MRI. New acute osteomyelitis involving the base of the fifth toe proximal phalanx (series 3, image 12). Fifth MTP joint remains dorsally dislocated (series 7, image 3). Bone marrow edema within the second metatarsal head (series 4, image 12) with enhancement and patchy low T1 marrow signal is concerning for osteomyelitis. There is marrow edema within the adjacent second toe proximal phalanx base without associated T1 marrow signal abnormality, this may reflect reactive osteitis. There is also mild marrow edema within the third and fourth metatarsal heads with preservation of the T1 marrow signal, likely reactive. No acute fracture.  Flexion deformities of the lesser toes. Ligaments Intact Lisfranc ligament. Capsular disruption of the fifth MTP joint. Remaining collateral ligaments intact. Muscles and Tendons Fatty infiltration of the intrinsic foot musculature. No tenosynovial fluid collection. Soft tissues Soft tissue ulceration along the plantar-lateral aspect of the forefoot at the level of the fifth MTP joint. Fifth metatarsal head is likely exposed at the ulcer base. There is surrounding soft tissue edema and enhancement with rim enhancing fluid collection extending proximally to the level of the fifth metatarsal neck measuring approximately 1.8 x 0.5 x 1.5 cm (series 9, image 23). Diffuse dorsal soft tissue edema. IMPRESSION: 1. Soft tissue ulceration along the plantar-lateral aspect of the forefoot at the level of the fifth MTP joint. Fifth metatarsal head is likely exposed at the ulcer base. Small abscess extending proximally to the level of the fifth metatarsal neck. 2. Progressive acute osteomyelitis of the fifth metatarsal head and neck as well as the base of the fifth toe proximal phalanx. 3. Marrow edema within the second  metatarsal head with patchy low T1 marrow signal, concerning for osteomyelitis. Mild marrow edema within the adjacent second toe proximal phalanx base without associated T1 marrow signal abnormality, this may reflect reactive osteitis. 4. Mild marrow edema within the third and fourth metatarsal heads, likely reactive. Electronically Signed   By: NDavina PokeD.O.   On: 07/29/2020 11:25   DG Foot Complete Right  Result Date: 07/31/2020 CLINICAL DATA:  Amputation. EXAM: RIGHT FOOT COMPLETE - 3+ VIEW COMPARISON:  MRI right foot pain and right foot x-rays dated July 28, 2020. FINDINGS: Interval fifth ray amputation with expected postsurgical changes and small amount of subcutaneous emphysema. No acute fracture or dislocation. Scattered degenerative changes. IMPRESSION: 1. Interval fifth ray amputation. Electronically Signed   By: WTitus DubinM.D.   On:  07/31/2020 14:41   DG Foot Complete Right  Result Date: 07/28/2020 CLINICAL DATA:  Right foot ulceration. History of osteomyelitis of the fifth metatarsal head EXAM: RIGHT FOOT COMPLETE - 3+ VIEW COMPARISON:  MRI 05/08/2020 FINDINGS: Progressive bony erosion of the fifth metatarsal head and neck compatible with acute osteomyelitis. Suspect subtle erosion of the base of the fifth toe proximal phalanx. There is dorsal dislocation of the fifth MTP joint. Adjacent soft tissue ulceration with the lateral aspect of the fifth metatarsal head closely approximating the ulcer base, and may be exposed. Osteopenia of the fourth metatarsal head without definite erosion. Scattered degenerative changes. Prominent plantar calcaneal spur. Vascular calcifications. IMPRESSION: 1. Progressive bony erosion of the fifth metatarsal head and neck compatible with acute osteomyelitis. Suspect osteomyelitis of the base of the fifth toe proximal phalanx. 2. Dorsal dislocation of the fifth MTP joint. 3. Adjacent soft tissue ulceration with the lateral aspect of the fifth metatarsal  head closely approximating the ulcer base, and may be exposed. Electronically Signed   By: Davina Poke D.O.   On: 07/28/2020 13:48   [place] EKG SITE RITE  Result Date: 07/31/2020 If Site Rite image not attached, placement could not be confirmed due to current cardiac rhythm.     Discharge Exam: Vitals:   07/31/20 2053 08/01/20 0438  BP: 122/62 113/66  Pulse: 62 (!) 57  Resp: 18 14  Temp: 98 F (36.7 C) 97.8 F (36.6 C)  SpO2: 98% 99%   Vitals:   07/31/20 1500 07/31/20 1612 07/31/20 2053 08/01/20 0438  BP: 124/67 120/62 122/62 113/66  Pulse: 64 65 62 (!) 57  Resp: '18 16 18 14  ' Temp: 98.6 F (37 C) 97.8 F (36.6 C) 98 F (36.7 C) 97.8 F (36.6 C)  TempSrc: Oral Oral  Oral  SpO2: 99% 99% 98% 99%  Weight:      Height:        General: Pt is alert, awake, not in acute distress Cardiovascular: RRR, S1/S2 +, no rubs, no gallops Respiratory: CTA bilaterally, no wheezing, no rhonchi Abdominal: Soft, NT, ND, bowel sounds + Extremities: no edema, no cyanosis, right foot dressings clean dry and intact    The results of significant diagnostics from this hospitalization (including imaging, microbiology, ancillary and laboratory) are listed below for reference.     Microbiology: Recent Results (from the past 240 hour(s))  Aerobic Culture w Gram Stain (superficial specimen)     Status: None   Collection Time: 07/28/20 12:47 PM   Specimen: Ulcer; Wound  Result Value Ref Range Status   Specimen Description   Final    ULCER Performed at [facility], [address] [place], [place] [address]    Special Requests   Final    NONE Performed at [facility], [address], [place], [place] [address]    Gram Stain   Final    RARE WBC PRESENT,BOTH PMN AND MONONUCLEAR RARE GRAM POSITIVE COCCI    Culture   Final    RARE NORMAL SKIN FLORA Performed at [facility], [place] [address], [place], [place] [address]    Report Status 07/30/2020 FINAL  Final  Blood culture  (routine x 2)     Status: None (Preliminary result)   Collection Time: 07/28/20  1:00 PM   Specimen: BLOOD  Result Value Ref Range Status   Specimen Description BLOOD BLOOD LEFT ARM  Final   Special Requests   Final    Blood Culture adequate volume BOTTLES DRAWN AEROBIC  AND ANAEROBIC   Culture   Final    NO GROWTH 4 DAYS Performed at [facility], [address] [place], [place] [address]    Report Status PENDING  Incomplete  Blood culture (routine x 2)     Status: None (Preliminary result)   Collection Time: 07/28/20  1:00 PM   Specimen: BLOOD  Result Value Ref Range Status   Specimen Description BLOOD LEFT ANTECUBITAL  Final   Special Requests   Final    Blood Culture adequate volume BOTTLES DRAWN AEROBIC AND ANAEROBIC   Culture   Final    NO GROWTH 4 DAYS Performed at [facility], [address] [place], [place] [address]    Report Status PENDING  Incomplete  Resp Panel by RT-PCR (Flu A&B, Covid) Nasopharyngeal Swab     Status: None   Collection Time: 07/28/20  5:07 PM   Specimen: Nasopharyngeal Swab; Nasopharyngeal(NP) swabs in vial transport medium  Result Value Ref Range Status   SARS Coronavirus 2 by RT PCR NEGATIVE NEGATIVE Final    Comment: (NOTE) SARS-CoV-2 target nucleic acids are NOT DETECTED.  The SARS-CoV-2 RNA is generally detectable in upper respiratory specimens during the acute phase of infection. The lowest concentration of SARS-CoV-2 viral copies this assay can detect is 138 copies/mL. A negative result does not preclude SARS-Cov-2 infection and should not be used as the sole basis for treatment or other patient management decisions. A negative result may occur with  improper specimen collection/handling, submission of specimen other than nasopharyngeal swab, presence of viral mutation(s) within the areas targeted by this assay, and inadequate number of viral copies(<138 copies/mL). A negative result must be combined with clinical observations, patient  history, and epidemiological information. The expected result is Negative.  Fact Sheet for Patients:  EntrepreneurPulse.com.au  Fact Sheet for Healthcare Providers:  IncredibleEmployment.be  This test is no t yet approved or cleared by the [place] FDA and  has been authorized for detection and/or diagnosis of SARS-CoV-2 by FDA under an Emergency Use Authorization (EUA). This EUA will remain  in effect (meaning this test can be used) for the duration of the COVID-19 declaration under Section 564(b)(1) of the Act, 21 U.S.C.section 360bbb-3(b)(1), unless the authorization is terminated  or revoked sooner.       Influenza A by PCR NEGATIVE NEGATIVE Final   Influenza B by PCR NEGATIVE NEGATIVE Final    Comment: (NOTE) The Xpert Xpress SARS-CoV-2/FLU/RSV plus assay is intended as an aid in the diagnosis of influenza from Nasopharyngeal swab specimens and should not be used as a sole basis for treatment. Nasal washings and aspirates are unacceptable for Xpert Xpress SARS-CoV-2/FLU/RSV testing.  Fact Sheet for Patients: EntrepreneurPulse.com.au  Fact Sheet for Healthcare Providers: IncredibleEmployment.be  This test is not yet approved or cleared by the [place] FDA and has been authorized for detection and/or diagnosis of SARS-CoV-2 by FDA under an Emergency Use Authorization (EUA). This EUA will remain in effect (meaning this test can be used) for the duration of the COVID-19 declaration under Section 564(b)(1) of the Act, 21 U.S.C. section 360bbb-3(b)(1), unless the authorization is terminated or revoked.  Performed at [facility], [address], [place], [place] [address]   MRSA PCR Screening     Status: None   Collection Time: 07/28/20  5:42 PM   Specimen: Nasopharyngeal  Result Value Ref Range Status   MRSA by PCR NEGATIVE NEGATIVE Final    Comment:        The GeneXpert MRSA  Assay  (FDA approved for NASAL specimens only), is one component of a comprehensive MRSA colonization surveillance program. It is not intended to diagnose MRSA infection nor to guide or monitor treatment for MRSA infections. Performed at [facility], [address] [place], [place] [address]   Surgical PCR screen     Status: None   Collection Time: 07/30/20  3:30 PM   Specimen: Nasal Mucosa; Nasal Swab  Result Value Ref Range Status   MRSA, PCR NEGATIVE NEGATIVE Final   Staphylococcus aureus NEGATIVE NEGATIVE Final    Comment: (NOTE) The Xpert SA Assay (FDA approved for NASAL specimens in patients 26 years of age and older), is one component of a comprehensive surveillance program. It is not intended to diagnose infection nor to guide or monitor treatment. Performed at [facility], [address], [place], [place] [address]   Anaerobic culture w Gram Stain     Status: None (Preliminary result)   Collection Time: 07/31/20  1:30 PM   Specimen: PATH Digit amputation; Tissue  Result Value Ref Range Status   Specimen Description TISSUE BONE TOE  Final   Special Requests 5TH TOE  Final   Gram Stain   Final    RARE WBC PRESENT,BOTH PMN AND MONONUCLEAR NO ORGANISMS SEEN Performed at [facility], [address] [place], [place] [address]    Culture PENDING  Incomplete   Report Status PENDING  Incomplete  Aerobic/Anaerobic Culture w Gram Stain (surgical/deep wound)     Status: None (Preliminary result)   Collection Time: 07/31/20  1:36 PM   Specimen: PATH Cytology Misc. fluid; Body Fluid  Result Value Ref Range Status   Specimen Description   Final    TOE RIGHT FIFTH Performed at [facility], [address], [place], [place] [address]    Special Requests   Final    NONE Performed at [facility], [address] [place], [place] [address]    Gram Stain   Final    RARE WBC PRESENT,BOTH PMN AND MONONUCLEAR RARE GRAM POSITIVE COCCI IN PAIRS Performed at [facility], [place] [address], [place],  [address]    Culture PENDING  Incomplete   Report Status PENDING  Incomplete     Labs: BNP (last 3 results) No results for input(s): BNP in the last 8760 hours. Basic Metabolic Panel: Recent Labs  Lab 07/28/20 1300 07/29/20 0607 07/30/20 0519 07/31/20 0516 08/01/20 0546  NA 137 139 141 139 135  K 4.2 3.9 4.1 4.2 4.2  CL 102 106 107 104 104  CO2 '23 22 24 23 24  ' GLUCOSE 152* 76 88 84 129*  BUN '16 15 17 14 18  ' CREATININE 0.89 0.72 0.84 0.92 0.89  CALCIUM 8.4* 8.2* 8.8* 8.5* 8.0*  MG  --   --  1.9 1.9 1.9   Liver Function Tests: Recent Labs  Lab 07/29/20 0607  AST 12*  ALT 11  ALKPHOS 75  BILITOT 0.3  PROT 6.0*  ALBUMIN 2.8*   No results for input(s): LIPASE, AMYLASE in the last 168 hours. No results for input(s): AMMONIA in the last 168 hours. CBC: Recent Labs  Lab 07/28/20 1300 07/30/20 0519 07/31/20 0516 08/01/20 0546  WBC 15.3* 8.4 8.2 8.0  NEUTROABS 13.1*  --   --   --   HGB 12.8 13.3 12.1 11.6*  HCT 39.2 41.1 39.0 36.1  MCV 90.1 91.3 91.1 91.4  PLT 246 253 253 222   Cardiac Enzymes: No results  for input(s): CKTOTAL, CKMB, CKMBINDEX, TROPONINI in the last 168 hours. BNP: Invalid input(s): POCBNP CBG: Recent Labs  Lab 07/31/20 1422 07/31/20 1518 07/31/20 1800 07/31/20 2123 08/01/20 0729  GLUCAP 77 128* 156* 160* 147*   D-Dimer No results for input(s): DDIMER in the last 72 hours. Hgb A1c No results for input(s): HGBA1C in the last 72 hours. Lipid Profile No results for input(s): CHOL, HDL, LDLCALC, TRIG, CHOLHDL, LDLDIRECT in the last 72 hours. Thyroid function studies No results for input(s): TSH, T4TOTAL, T3FREE, THYROIDAB in the last 72 hours.  Invalid input(s): FREET3 Anemia work up No results for input(s): VITAMINB12, FOLATE, FERRITIN, TIBC, IRON, RETICCTPCT in the last 72 hours. Urinalysis    Component Value Date/Time   COLORURINE YELLOW 05/16/2020 1111   APPEARANCEUR HAZY (A)  05/16/2020 1111   LABSPEC 1.010 05/16/2020 1111   PHURINE 5.0 05/16/2020 1111   GLUCOSEU >=500 (A) 05/16/2020 1111   HGBUR NEGATIVE 05/16/2020 1111   BILIRUBINUR NEGATIVE 05/16/2020 1111   KETONESUR NEGATIVE 05/16/2020 1111   PROTEINUR NEGATIVE 05/16/2020 1111   NITRITE NEGATIVE 05/16/2020 1111   LEUKOCYTESUR NEGATIVE 05/16/2020 1111   Sepsis Labs Invalid input(s): PROCALCITONIN,  WBC,  LACTICIDVEN Microbiology Recent Results (from the past 240 hour(s))  Aerobic Culture w Gram Stain (superficial specimen)     Status: None   Collection Time: 07/28/20 12:47 PM   Specimen: Ulcer; Wound  Result Value Ref Range Status   Specimen Description   Final    ULCER Performed at [facility], [address] [place], [place] [address]    Special Requests   Final    NONE Performed at [facility], [address] [place], [place] [address]    Gram Stain   Final    RARE WBC PRESENT,BOTH PMN AND MONONUCLEAR RARE GRAM POSITIVE COCCI    Culture   Final    RARE NORMAL SKIN FLORA Performed at [facility], [place] [address] [place], [place] [address]    Report Status 07/30/2020 FINAL  Final  Blood culture (routine x 2)     Status: None (Preliminary result)   Collection Time: 07/28/20  1:00 PM   Specimen: BLOOD  Result Value Ref Range Status   Specimen Description BLOOD BLOOD LEFT ARM  Final   Special Requests   Final    Blood Culture adequate volume BOTTLES DRAWN AEROBIC AND ANAEROBIC   Culture   Final    NO GROWTH 4 DAYS Performed at [facility], [address] [place], [place] [address]    Report Status PENDING  Incomplete  Blood culture (routine x 2)     Status: None (Preliminary result)   Collection Time: 07/28/20  1:00 PM   Specimen: BLOOD  Result Value Ref Range Status   Specimen Description BLOOD LEFT ANTECUBITAL  Final   Special Requests   Final    Blood Culture adequate volume BOTTLES DRAWN AEROBIC AND ANAEROBIC   Culture   Final    NO GROWTH 4 DAYS Performed at [facility], [address], [place], [place] [address]    Report Status PENDING  Incomplete  Resp Panel by RT-PCR (Flu A&B, Covid) Nasopharyngeal Swab     Status: None   Collection Time: 07/28/20  5:07 PM   Specimen: Nasopharyngeal Swab; Nasopharyngeal(NP) swabs in vial transport medium  Result Value Ref Range Status   SARS Coronavirus 2 by RT PCR NEGATIVE NEGATIVE Final    Comment: (NOTE) SARS-CoV-2 target nucleic acids are NOT DETECTED.  The SARS-CoV-2 RNA  is generally detectable in upper respiratory specimens during the acute phase of infection. The lowest concentration of SARS-CoV-2 viral copies this assay can detect is 138 copies/mL. A negative result does not preclude SARS-Cov-2 infection and should not be used as the sole basis for treatment or other patient management decisions. A negative result may occur with  improper specimen collection/handling, submission of specimen other than nasopharyngeal swab, presence of viral mutation(s) within the areas targeted by this assay, and inadequate number of viral copies(<138 copies/mL). A negative result must be combined with clinical observations, patient history, and epidemiological information. The expected result is Negative.  Fact Sheet for Patients:  EntrepreneurPulse.com.au  Fact Sheet for Healthcare Providers:  IncredibleEmployment.be  This test is no t yet approved or cleared by the [place] FDA and  has been authorized for detection and/or diagnosis of SARS-CoV-2 by FDA under an Emergency Use Authorization (EUA). This EUA will remain  in effect (meaning this test can be used) for the duration of the COVID-19 declaration under Section 564(b)(1) of the Act, 21 U.S.C.section 360bbb-3(b)(1), unless the authorization is terminated  or revoked sooner.       Influenza A by PCR NEGATIVE NEGATIVE Final   Influenza B by PCR NEGATIVE NEGATIVE Final    Comment: (NOTE) The Xpert Xpress  SARS-CoV-2/FLU/RSV plus assay is intended as an aid in the diagnosis of influenza from Nasopharyngeal swab specimens and should not be used as a sole basis for treatment. Nasal washings and aspirates are unacceptable for Xpert Xpress SARS-CoV-2/FLU/RSV testing.  Fact Sheet for Patients: EntrepreneurPulse.com.au  Fact Sheet for Healthcare Providers: IncredibleEmployment.be  This test is not yet approved or cleared by the [place] FDA and has been authorized for detection and/or diagnosis of SARS-CoV-2 by FDA under an Emergency Use Authorization (EUA). This EUA will remain in effect (meaning this test can be used) for the duration of the COVID-19 declaration under Section 564(b)(1) of the Act, 21 U.S.C. section 360bbb-3(b)(1), unless the authorization is terminated or revoked.  Performed at [facility], [address] [place], [place] [address]   MRSA PCR Screening     Status: None   Collection Time: 07/28/20  5:42 PM   Specimen: Nasopharyngeal  Result Value Ref Range Status   MRSA by PCR NEGATIVE NEGATIVE Final    Comment:        The GeneXpert MRSA Assay (FDA approved for NASAL specimens only), is one component of a comprehensive MRSA colonization surveillance program. It is not intended to diagnose MRSA infection nor to guide or monitor treatment for MRSA infections. Performed at [facility], [address] [place], [place] [address]   Surgical PCR screen     Status: None   Collection Time: 07/30/20  3:30 PM   Specimen: Nasal Mucosa; Nasal Swab  Result Value Ref Range Status   MRSA, PCR NEGATIVE NEGATIVE Final   Staphylococcus aureus NEGATIVE NEGATIVE Final    Comment: (NOTE) The Xpert SA Assay (FDA approved for NASAL specimens in patients 11 years of age and older), is one component of a comprehensive surveillance program. It is not intended to diagnose infection nor to guide or monitor treatment. Performed at [facility], [address], [place], [place] [address]   Anaerobic culture w Gram Stain     Status: None (Preliminary result)   Collection Time: 07/31/20  1:30 PM   Specimen: PATH Digit amputation; Tissue  Result Value Ref Range Status   Specimen Description TISSUE BONE TOE  Final   Special  Requests 5TH TOE  Final   Gram Stain   Final    RARE WBC PRESENT,BOTH PMN AND MONONUCLEAR NO ORGANISMS SEEN Performed at [facility], [place] [address], [place], [place] [address]    Culture PENDING  Incomplete   Report Status PENDING  Incomplete  Aerobic/Anaerobic Culture w Gram Stain (surgical/deep wound)     Status: None (Preliminary result)   Collection Time: 07/31/20  1:36 PM   Specimen: PATH Cytology Misc. fluid; Body Fluid  Result Value Ref Range Status   Specimen Description   Final    TOE RIGHT FIFTH Performed at [facility], [address], [place], [place] [address]    Special Requests   Final    NONE Performed at [facility], [address] [place], [place] [address]    Gram Stain   Final    RARE WBC PRESENT,BOTH PMN AND MONONUCLEAR RARE GRAM POSITIVE COCCI IN PAIRS Performed at [facility], [place] [address], [place], [place] [address]    Culture PENDING  Incomplete   Report Status PENDING  Incomplete     Time coordinating discharge: 35 minutes  SIGNED:   Rodena Goldmann, DO [facility] 08/01/2020, 10:32 AM  If 7PM-7AM, please contact night-coverage [email or web address]

## 2020-08-01 NOTE — Plan of Care (Signed)
  Problem: Education: Goal: Knowledge of General Education information will improve Description: Including pain rating scale, medication(s)/side effects and non-pharmacologic comfort measures 08/01/2020 1601 by Zachery Dakins, RN Outcome: Adequate for Discharge 08/01/2020 0809 by Zachery Dakins, RN Outcome: Progressing   Problem: Health Behavior/Discharge Planning: Goal: Ability to manage health-related needs will improve 08/01/2020 1601 by Zachery Dakins, RN Outcome: Adequate for Discharge 08/01/2020 0809 by Zachery Dakins, RN Outcome: Progressing   Problem: Clinical Measurements: Goal: Ability to maintain clinical measurements within normal limits will improve 08/01/2020 1601 by Zachery Dakins, RN Outcome: Adequate for Discharge 08/01/2020 0809 by Zachery Dakins, RN Outcome: Progressing Goal: Will remain free from infection 08/01/2020 1601 by Zachery Dakins, RN Outcome: Adequate for Discharge 08/01/2020 0809 by Zachery Dakins, RN Outcome: Progressing Goal: Diagnostic test results will improve 08/01/2020 1601 by Zachery Dakins, RN Outcome: Adequate for Discharge 08/01/2020 0809 by Zachery Dakins, RN Outcome: Progressing Goal: Respiratory complications will improve 08/01/2020 1601 by Zachery Dakins, RN Outcome: Adequate for Discharge 08/01/2020 0809 by Zachery Dakins, RN Outcome: Progressing Goal: Cardiovascular complication will be avoided 08/01/2020 1601 by Zachery Dakins, RN Outcome: Adequate for Discharge 08/01/2020 0809 by Zachery Dakins, RN Outcome: Progressing   Problem: Activity: Goal: Risk for activity intolerance will decrease 08/01/2020 1601 by Zachery Dakins, RN Outcome: Adequate for Discharge 08/01/2020 0809 by Zachery Dakins, RN Outcome: Progressing   Problem: Nutrition: Goal: Adequate nutrition will be maintained 08/01/2020 1601 by Zachery Dakins, RN Outcome:  Adequate for Discharge 08/01/2020 0809 by Zachery Dakins, RN Outcome: Progressing   Problem: Coping: Goal: Level of anxiety will decrease 08/01/2020 1601 by Zachery Dakins, RN Outcome: Adequate for Discharge 08/01/2020 0809 by Zachery Dakins, RN Outcome: Progressing   Problem: Elimination: Goal: Will not experience complications related to bowel motility 08/01/2020 1601 by Zachery Dakins, RN Outcome: Adequate for Discharge 08/01/2020 0809 by Zachery Dakins, RN Outcome: Progressing Goal: Will not experience complications related to urinary retention 08/01/2020 1601 by Zachery Dakins, RN Outcome: Adequate for Discharge 08/01/2020 0809 by Zachery Dakins, RN Outcome: Progressing   Problem: Pain Managment: Goal: General experience of comfort will improve 08/01/2020 1601 by Zachery Dakins, RN Outcome: Adequate for Discharge 08/01/2020 0809 by Zachery Dakins, RN Outcome: Progressing   Problem: Safety: Goal: Ability to remain free from injury will improve 08/01/2020 1601 by Zachery Dakins, RN Outcome: Adequate for Discharge 08/01/2020 0809 by Zachery Dakins, RN Outcome: Progressing   Problem: Skin Integrity: Goal: Risk for impaired skin integrity will decrease 08/01/2020 1601 by Zachery Dakins, RN Outcome: Adequate for Discharge 08/01/2020 0809 by Zachery Dakins, RN Outcome: Progressing   Problem: Increased Nutrient Needs (NI-5.1) Goal: Food and/or nutrient delivery Description: Individualized approach for food/nutrient provision. Outcome: Adequate for Discharge

## 2020-08-01 NOTE — Plan of Care (Signed)

## 2020-08-01 NOTE — TOC Transition Note (Signed)
Transition of Care [facility]) - CM/SW Discharge Note   Patient Details  Name: Christina Lamb MRN: [id number] Date of Birth: 1961-02-20  [facility] CM/SW Contact:  Elliot Gault, LCSW Phone Number: 08/01/2020, 11:26 AM   Clinical Narrative:     Pt stable for dc today per MD. OPAT orders are entered. Updated Pam at [facility]. Per Pam, she will be here around 2pm to do teaching with pt. Pam has arranged Home RN for further management at home. Updated pt's bedside RN. Pt will dc home today after her teaching with Pam and after IV anbx dose this afternoon.   Final next level of care: Home w Home Health Services Barriers to Discharge: Barriers Resolved   Patient Goals and CMS Choice Patient states their goals for this hospitalization and ongoing recovery are:: go home CMS Medicare.gov Compare Post Acute Care list provided to:: Patient Choice offered to / list presented to : Patient  Discharge Placement                       Discharge Plan and Services In-house Referral: Clinical Social Work   Post Acute Care Choice: Home Health                    HH Arranged: IV Antibiotics HH Agency: [facility] Date [facility] Agency Contacted: 07/31/20   Representative spoke with at [facility] Agency: Pam  Social Determinants of Health (SDOH) Interventions     Readmission Risk Interventions No flowsheet data found.

## 2020-08-01 NOTE — Telephone Encounter (Signed)
Thank you soo much  

## 2020-08-02 LAB — CULTURE, BLOOD (ROUTINE X 2)
Culture: NO GROWTH
Culture: NO GROWTH
Special Requests: ADEQUATE
Special Requests: ADEQUATE

## 2020-08-02 LAB — SURGICAL PATHOLOGY

## 2020-08-05 LAB — ANAEROBIC CULTURE W GRAM STAIN

## 2020-08-06 LAB — AEROBIC/ANAEROBIC CULTURE W GRAM STAIN (SURGICAL/DEEP WOUND)

## 2020-09-25 ENCOUNTER — Other Ambulatory Visit (HOSPITAL_COMMUNITY): Payer: Self-pay | Admitting: Podiatry

## 2020-09-25 ENCOUNTER — Other Ambulatory Visit: Payer: Self-pay | Admitting: Podiatry

## 2020-09-25 DIAGNOSIS — M86171 Other acute osteomyelitis, right ankle and foot: Secondary | ICD-10-CM

## 2020-10-10 ENCOUNTER — Ambulatory Visit (HOSPITAL_COMMUNITY): Payer: No Typology Code available for payment source

## 2020-10-11 ENCOUNTER — Other Ambulatory Visit: Payer: Self-pay | Admitting: Podiatry

## 2020-10-11 NOTE — Patient Instructions (Signed)
Your procedure is scheduled on: 10/18/2020  Report to [facility] Main Entrance at   6:15  AM.  Call this number if you have problems the morning of surgery: [phone]   Remember:   Do not Eat or Drink after midnight         No Smoking the morning of surgery  :  Take these medicines the morning of surgery with A SIP OF WATER: Tramadol and/or albuterol if needed  No diabetic medication am of surgery          Do not wear jewelry, make-up or nail polish.  Do not wear lotions, powders, or perfumes. You may wear deodorant.  Do not shave 48 hours prior to surgery. Men may shave face and neck.  Do not bring valuables to the hospital.  Contacts, dentures or bridgework may not be worn into surgery.  Leave suitcase in the car. After surgery it may be brought to your room.  For patients admitted to the hospital, checkout time is 11:00 AM the day of discharge.   Patients discharged the day of surgery will not be allowed to drive home.    Special Instructions: Shower using CHG night before surgery and shower the day of surgery use CHG.  Use special wash - you have one bottle of CHG for all showers.  You should use approximately 1/2 of the bottle for each shower.  How to Use Chlorhexidine for Bathing Chlorhexidine gluconate (CHG) is a germ-killing (antiseptic) solution that is used to clean the skin. It can get rid of the bacteria that normally live on the skin and can keep them away for about 24 hours. To clean your skin with CHG, you may be given:  A CHG solution to use in the shower or as part of a sponge bath.  A prepackaged cloth that contains CHG. Cleaning your skin with CHG may help lower the risk for infection:  While you are staying in the intensive care unit of the hospital.  If you have a vascular access, such as a central line, to provide short-term or long-term access to your veins.  If you have a catheter to drain urine from your bladder.  If you are on a  ventilator. A ventilator is a machine that helps you breathe by moving air in and out of your lungs.  After surgery. What are the risks? Risks of using CHG include:  A skin reaction.  Hearing loss, if CHG gets in your ears.  Eye injury, if CHG gets in your eyes and is not rinsed out.  The CHG product catching fire. Make sure that you avoid smoking and flames after applying CHG to your skin. Do not use CHG:  If you have a chlorhexidine allergy or have previously reacted to chlorhexidine.  On babies younger than 33 months of age. How to use CHG solution  Use CHG only as told by your health care provider, and follow the instructions on the label.  Use the full amount of CHG as directed. Usually, this is one bottle. During a shower Follow these steps when using CHG solution during a shower (unless your health care provider gives you different instructions): 1. Start the shower. 2. Use your normal soap and shampoo to wash your face and hair. 3. Turn off the shower or move out of the shower stream. 4. Pour the CHG onto a clean washcloth. Do not use any type of brush or rough-edged sponge. 5. Starting at your neck, lather your body  down to your toes. Make sure you follow these instructions: ? If you will be having surgery, pay special attention to the part of your body where you will be having surgery. Scrub this area for at least 1 minute. ? Do not use CHG on your head or face. If the solution gets into your ears or eyes, rinse them well with water. ? Avoid your genital area. ? Avoid any areas of skin that have broken skin, cuts, or scrapes. ? Scrub your back and under your arms. Make sure to wash skin folds. 6. Let the lather sit on your skin for 1-2 minutes or as long as told by your health care provider. 7. Thoroughly rinse your entire body in the shower. Make sure that all body creases and crevices are rinsed well. 8. Dry off with a clean towel. Do not put any substances on your  body afterward--such as powder, lotion, or perfume--unless you are told to do so by your health care provider. Only use lotions that are recommended by the manufacturer. 9. Put on clean clothes or pajamas. 10. If it is the night before your surgery, sleep in clean sheets.   During a sponge bath Follow these steps when using CHG solution during a sponge bath (unless your health care provider gives you different instructions): 1. Use your normal soap and shampoo to wash your face and hair. 2. Pour the CHG onto a clean washcloth. 3. Starting at your neck, lather your body down to your toes. Make sure you follow these instructions: ? If you will be having surgery, pay special attention to the part of your body where you will be having surgery. Scrub this area for at least 1 minute. ? Do not use CHG on your head or face. If the solution gets into your ears or eyes, rinse them well with water. ? Avoid your genital area. ? Avoid any areas of skin that have broken skin, cuts, or scrapes. ? Scrub your back and under your arms. Make sure to wash skin folds. 4. Let the lather sit on your skin for 1-2 minutes or as long as told by your health care provider. 5. Using a different clean, wet washcloth, thoroughly rinse your entire body. Make sure that all body creases and crevices are rinsed well. 6. Dry off with a clean towel. Do not put any substances on your body afterward--such as powder, lotion, or perfume--unless you are told to do so by your health care provider. Only use lotions that are recommended by the manufacturer. 7. Put on clean clothes or pajamas. 8. If it is the night before your surgery, sleep in clean sheets. How to use CHG prepackaged cloths  Only use CHG cloths as told by your health care provider, and follow the instructions on the label.  Use the CHG cloth on clean, dry skin.  Do not use the CHG cloth on your head or face unless your health care provider tells you to.  When washing  with the CHG cloth: ? Avoid your genital area. ? Avoid any areas of skin that have broken skin, cuts, or scrapes. Before surgery Follow these steps when using a CHG cloth to clean before surgery (unless your health care provider gives you different instructions): 1. Using the CHG cloth, vigorously scrub the part of your body where you will be having surgery. Scrub using a back-and-forth motion for 3 minutes. The area on your body should be completely wet with CHG when you are done scrubbing.  2. Do not rinse. Discard the cloth and let the area air-dry. Do not put any substances on the area afterward, such as powder, lotion, or perfume. 3. Put on clean clothes or pajamas. 4. If it is the night before your surgery, sleep in clean sheets.   For general bathing Follow these steps when using CHG cloths for general bathing (unless your health care provider gives you different instructions). 1. Use a separate CHG cloth for each area of your body. Make sure you wash between any folds of skin and between your fingers and toes. Wash your body in the following order, switching to a new cloth after each step: ? The front of your neck, shoulders, and chest. ? Both of your arms, under your arms, and your hands. ? Your stomach and groin area, avoiding the genitals. ? Your right leg and foot. ? Your left leg and foot. ? The back of your neck, your back, and your buttocks. 2. Do not rinse. Discard the cloth and let the area air-dry. Do not put any substances on your body afterward--such as powder, lotion, or perfume--unless you are told to do so by your health care provider. Only use lotions that are recommended by the manufacturer. 3. Put on clean clothes or pajamas. Contact a health care provider if:  Your skin gets irritated after scrubbing.  You have questions about using your solution or cloth. Get help right away if:  Your eyes become very red or swollen.  Your eyes itch badly.  Your skin itches  badly and is red or swollen.  Your hearing changes.  You have trouble seeing.  You have swelling or tingling in your mouth or throat.  You have trouble breathing.  You swallow any chlorhexidine. Summary  Chlorhexidine gluconate (CHG) is a germ-killing (antiseptic) solution that is used to clean the skin. Cleaning your skin with CHG may help to lower your risk for infection.  You may be given CHG to use for bathing. It may be in a bottle or in a prepackaged cloth to use on your skin. Carefully follow your health care provider's instructions and the instructions on the product label.  Do not use CHG if you have a chlorhexidine allergy.  Contact your health care provider if your skin gets irritated after scrubbing. This information is not intended to replace advice given to you by your health care provider. Make sure you discuss any questions you have with your health care provider. Document Revised: 10/08/2019 Document Reviewed: 10/08/2019 Elsevier Patient Education  2021 [employer]  Toe Amputation, Care After This sheet gives you information about how to care for yourself after your procedure. Your health care provider may also give you more specific instructions. If you have problems or questions, contact your health care provider. What can I expect after the procedure? After the procedure, it is common to have some pain. Pain usually improves within a week. Follow these instructions at home: Medicines  Take over-the-counter and prescription medicines only as told by your health care provider.  If you were prescribed an antibiotic medicine, take it as told by your health care provider. Do not stop taking the antibiotic even if you start to feel better.  Ask your health care provider if the medicine prescribed to you: ? Requires you to avoid driving or using heavy machinery. ? Can cause constipation. You may need to take actions to prevent or treat constipation, such  as:  Drink enough fluid to keep your urine pale yellow.  Take over-the-counter or prescription medicines.  Eat foods that are high in fiber, such as beans, whole grains, and fresh fruits and vegetables.  Limit foods that are high in fat and processed sugars, such as fried or sweet foods. Managing pain, stiffness, and swelling  If directed, put ice on the painful area. ? Put ice in a plastic bag. ? Place a towel between your skin and the bag. ? Leave the ice on for 20 minutes, 2-3 times a day.  Move your other toes often to reduce stiffness and swelling.  Raise (elevate) your foot above the level of your heart while you are sitting or lying down.   Incision care  Follow instructions from your health care provider about how to take care of your incision. Make sure you: ? Wash your hands with soap and water before and after you change your bandage (dressing). If soap and water are not available, use hand sanitizer. ? Change your dressing as told by your health care provider. ? Leave stitches (sutures), skin glue, or adhesive strips in place. These skin closures may need to stay in place for 2 weeks or longer. If adhesive strip edges start to loosen and curl up, you may trim the loose edges. Do not remove adhesive strips completely unless your health care provider tells you to do that.  Check your incision area every day for signs of infection. Check for: ? More redness, swelling, or pain. ? Fluid or blood. ? Warmth. ? Pus or a bad smell.      Bathing  Do not take baths, swim, use a hot tub, or soak your foot until your health care provider approves. Ask your health care provider if you may take showers. You may only be allowed to take sponge baths.  If your dressing has been removed, you may wash your skin with warm water and soap. Activity  Rest as told by your health care provider.  Avoid sitting for a long time without moving. Get up to take short walks every 1-2 hours. This  is important to improve blood flow and breathing. Ask for help if you feel weak or unsteady.  If you have been given crutches, use them as told by your health care provider.  Do exercises as told by your health care provider.  Return to your normal activities as told by your health care provider. Ask your health care provider what activities are safe for you. General instructions  Do not drive for 24 hours if you were given a sedative during your procedure.  Do not use any products that contain nicotine or tobacco, such as cigarettes, e-cigarettes, and chewing tobacco. If you need help quitting, ask your health care provider.  Ask your health care provider about wearing supportive shoes or using inserts to help with your balance when walking.  If you have diabetes, keep your blood sugar under good control and check your feet daily for sores or open areas.  Keep all follow-up visits as told by your health care provider. This is important. Contact a health care provider if:  You have signs of infection at your incision, such as: ? Redness, swelling, or pain. ? Fluid or blood. ? Warmth. ? Pus or a bad smell.  You have a fever or chills.  Your dressing is soaked with blood.  Your sutures tear or they separate.  You have numbness or tingling in your toes or foot.  Your foot is cool or pale, or it changes color.  Your pain does not improve after you take your medicine. Get help right away if you have:  Pain or swelling near your incision that gets worse or does not go away.  Red streaks on your skin near your toes, foot, or leg.  Pain in your calf or behind your knee.  Shortness of breath.  Chest pain. Summary  After the procedure, it is common to have some pain. Pain usually improves within a week.  If you were prescribed an antibiotic medicine, take it as told by your health care provider. Do not stop taking the antibiotic even if you start to feel better.  Change  your dressing as told by your health care provider.  Contact a health care provider if you have signs of infection.  Keep all follow-up visits as told by your health care provider. This is important. This information is not intended to replace advice given to you by your health care provider. Make sure you discuss any questions you have with your health care provider. Document Revised: 08/12/2018 Document Reviewed: 04/14/2018 Elsevier Patient Education  2021 [employer] Inc.  General Anesthesia, Adult, Care After This sheet gives you information about how to care for yourself after your procedure. Your health care provider may also give you more specific instructions. If you have problems or questions, contact your health care provider. What can I expect after the procedure? After the procedure, the following side effects are common:  Pain or discomfort at the IV site.  Nausea.  Vomiting.  Sore throat.  Trouble concentrating.  Feeling cold or chills.  Feeling weak or tired.  Sleepiness and fatigue.  Soreness and body aches. These side effects can affect parts of the body that were not involved in surgery. Follow these instructions at home: For the time period you were told by your health care provider:  Rest.  Do not participate in activities where you could fall or become injured.  Do not drive or use machinery.  Do not drink alcohol.  Do not take sleeping pills or medicines that cause drowsiness.  Do not make important decisions or sign legal documents.  Do not take care of children on your own.   Eating and drinking  Follow any instructions from your health care provider about eating or drinking restrictions.  When you feel hungry, start by eating small amounts of foods that are soft and easy to digest (bland), such as toast. Gradually return to your regular diet.  Drink enough fluid to keep your urine pale yellow.  If you vomit, rehydrate by drinking water,  juice, or clear broth. General instructions  If you have sleep apnea, surgery and certain medicines can increase your risk for breathing problems. Follow instructions from your health care provider about wearing your sleep device: ? Anytime you are sleeping, including during daytime naps. ? While taking prescription pain medicines, sleeping medicines, or medicines that make you drowsy.  Have a responsible adult stay with you for the time you are told. It is important to have someone help care for you until you are awake and alert.  Return to your normal activities as told by your health care provider. Ask your health care provider what activities are safe for you.  Take over-the-counter and prescription medicines only as told by your health care provider.  If you smoke, do not smoke without supervision.  Keep all follow-up visits as told by your health care provider. This is important. Contact a health care provider if:  You have  nausea or vomiting that does not get better with medicine.  You cannot eat or drink without vomiting.  You have pain that does not get better with medicine.  You are unable to pass urine.  You develop a skin rash.  You have a fever.  You have redness around your IV site that gets worse. Get help right away if:  You have difficulty breathing.  You have chest pain.  You have blood in your urine or stool, or you vomit blood. Summary  After the procedure, it is common to have a sore throat or nausea. It is also common to feel tired.  Have a responsible adult stay with you for the time you are told. It is important to have someone help care for you until you are awake and alert.  When you feel hungry, start by eating small amounts of foods that are soft and easy to digest (bland), such as toast. Gradually return to your regular diet.  Drink enough fluid to keep your urine pale yellow.  Return to your normal activities as told by your health care  provider. Ask your health care provider what activities are safe for you. This information is not intended to replace advice given to you by your health care provider. Make sure you discuss any questions you have with your health care provider. Document Revised: 01/06/2020 Document Reviewed: 08/05/2019 Elsevier Patient Education  2021 [employer].

## 2020-10-12 ENCOUNTER — Other Ambulatory Visit: Payer: Self-pay | Admitting: Podiatry

## 2020-10-16 ENCOUNTER — Encounter (HOSPITAL_COMMUNITY)
Admission: RE | Admit: 2020-10-16 | Discharge: 2020-10-16 | Disposition: A | Discharge status: Home / Self Care | Payer: No Typology Code available for payment source | Source: Ambulatory Visit | Attending: Podiatry | Admitting: Podiatry

## 2020-10-16 ENCOUNTER — Other Ambulatory Visit: Payer: Self-pay

## 2020-10-16 ENCOUNTER — Encounter (HOSPITAL_COMMUNITY): Payer: Self-pay

## 2020-10-16 DIAGNOSIS — Z01812 Encounter for preprocedural laboratory examination: Secondary | ICD-10-CM | POA: Diagnosis present

## 2020-10-16 LAB — BASIC METABOLIC PANEL
Anion gap: 10 (ref 5–15)
BUN: 15 mg/dL (ref 6–20)
CO2: 22 mmol/L (ref 22–32)
Calcium: 9 mg/dL (ref 8.9–10.3)
Chloride: 104 mmol/L (ref 98–111)
Creatinine, Ser: 0.98 mg/dL (ref 0.44–1.00)
GFR, Estimated: >60 mL/min (ref >60)
Glucose, Bld: 175 mg/dL — ABNORMAL HIGH (ref 70–99)
Potassium: 4.4 mmol/L (ref 3.5–5.1)
Sodium: 136 mmol/L (ref 135–145)

## 2020-10-16 NOTE — Progress Notes (Deleted)
Cardiac Clearance given by Dr. Eden Emms. It is okay to proceed with EGD.

## 2020-10-18 ENCOUNTER — Ambulatory Visit (HOSPITAL_COMMUNITY): Payer: No Typology Code available for payment source | Admitting: Certified Registered Nurse Anesthetist

## 2020-10-18 ENCOUNTER — Other Ambulatory Visit: Payer: Self-pay

## 2020-10-18 ENCOUNTER — Encounter (HOSPITAL_COMMUNITY): Payer: Self-pay

## 2020-10-18 ENCOUNTER — Ambulatory Visit (HOSPITAL_COMMUNITY): Payer: No Typology Code available for payment source

## 2020-10-18 ENCOUNTER — Encounter (HOSPITAL_COMMUNITY)
Admission: RE | Disposition: A | Discharge status: Home / Self Care | Payer: Self-pay | Source: Ambulatory Visit | Attending: Podiatry

## 2020-10-18 ENCOUNTER — Ambulatory Visit (HOSPITAL_COMMUNITY)
Admission: RE | Admit: 2020-10-18 | Discharge: 2020-10-18 | Disposition: A | Discharge status: Home / Self Care | Payer: No Typology Code available for payment source | Source: Ambulatory Visit | Attending: Podiatry | Admitting: Podiatry

## 2020-10-18 DIAGNOSIS — M869 Osteomyelitis, unspecified: Secondary | ICD-10-CM | POA: Insufficient documentation

## 2020-10-18 DIAGNOSIS — Z9889 Other specified postprocedural states: Secondary | ICD-10-CM

## 2020-10-18 HISTORY — PX: AMPUTATION TOE: SHX6595

## 2020-10-18 LAB — GLUCOSE, CAPILLARY
Glucose-Capillary: 205 mg/dL — ABNORMAL HIGH (ref 70–99)
Glucose-Capillary: 199 mg/dL — ABNORMAL HIGH (ref 70–99)

## 2020-10-18 LAB — HEMOGLOBIN A1C
Hgb A1c MFr Bld: 7.2 % — ABNORMAL HIGH (ref 4.8–5.6)
Mean Plasma Glucose: 160 mg/dL

## 2020-10-18 IMAGING — DX DG FOOT COMPLETE 3+V*R*
3 series · 3 of 3 positions shown · non-contrast
Comparison: None.

CLINICAL DATA: Postop amputation of the third toe.

EXAM:
RIGHT FOOT COMPLETE - 3+ VIEW

[foot ap]
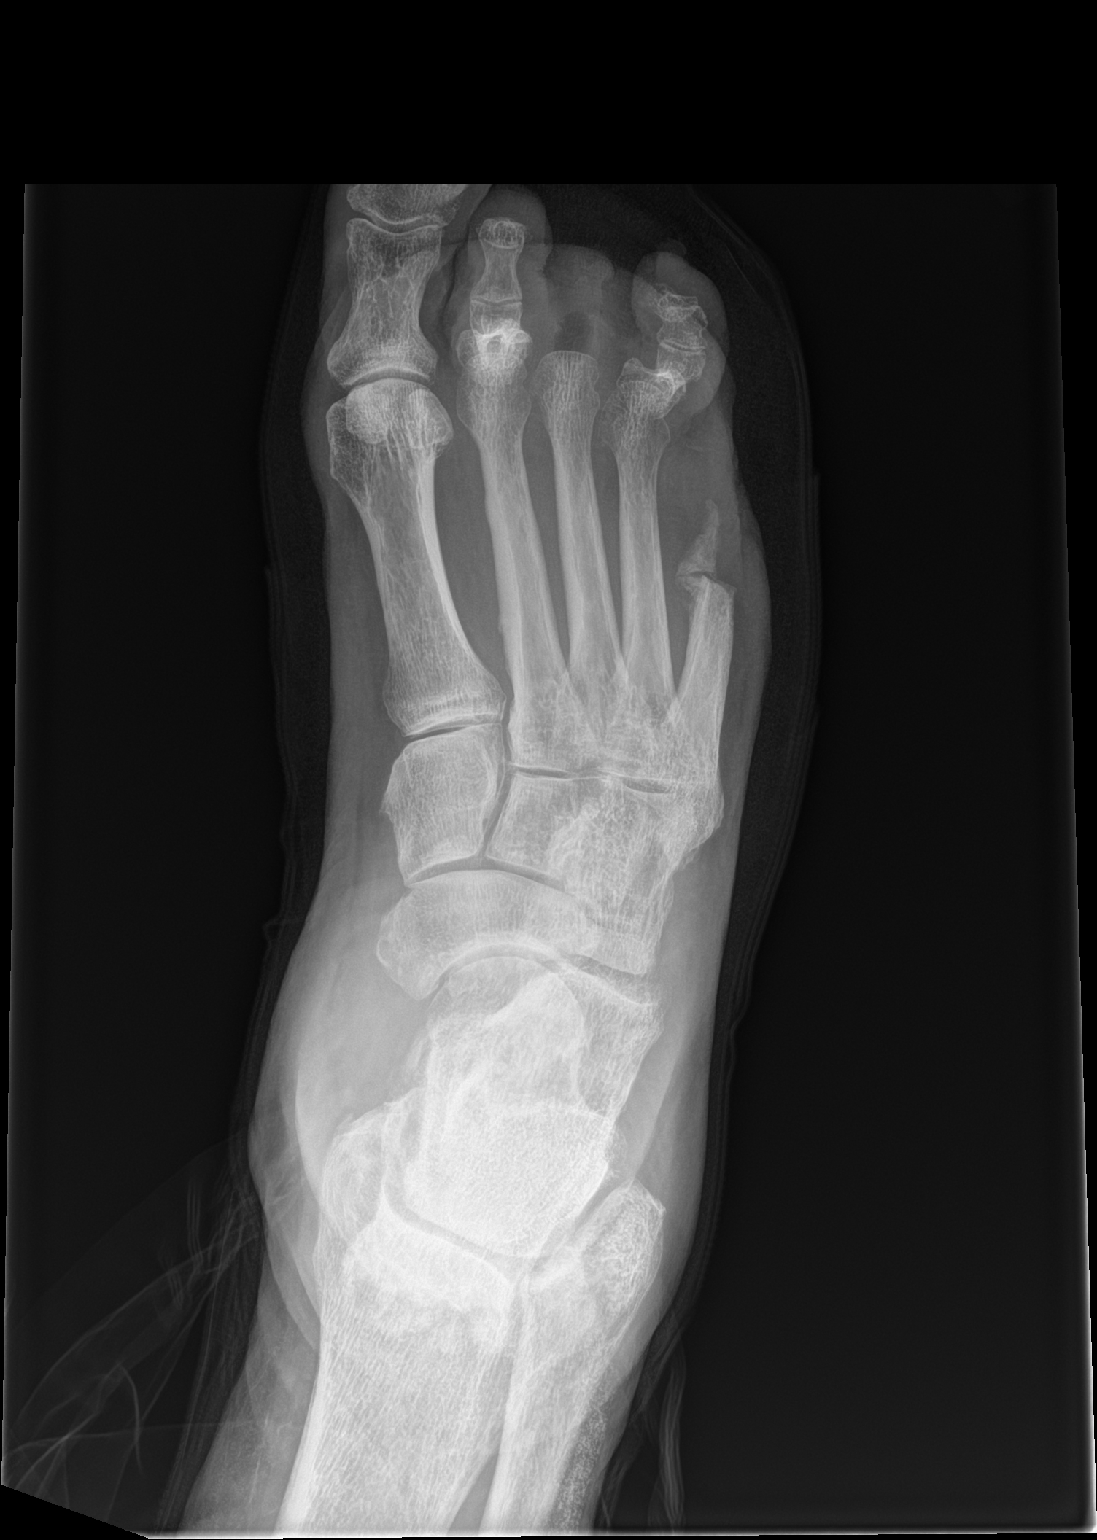

[foot obl]
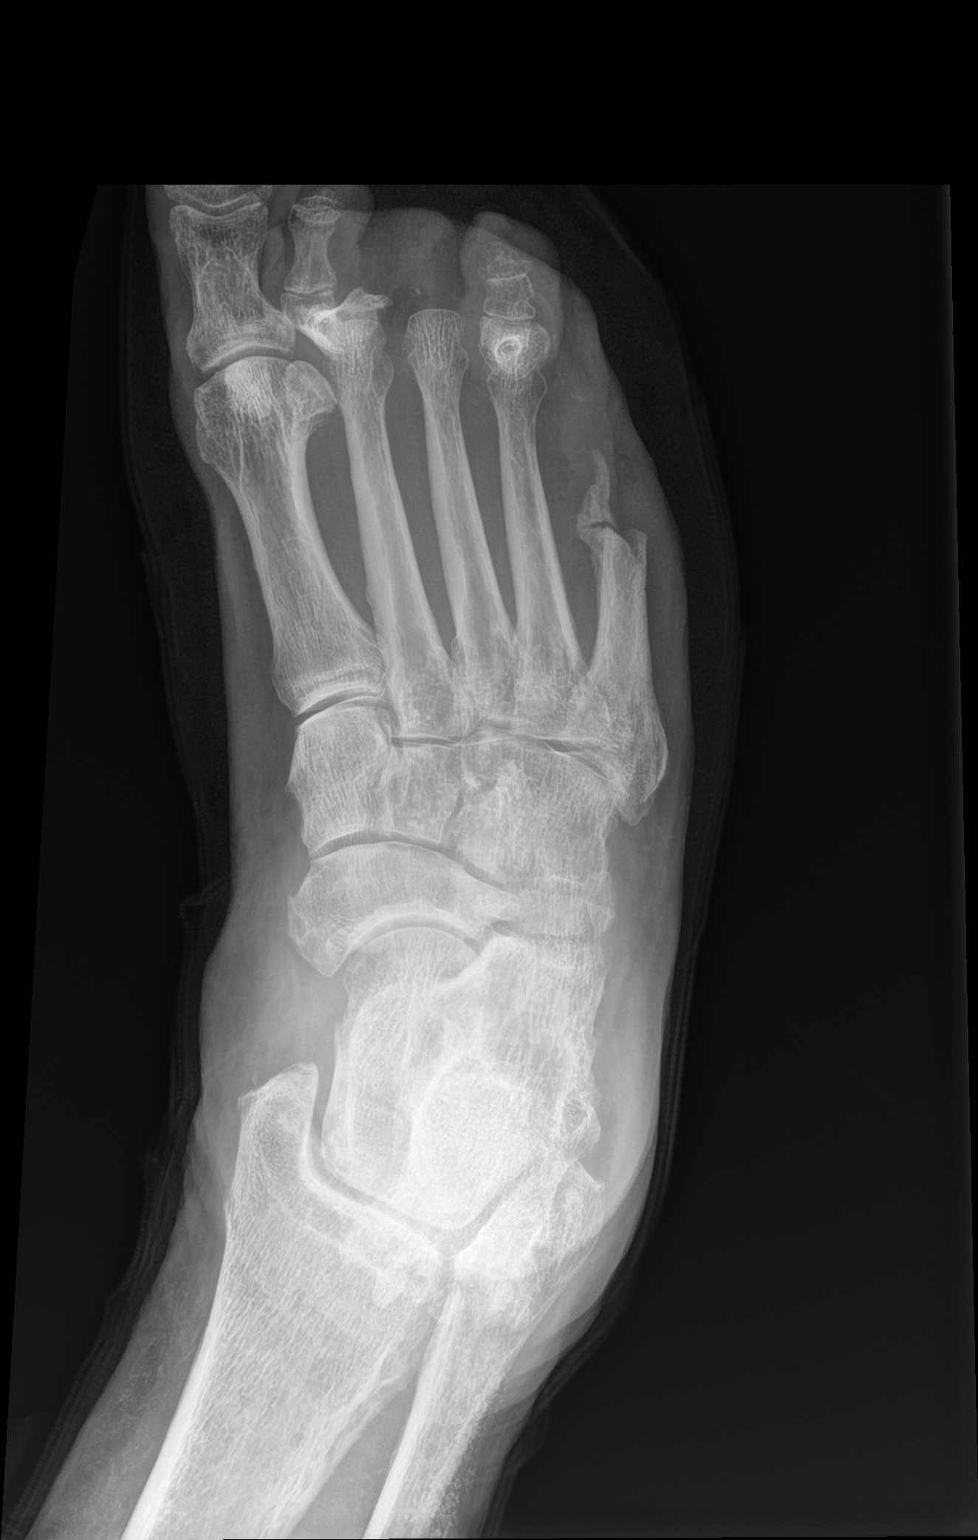

[foot lat]
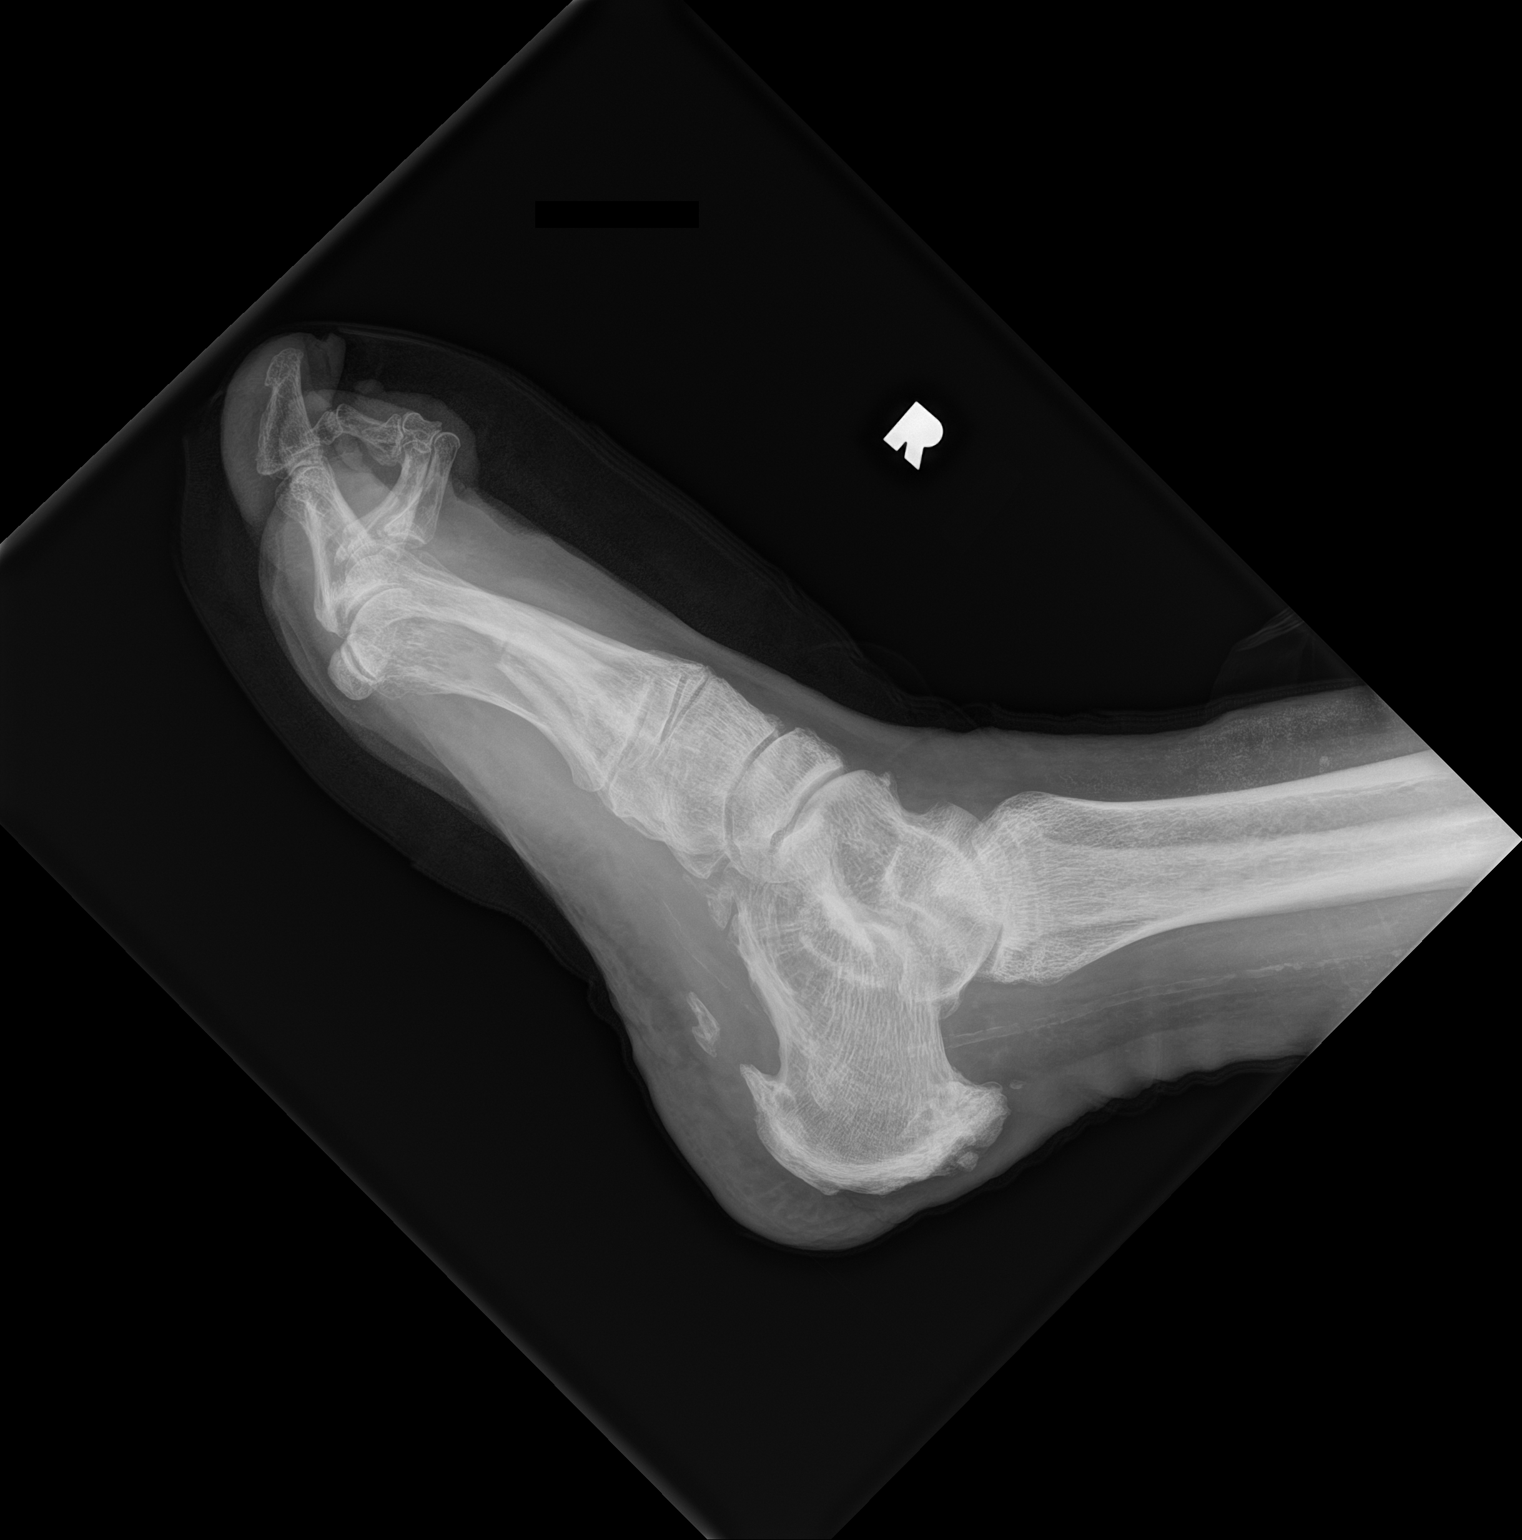

[3 of 3 positions shown; findings below may reference images not displayed]

FINDINGS: Expected postoperative changes with amputation of the third toe.
Prior amputation of the fifth toe and half of the fifth metatarsal.
Severe flexion deformities at the second and fourth MTP joints. No
new destructive bony changes.
IMPRESSION: 1. Expected postoperative changes with amputation of the third toe.
2. Prior amputation of the fifth toe and half of the fifth
metatarsal.

## 2020-10-18 SURGERY — AMPUTATION, TOE
Anesthesia: General | Site: Toe | Laterality: Right

## 2020-10-18 MED ORDER — CHLORHEXIDINE GLUCONATE 0.12 % MT SOLN
OROMUCOSAL | Status: AC
  Filled 2020-10-18: qty 15

## 2020-10-18 MED ORDER — PROPOFOL 10 MG/ML IV BOLUS
INTRAVENOUS | Status: AC
  Filled 2020-10-18: qty 60

## 2020-10-18 MED ORDER — BUPIVACAINE HCL (PF) 0.5 % IJ SOLN
INTRAMUSCULAR | Status: AC
  Filled 2020-10-18: qty 30

## 2020-10-18 MED ORDER — LIDOCAINE HCL (PF) 1 % IJ SOLN
INTRAMUSCULAR | Status: AC
  Filled 2020-10-18: qty 30

## 2020-10-18 MED ORDER — FENTANYL CITRATE (PF) 250 MCG/5ML IJ SOLN
INTRAMUSCULAR | Status: DC | PRN
  Administered 2020-10-18 (×4): 25 ug via INTRAVENOUS

## 2020-10-18 MED ORDER — CLINDAMYCIN PHOSPHATE 900 MG/50ML IV SOLN
900.0000 mg | Freq: Once | INTRAVENOUS | Status: AC
  Administered 2020-10-18: 900 mg via INTRAVENOUS
  Filled 2020-10-18: qty 50

## 2020-10-18 MED ORDER — ONDANSETRON HCL 4 MG/2ML IJ SOLN
INTRAMUSCULAR | Status: DC | PRN
  Administered 2020-10-18: 4 mg via INTRAVENOUS

## 2020-10-18 MED ORDER — PROPOFOL 10 MG/ML IV BOLUS
INTRAVENOUS | Status: DC | PRN
  Administered 2020-10-18: 250 mg via INTRAVENOUS

## 2020-10-18 MED ORDER — 0.9 % SODIUM CHLORIDE (POUR BTL) OPTIME
TOPICAL | Status: DC | PRN
  Administered 2020-10-18: 1000 mL

## 2020-10-18 MED ORDER — FENTANYL CITRATE (PF) 100 MCG/2ML IJ SOLN
INTRAMUSCULAR | Status: AC
  Filled 2020-10-18: qty 2

## 2020-10-18 MED ORDER — FENTANYL CITRATE (PF) 100 MCG/2ML IJ SOLN: 25.0000 ug | INTRAMUSCULAR | Status: DC | PRN

## 2020-10-18 MED ORDER — LACTATED RINGERS IV SOLN: INTRAVENOUS | Status: DC

## 2020-10-18 MED ORDER — LIDOCAINE HCL 1 % IJ SOLN
INTRAMUSCULAR | Status: DC | PRN
  Administered 2020-10-18: 8 mL via INTRAMUSCULAR

## 2020-10-18 MED ORDER — ORAL CARE MOUTH RINSE: 15.0000 mL | Freq: Once | OROMUCOSAL | Status: AC

## 2020-10-18 MED ORDER — MIDAZOLAM HCL 2 MG/2ML IJ SOLN
INTRAMUSCULAR | Status: AC
  Filled 2020-10-18: qty 2

## 2020-10-18 MED ORDER — DEXAMETHASONE SODIUM PHOSPHATE 10 MG/ML IJ SOLN
INTRAMUSCULAR | Status: AC
  Filled 2020-10-18: qty 1

## 2020-10-18 MED ORDER — ONDANSETRON HCL 4 MG/2ML IJ SOLN
INTRAMUSCULAR | Status: AC
  Filled 2020-10-18: qty 2

## 2020-10-18 MED ORDER — ONDANSETRON HCL 4 MG/2ML IJ SOLN: 4.0000 mg | Freq: Once | INTRAMUSCULAR | Status: DC | PRN

## 2020-10-18 MED ORDER — MIDAZOLAM HCL 2 MG/2ML IJ SOLN
INTRAMUSCULAR | Status: DC | PRN
  Administered 2020-10-18: 2 mg via INTRAVENOUS

## 2020-10-18 MED ORDER — CHLORHEXIDINE GLUCONATE 0.12 % MT SOLN
15.0000 mL | Freq: Once | OROMUCOSAL | Status: AC
  Administered 2020-10-18: 15 mL via OROMUCOSAL

## 2020-10-18 SURGICAL SUPPLY — 35 items
BANDAGE ELASTIC 4 VELCRO NS (GAUZE/BANDAGES/DRESSINGS) ×2 IMPLANT
BANDAGE ESMARK 4X12 BL STRL LF (DISPOSABLE) IMPLANT
BLADE SURG 15 STRL LF DISP TIS (BLADE) ×1 IMPLANT
BLADE SURG 15 STRL SS (BLADE) ×3
BNDG CMPR 12X4 ELC STRL LF (DISPOSABLE) ×1
BNDG CMPR STD VLCR NS LF 5.8X4 (GAUZE/BANDAGES/DRESSINGS) ×1
BNDG CONFORM 2 STRL LF (GAUZE/BANDAGES/DRESSINGS) ×3 IMPLANT
BNDG ELASTIC 4X5.8 VLCR NS LF (GAUZE/BANDAGES/DRESSINGS) ×3 IMPLANT
BNDG ESMARK 4X12 BLUE STRL LF (DISPOSABLE) ×3
BNDG GAUZE ELAST 4 BULKY (GAUZE/BANDAGES/DRESSINGS) ×2 IMPLANT
CLOTH BEACON ORANGE TIMEOUT ST (SAFETY) ×3 IMPLANT
COVER LIGHT HANDLE STERIS (MISCELLANEOUS) ×3 IMPLANT
COVER WAND RF STERILE (DRAPES) ×3 IMPLANT
CUFF TOURN SGL QUICK 18X4 (TOURNIQUET CUFF) ×3 IMPLANT
DECANTER SPIKE VIAL GLASS SM (MISCELLANEOUS) ×6 IMPLANT
DRSG ADAPTIC 3X8 NADH LF (GAUZE/BANDAGES/DRESSINGS) ×3 IMPLANT
ELECT REM PT RETURN 9FT ADLT (ELECTROSURGICAL) ×3
ELECTRODE REM PT RTRN 9FT ADLT (ELECTROSURGICAL) ×1 IMPLANT
GAUZE SPONGE 4X4 12PLY STRL (GAUZE/BANDAGES/DRESSINGS) ×3 IMPLANT
GLOVE SURG LTX SZ7 (GLOVE) ×6 IMPLANT
GLOVE SURG UNDER POLY LF SZ7 (GLOVE) ×6 IMPLANT
GOWN STRL REUS W/ TWL LRG LVL3 (GOWN DISPOSABLE) IMPLANT
GOWN STRL REUS W/TWL LRG LVL3 (GOWN DISPOSABLE) ×6 IMPLANT
KIT TURNOVER KIT A (KITS) ×3 IMPLANT
MANIFOLD NEPTUNE II (INSTRUMENTS) ×3 IMPLANT
NDL HYPO 27GX1-1/4 (NEEDLE) ×2 IMPLANT
NEEDLE HYPO 27GX1-1/4 (NEEDLE) ×6 IMPLANT
NS IRRIG 1000ML POUR BTL (IV SOLUTION) ×3 IMPLANT
PACK BASIC LIMB (CUSTOM PROCEDURE TRAY) ×3 IMPLANT
PAD ARMBOARD 7.5X6 YLW CONV (MISCELLANEOUS) ×3 IMPLANT
SET BASIN LINEN APH (SET/KITS/TRAYS/PACK) ×3 IMPLANT
SUT PROLENE 3 0 PS 1 (SUTURE) ×2 IMPLANT
SWAB CULTURE ESWAB REG 1ML (MISCELLANEOUS) ×2 IMPLANT
SWAB CULTURE LIQ STUART DBL (MISCELLANEOUS) ×2 IMPLANT
SYR CONTROL 10ML LL (SYRINGE) ×6 IMPLANT

## 2020-10-18 NOTE — Discharge Instructions (Signed)

## 2020-10-18 NOTE — Transfer of Care (Signed)
Immediate Anesthesia Transfer of Care Note  Patient: Christina Lamb  Procedure(s) Performed: AMPUTATION RIGHT THIRD TOE (Right: Toe)  Patient Location: PACU  Anesthesia Type:General  Level of Consciousness: awake  Airway & Oxygen Therapy: Patient Spontanous Breathing and Patient connected to face mask oxygen  Post-op Assessment: Report given to RN and Post -op Vital signs reviewed and stable  Post vital signs: Reviewed and stable  Last Vitals:  Vitals Value Taken Time  BP 114/76   Temp    Pulse 78   Resp 12   SpO2 99%     Last Pain:  Vitals:   10/18/20 0639  TempSrc: Oral  PainSc: 0-No pain         Complications: No notable events documented.

## 2020-10-18 NOTE — Brief Op Note (Signed)
10/18/2020  8:17 AM  PATIENT:  Cathe Mons  60 y.o. female  PRE-OPERATIVE DIAGNOSIS:  OSTEOMYELITIS RIGHT THIRD TOE  POST-OPERATIVE DIAGNOSIS:  OSTEOMYELITIS RIGHT THIRD TOE  PROCEDURE:  Procedure(s): AMPUTATION RIGHT THIRD TOE (Right)  SURGEON:  Surgeon(s) and Role:    * Tyson Babinski, DPM - Primary  PHYSICIAN ASSISTANT:   ASSISTANTS: none   ANESTHESIA:   local and MAC  EBL:  0 mL   BLOOD ADMINISTERED:none  DRAINS: none   LOCAL MEDICATIONS USED:  MARCAINE   , LIDOCAINE , and Amount: 8 ml  SPECIMEN:  Source of Specimen:  Right third toe, right third clean margin  DISPOSITION OF SPECIMEN:  PATHOLOGY  COUNTS:  YES  TOURNIQUET:   Total Tourniquet Time Documented: Ankle (Right) - 22 minutes Total: Ankle (Right) - 22 minutes   DICTATION: .Viviann Spare Dictation  PLAN OF CARE: Discharge to home after PACU  PATIENT DISPOSITION:  PACU - hemodynamically stable.   Delay start of Pharmacological VTE agent (>24hrs) due to surgical blood loss or risk of bleeding: not applicable

## 2020-10-18 NOTE — Anesthesia Procedure Notes (Signed)
Procedure Name: LMA Insertion Date/Time: 10/18/2020 7:36 AM Performed by: Lorin Glass, CRNA Pre-anesthesia Checklist: Patient identified, Emergency Drugs available, Suction available and Patient being monitored Patient Re-evaluated:Patient Re-evaluated prior to induction Oxygen Delivery Method: Circle system utilized Preoxygenation: Pre-oxygenation with 100% oxygen Induction Type: IV induction LMA: LMA inserted LMA Size: 4.0 Tube secured with: Tape Dental Injury: Teeth and Oropharynx as per pre-operative assessment

## 2020-10-18 NOTE — Op Note (Signed)
10/18/2020  8:17 AM  PATIENT:  Christina Lamb  60 y.o. female  PRE-OPERATIVE DIAGNOSIS:  OSTEOMYELITIS RIGHT THIRD TOE  POST-OPERATIVE DIAGNOSIS:  OSTEOMYELITIS RIGHT THIRD TOE  PROCEDURE:  Procedure(s): AMPUTATION RIGHT THIRD TOE (Right)  SURGEON:  Surgeon(s) and Role:    * Shawnique Mariotti, Dance movement psychotherapist, DPM - Primary   ASSISTANTS: none   ANESTHESIA:   local and MAC  EBL:  0 mL   LOCAL MEDICATIONS USED:  MARCAINE   , LIDOCAINE , and Amount: 8 ml  SPECIMEN:  Source of Specimen:  Right third toe, right third clean margin  DISPOSITION OF SPECIMEN:  PATHOLOGY   TOURNIQUET:   Total Tourniquet Time Documented: Ankle (Right) - 22 minutes Total: Ankle (Right) - 22 minutes  MATERIALS: 3-0 Prolene.   Patient was brought into the operating room laid supine on the operating table. Ankle tourniquet was applied to the surgical extremity. Following IV sedation, a local block was achieved using 8 cc of mixture of 1% plain lidocaine with 0.5% marcaine. The foot was the prepped, scrubbed and draped in aseptic manner. Using an esmarch band the tourniquet on the surgical site was inflatted at 266mHG.    Attention was directed towards the right third toe. Elliptical incision was plan to amputate the right third toe. An incision was made using #15 blade around the PIPJ joint of the right third toe. The toe was disarticulated first at the PIPJ and distal portion of the toe was removed and set for pathology. The proximal phalanx was then disarticulated at the MPJ and removed and send to pathology as clean margin. Deep wound cultures were obtained at this time. Wound was irrigated with copious amount of saline. The remaining tissue was healthy and viable. No necrotic tissue noted. No purulent drainage noted. The skin was closed using 3-0 Prolene. Dry sterile dressing applied. The tourniquet was deflated. Capillary refill time was brisk to all lesser toes.  Patient was transferred to PACU with Vital signs  stable. Patient will be placed in surgical shoe and follow up with me at the office.

## 2020-10-18 NOTE — Anesthesia Preprocedure Evaluation (Addendum)
Anesthesia Evaluation  Patient identified by MRN, date of birth, ID band Patient awake    Reviewed: Allergy & Precautions, H&P , NPO status , Patient's Chart, lab work & pertinent test results, reviewed documented beta blocker date and time   Airway Mallampati: II  TM Distance: >3 FB Neck ROM: full    Dental no notable dental hx.    Pulmonary neg pulmonary ROS, former smoker,    Pulmonary exam normal breath sounds clear to auscultation       Cardiovascular Exercise Tolerance: Good hypertension, + Peripheral Vascular Disease   Rhythm:regular Rate:Normal     Neuro/Psych negative neurological ROS  negative psych ROS   GI/Hepatic negative GI ROS, Neg liver ROS,   Endo/Other  negative endocrine ROSdiabetes  Renal/GU negative Renal ROS  negative genitourinary   Musculoskeletal   Abdominal   Peds  Hematology negative hematology ROS (+)   Anesthesia Other Findings   Reproductive/Obstetrics negative OB ROS                             Anesthesia Physical Anesthesia Plan  ASA: 3  Anesthesia Plan: General and General LMA   Post-op Pain Management:    Induction:   PONV Risk Score and Plan: Ondansetron  Airway Management Planned:   Additional Equipment:   Intra-op Plan:   Post-operative Plan:   Informed Consent: I have reviewed the patients History and Physical, chart, labs and discussed the procedure including the risks, benefits and alternatives for the proposed anesthesia with the patient or authorized representative who has indicated his/her understanding and acceptance.     Dental Advisory Given  Plan Discussed with: CRNA  Anesthesia Plan Comments:        Anesthesia Quick Evaluation

## 2020-10-18 NOTE — Anesthesia Postprocedure Evaluation (Signed)
Anesthesia Post Note  Patient: Christina Lamb  Procedure(s) Performed: AMPUTATION RIGHT THIRD TOE (Right: Toe)  Patient location during evaluation: Phase II Anesthesia Type: General Level of consciousness: awake Pain management: pain level controlled Vital Signs Assessment: post-procedure vital signs reviewed and stable Respiratory status: spontaneous breathing and respiratory function stable Cardiovascular status: blood pressure returned to baseline and stable Postop Assessment: no headache and no apparent nausea or vomiting Anesthetic complications: no Comments: Late entry   No notable events documented.   Last Vitals:  Vitals:   10/18/20 0845 10/18/20 0855  BP: 117/73 121/67  Pulse: 74 82  Resp: 15 18  Temp:  36.6 C  SpO2: 96% 98%    Last Pain:  Vitals:   10/18/20 0855  TempSrc: Oral  PainSc: 0-No pain                 Windell Norfolk

## 2020-10-18 NOTE — H&P (Signed)
.  HISTORY AND PHYSICAL INTERVAL NOTE:  10/18/2020  7:14 AM  Christina Lamb  has presented today for surgery, with the diagnosis of OSTEOMYELITIS M86.9.  The various methods of treatment have been discussed with the patient.  No guarantees were given.  After consideration of risks, benefits and other options for treatment, the patient has consented to surgery.  I have reviewed the patients' chart and labs.    Patient Vitals for the past 24 hrs:  BP Temp Temp src Pulse Resp SpO2 Height Weight  10/18/20 0639 (!) 96/49 98.4 F (36.9 C) Oral 76 18 98 % 5\' 6"  (1.676 m) 96.6 kg    A history and physical examination was performed in my office.  The patient was reexamined.  There have been no changes to this history and physical examination.  , DPM

## 2020-10-19 ENCOUNTER — Encounter (HOSPITAL_COMMUNITY): Payer: Self-pay | Admitting: Podiatry

## 2020-10-20 LAB — SURGICAL PATHOLOGY

## 2020-10-23 LAB — AEROBIC/ANAEROBIC CULTURE W GRAM STAIN (SURGICAL/DEEP WOUND): Culture: NORMAL

## 2021-01-15 ENCOUNTER — Other Ambulatory Visit: Payer: Self-pay | Admitting: Internal Medicine

## 2021-01-15 DIAGNOSIS — Z139 Encounter for screening, unspecified: Secondary | ICD-10-CM

## 2021-01-16 ENCOUNTER — Ambulatory Visit
Admission: RE | Admit: 2021-01-16 | Discharge: 2021-01-16 | Disposition: A | Discharge status: Home / Self Care | Payer: No Typology Code available for payment source | Source: Ambulatory Visit | Attending: Internal Medicine | Admitting: Internal Medicine

## 2021-01-16 ENCOUNTER — Other Ambulatory Visit: Payer: Self-pay

## 2021-01-16 DIAGNOSIS — Z139 Encounter for screening, unspecified: Secondary | ICD-10-CM

## 2021-01-16 IMAGING — MG MM DIGITAL SCREENING BILAT W/ TOMO AND CAD
6 of 10 series · 6 of 30 positions shown · non-contrast
Comparison: Previous exam(s).

CLINICAL DATA: Screening.

EXAM:
DIGITAL SCREENING BILATERAL MAMMOGRAM WITH TOMOSYNTHESIS AND CAD
TECHNIQUE: Bilateral screening digital craniocaudal and mediolateral oblique
mammograms were obtained. Bilateral screening digital breast
tomosynthesis was performed. The images were evaluated with
computer-aided detection.

[L MLO synth-2D]
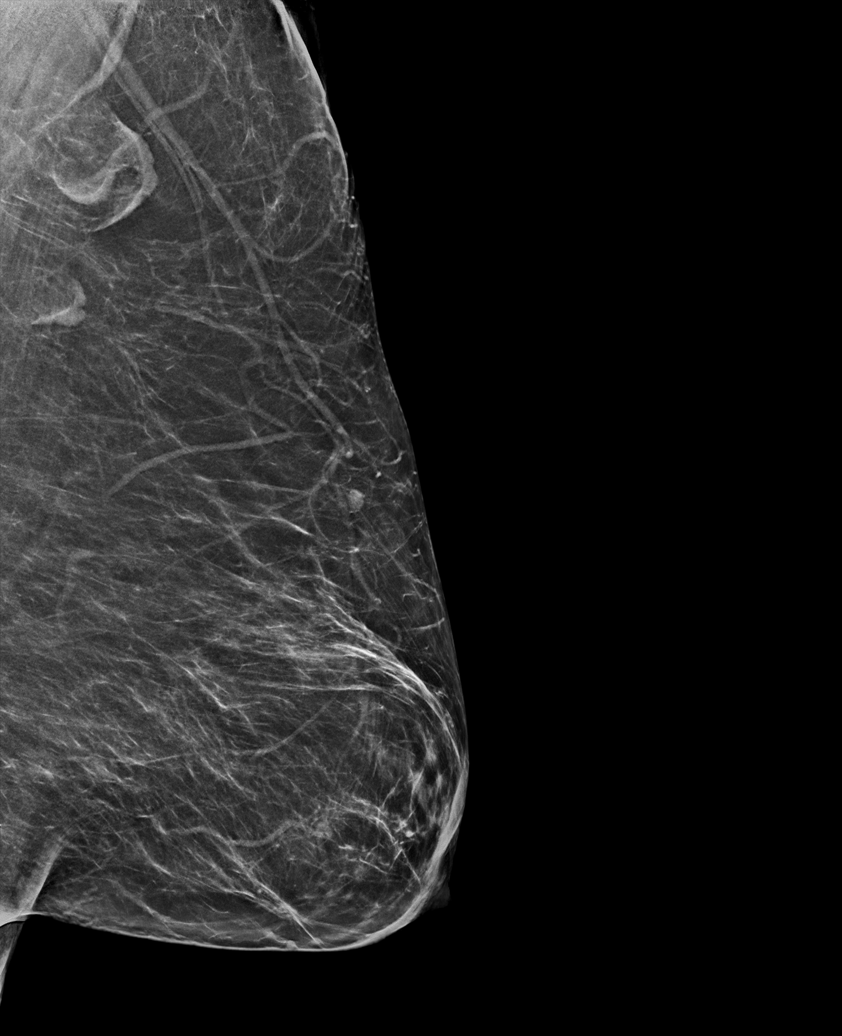

[R ML synth-2D]
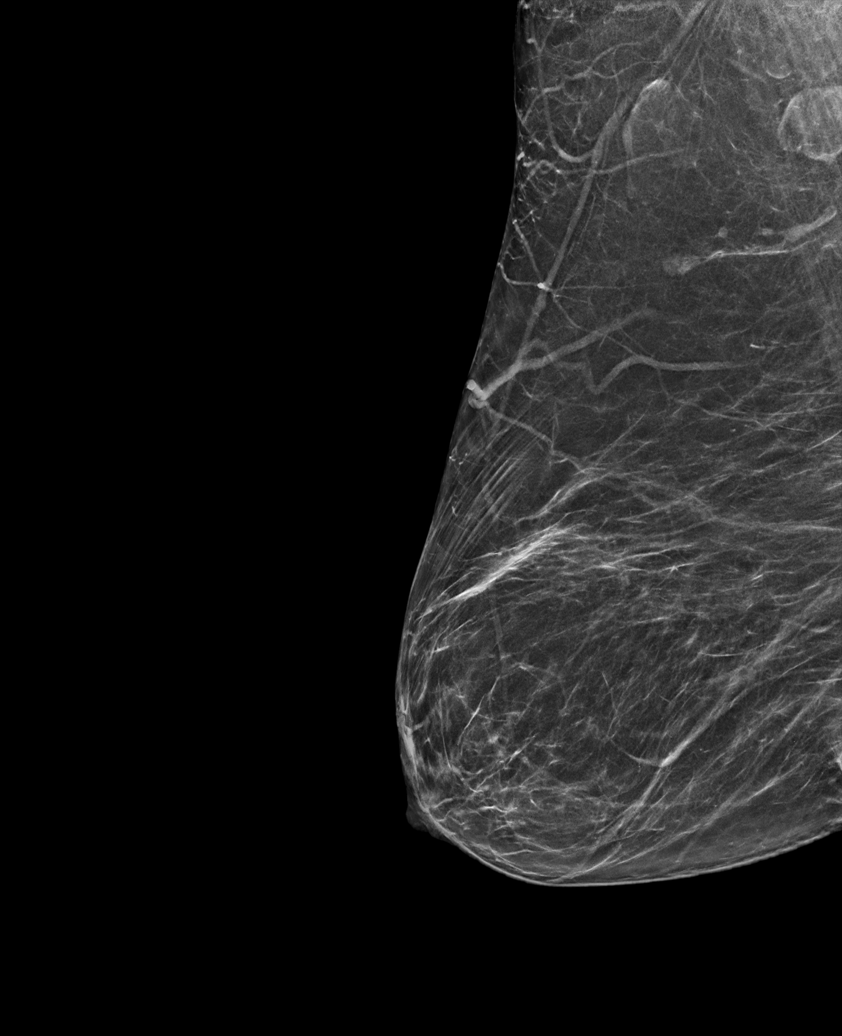

[R MLO synth-2D]
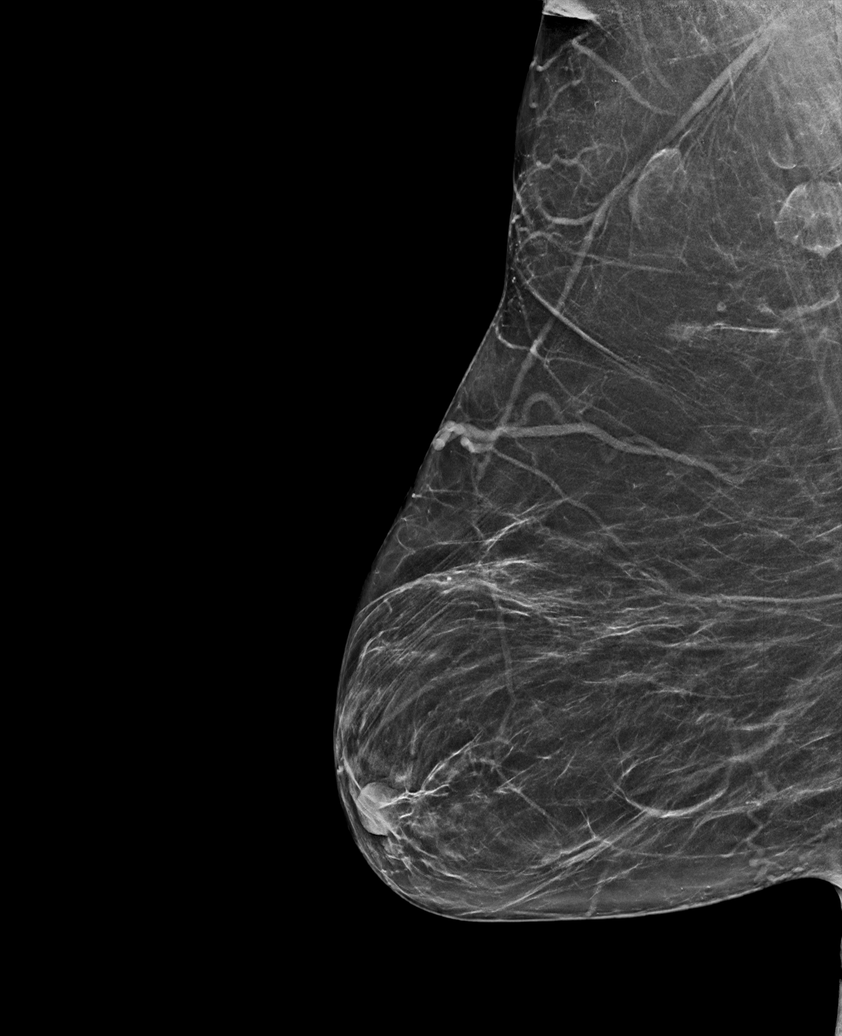

[L CC synth-2D]
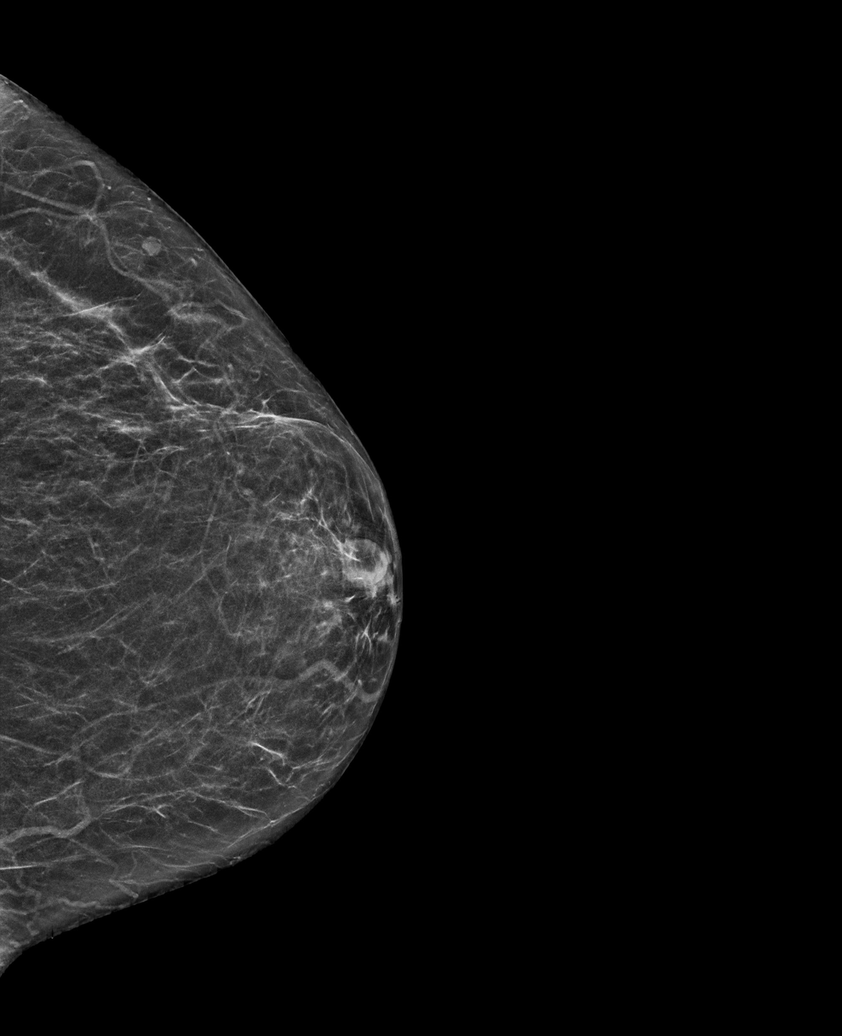

[R CC synth-2D]
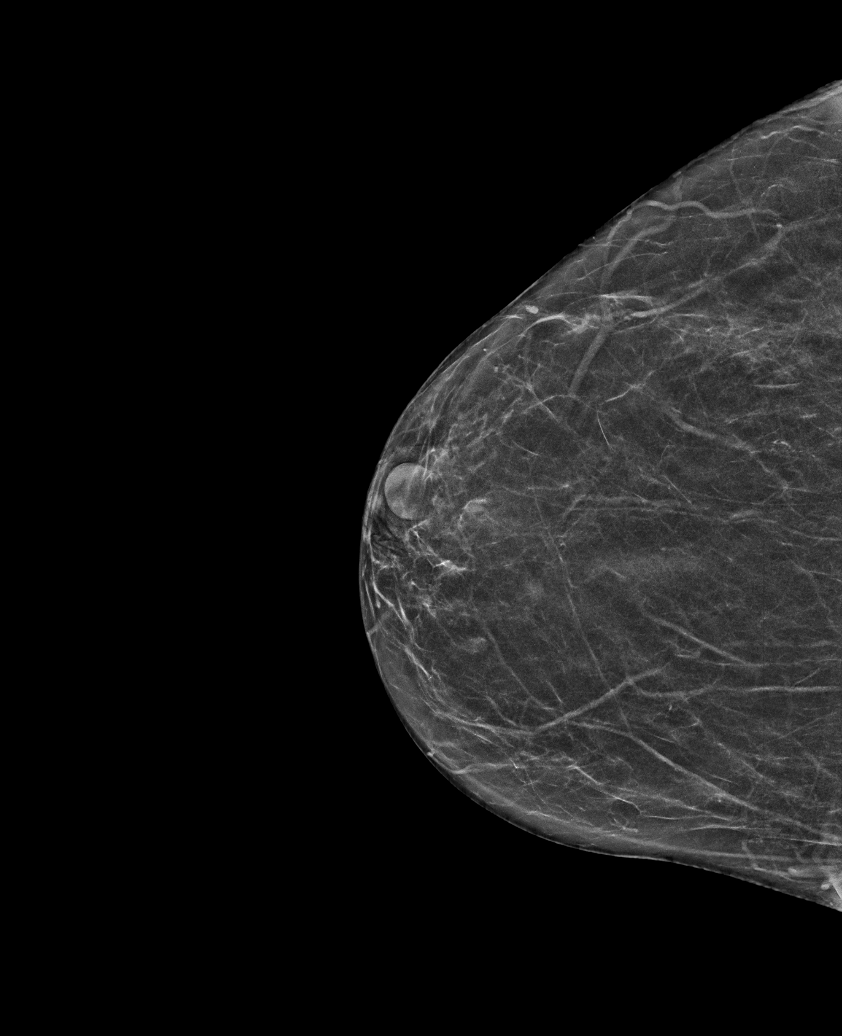

[L CC tomo · tomo slice 29/56.0]
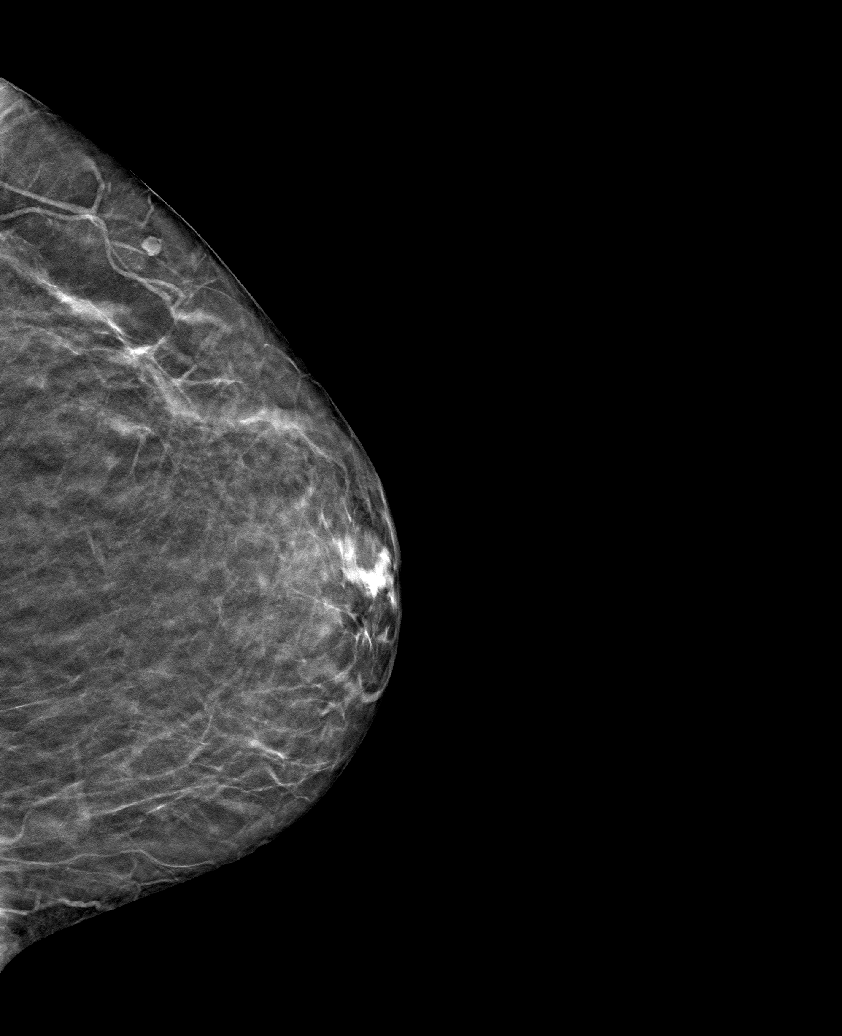

[6 of 30 positions shown; findings below may reference images not displayed]

ACR Breast Density Category b: There are scattered areas of
fibroglandular density.
FINDINGS: There are no findings suspicious for malignancy.
IMPRESSION: No mammographic evidence of malignancy. A result letter of this
screening mammogram will be mailed directly to the patient.

RECOMMENDATION:
Screening mammogram in one year. (Code:[BY])

BI-RADS CATEGORY  1: Negative.

## 2021-03-12 ENCOUNTER — Other Ambulatory Visit: Payer: Self-pay

## 2021-05-28 ENCOUNTER — Other Ambulatory Visit (HOSPITAL_COMMUNITY): Payer: Self-pay | Admitting: Podiatry

## 2021-05-28 ENCOUNTER — Other Ambulatory Visit: Payer: Self-pay | Admitting: Podiatry

## 2021-05-28 DIAGNOSIS — M86172 Other acute osteomyelitis, left ankle and foot: Secondary | ICD-10-CM

## 2021-05-28 DIAGNOSIS — I739 Peripheral vascular disease, unspecified: Secondary | ICD-10-CM

## 2021-05-31 ENCOUNTER — Other Ambulatory Visit: Payer: Self-pay

## 2021-05-31 ENCOUNTER — Ambulatory Visit (HOSPITAL_COMMUNITY)
Admission: RE | Admit: 2021-05-31 | Discharge: 2021-05-31 | Disposition: A | Discharge status: Home / Self Care | Payer: No Typology Code available for payment source | Source: Ambulatory Visit | Attending: Podiatry | Admitting: Podiatry

## 2021-05-31 DIAGNOSIS — I739 Peripheral vascular disease, unspecified: Secondary | ICD-10-CM | POA: Diagnosis present

## 2021-05-31 DIAGNOSIS — M86172 Other acute osteomyelitis, left ankle and foot: Secondary | ICD-10-CM

## 2021-05-31 IMAGING — MR MR FOOT*L* W/O CM
5 series · 39 of 40 positions shown · non-contrast
Comparison: None.

CLINICAL DATA: Technologist note reports second toe discolored with
ulcer.

EXAM:
MRI OF THE LEFT FOOT WITHOUT CONTRAST
TECHNIQUE: Multiplanar, multisequence MR imaging of the left foot was
performed. No intravenous contrast was administered.

[Series 3: T1 · axial · left · 3.0mm · 0.70mm/px · z∈[-97,-40]mm · 4 of 16 slices shown (1 of 2)]
[im 1/16]
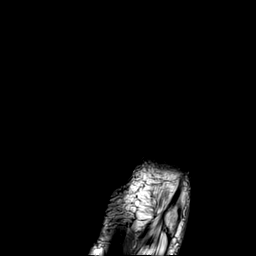
[im 6/16]
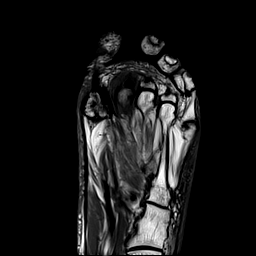
[im 11/16]
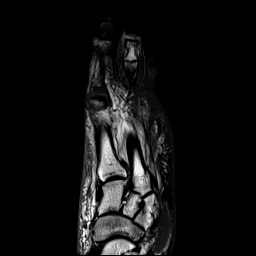
[im 16/16]
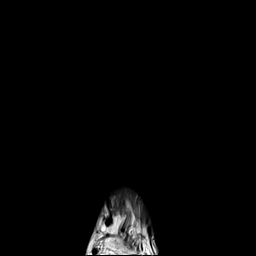

[Series 4: T2 · axial · left · 3.0mm · 0.70mm/px · z∈[-97,-40]mm · 5 of 16 slices shown (1 of 2)]
[im 1/16]
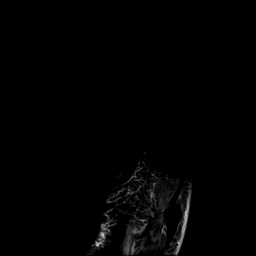
[im 4/16]
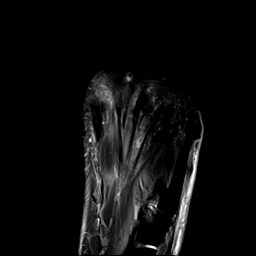
[im 8/16]
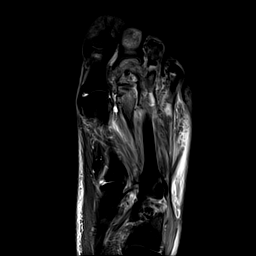
[im 12/16]
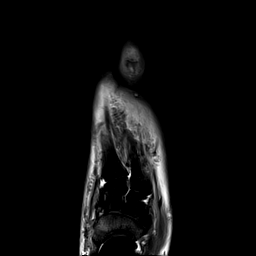
[im 16/16]
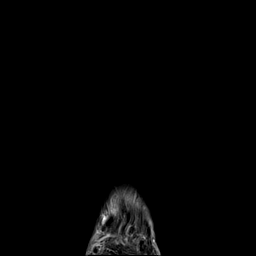

[Series 5: T1 · coronal · left · 3.0mm · 0.38mm/px · 11 of 34 slices shown (2 of 2)]
[im 1/34]
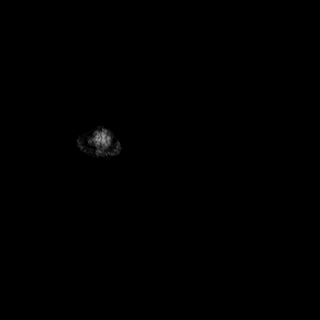
[im 4/34]
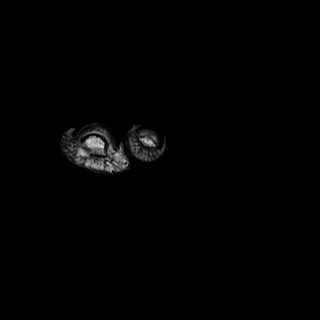
[im 7/34]
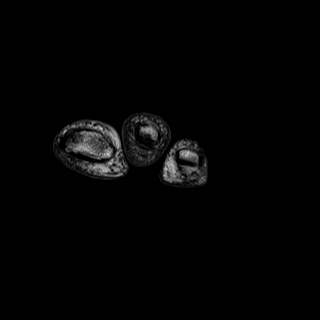
[im 10/34]
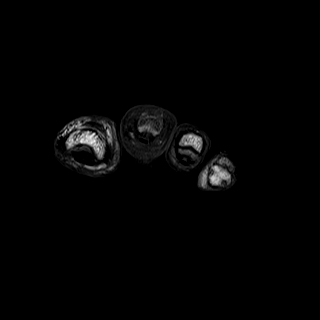
[im 14/34]
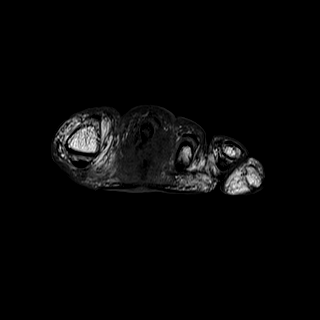
[im 17/34]
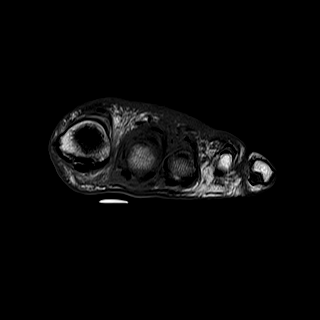
[im 20/34]
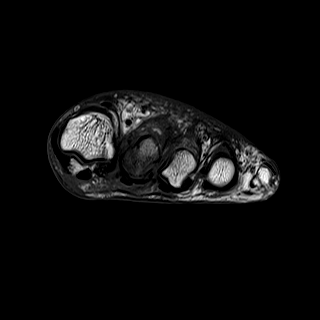
[im 24/34]
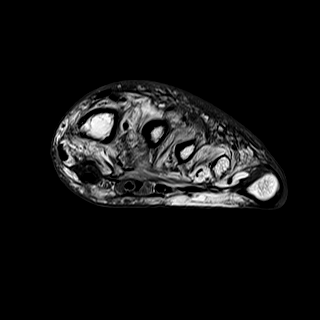
[im 27/34]
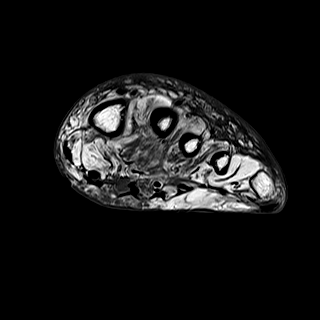
[im 30/34]
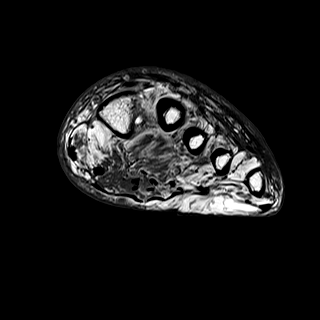
[im 34/34]
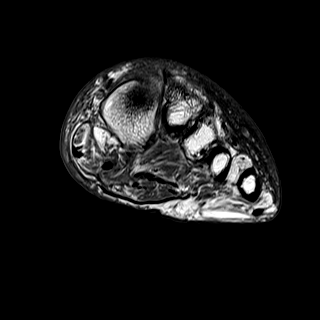

[Series 6: T2 · coronal · left · 3.0mm · 0.47mm/px · 11 of 34 slices shown (2 of 2)]
[im 1/34]
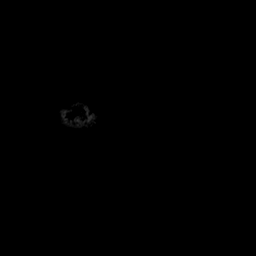
[im 4/34]
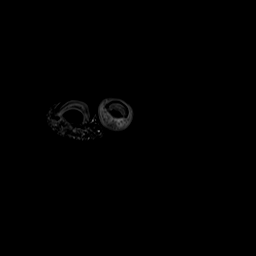
[im 7/34]
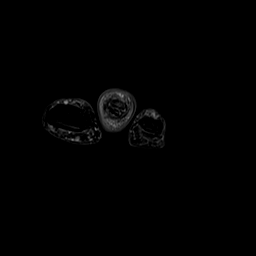
[im 10/34]
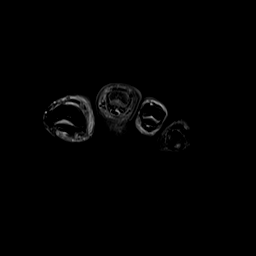
[im 14/34]
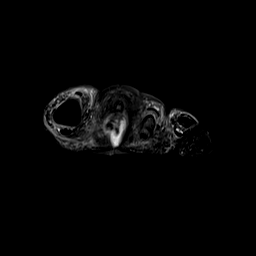
[im 17/34]
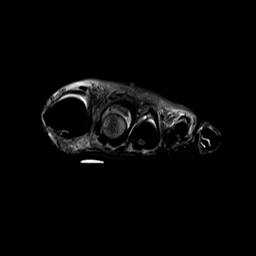
[im 20/34]
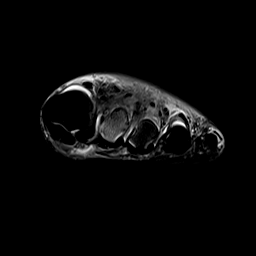
[im 24/34]
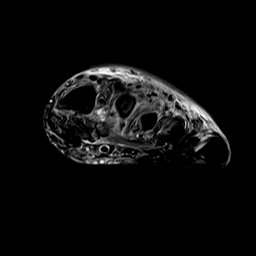
[im 27/34]
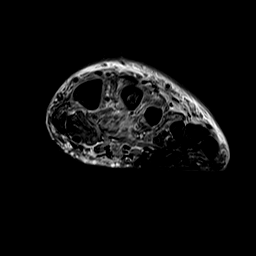
[im 30/34]
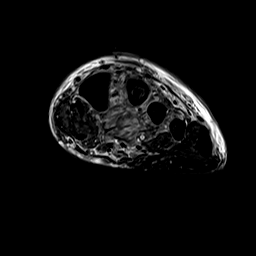
[im 34/34]
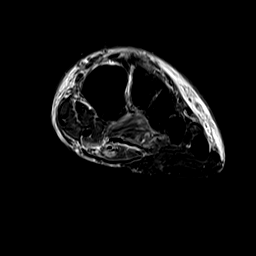

[Series 7: STIR · sagittal · left · 3.0mm · 0.35mm/px · 8 of 27 slices shown]
[im 1/27]
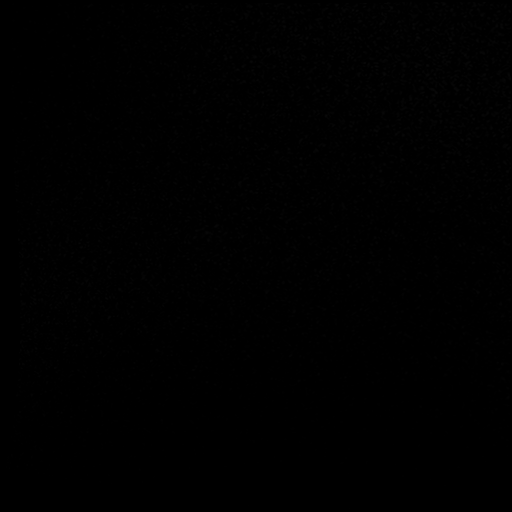
[im 4/27]
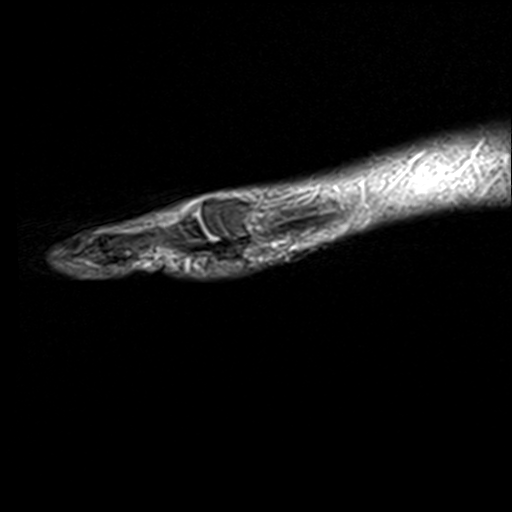
[im 7/27]
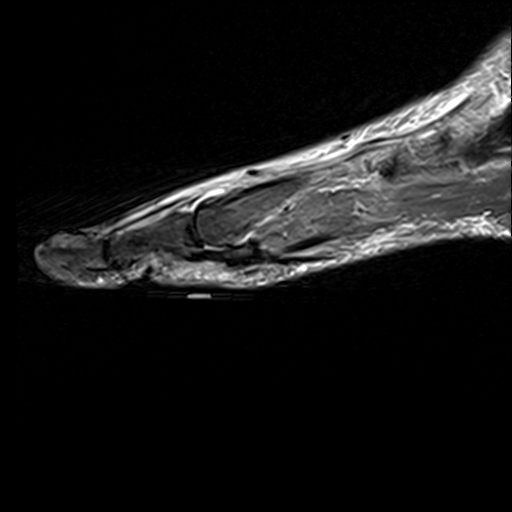
[im 10/27]
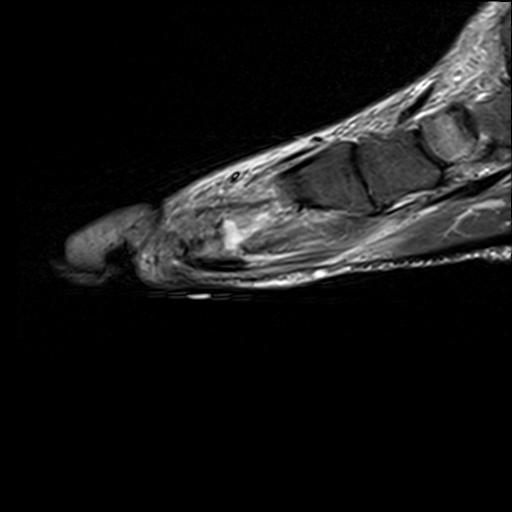
[im 17/27]
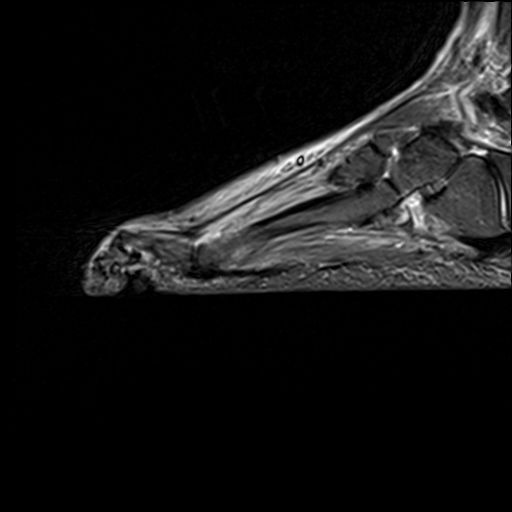
[im 20/27]
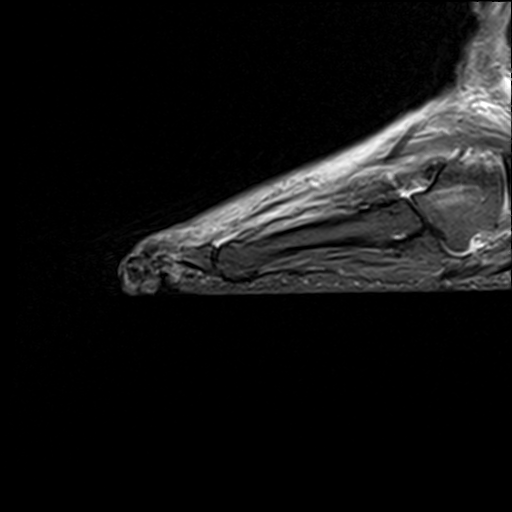
[im 23/27]
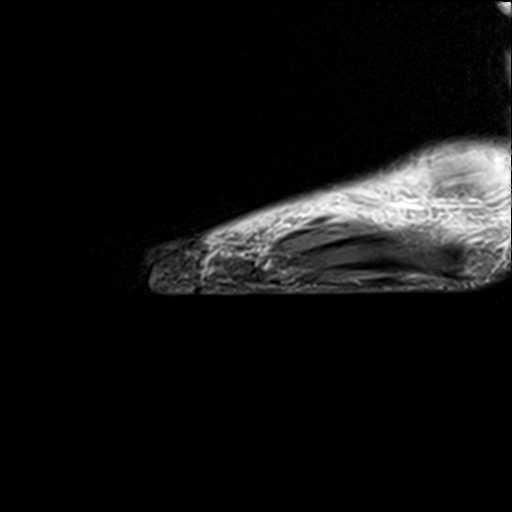
[im 27/27]
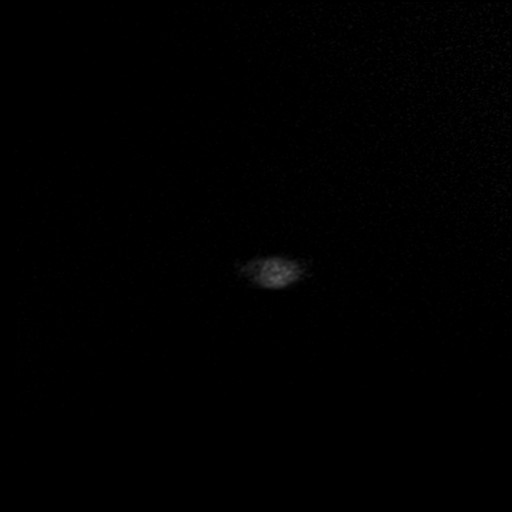

[39 of 40 positions shown; findings below may reference images not displayed]

FINDINGS: Bones/Joint/Cartilage

No fracture or dislocation. Normal alignment.

Soft tissue wound along the plantar aspect of the second MTP joint
extending to the joint space. Small second MTP joint effusion. Bone
marrow edema in the second metatarsal head/neck as well as the
second proximal phalanx and second middle phalanx. Bone marrow edema
throughout the navicular concerning for stress reaction.

Ligaments

Collateral ligaments are intact.  Lisfranc ligament is intact.

Muscles and Tendons

Flexor, peroneal and extensor compartment tendons are intact. Mild
T2 hyperintensity within the plantar musculature likely neurogenic.

Soft tissue
No fluid collection or hematoma. No soft tissue mass. Soft tissue
edema along the dorsal aspect of the foot.
IMPRESSION: 1. Soft tissue wound along the plantar aspect of the second MTP
joint. Second MTP joint effusion with bone marrow edema on either
side of the joint most consistent with septic arthritis and
osteomyelitis. Osteomyelitis of the second middle phalanx.
2. Bone marrow edema throughout the navicular concerning for stress
reaction.

## 2021-06-05 NOTE — Patient Instructions (Signed)
Christina Lamb  06/05/2021     @PREFPERIOPPHARMACY @   Your procedure is scheduled on  06/13/2021.   Report to 08/11/2021 at  [phone]  A.M.   Call this number if you have problems the morning of surgery:  [phone]   Remember:  Do not eat or drink after midnight.      DO NOT take any medications for diabetes the morning of your procedure.    Take these medicines the morning of surgery with A SIP OF WATER                                           Tramadol(if needed).     Do not wear jewelry, make-up or nail polish.  Do not wear lotions, powders, or perfumes, or deodorant.  Do not shave 48 hours prior to surgery.  Men may shave face and neck.  Do not bring valuables to the hospital.  [facility] is not responsible for any belongings or valuables.  Contacts, dentures or bridgework may not be worn into surgery.  Leave your suitcase in the car.  After surgery it may be brought to your room.  For patients admitted to the hospital, discharge time will be determined by your treatment team.  Patients discharged the day of surgery will not be allowed to drive home and must have someone with them for 24 hours.    Special instructions:    DO NOT smoke tobacco or vape for 24 hours before your procedure.  Please read over the following fact sheets that you were given. Coughing and Deep Breathing, Surgical Site Infection Prevention, Anesthesia Post-op Instructions, and Care and Recovery After Surgery      Ostectomy of the Foot, Care After This sheet gives you information about how to care for yourself after your procedure. Your health care provider may also give you more specific instructions. If you have problems or questions, contact your health care provider. What can I expect after the procedure? After the procedure, it is common to have: Redness. Pain. Swelling. Stiffness. Follow these instructions at home: If you have a splint or supportive shoe: Wear the  splint or shoe as told by your health care provider. Remove it only as told by your health care provider. Loosen it if your toes tingle, become numb, or turn cold and blue. Keep it clean and dry. If you have a cast: Do not stick anything inside the cast to scratch your skin. Doing that increases your risk of infection. Check the skin around the cast every day. Tell your health care provider about any concerns. You may put lotion on dry skin around the edges of the cast. Do not put lotion on the skin underneath the cast. Keep it clean and dry. Bathing Do not take baths, swim, or use a hot tub until your health care provider approves. Ask your health care provider if you may take showers. If your cast, splint, or shoe is not waterproof: Do not let it get wet. Cover it with a watertight covering when you take a bath or a shower. Keep the bandage (dressing) dry until your health care provider says it can be removed. Incision care  Follow instructions from your health care provider about how to take care of your incision. Make sure you: Wash your hands with soap and water for  at least 20 seconds before and after you change your dressing. If soap and water are not available, use hand sanitizer. Change your dressing as told by your health care provider. Leave stitches (sutures), skin glue, or adhesive strips in place. These skin closures may need to stay in place for 2 weeks or longer. If adhesive strip edges start to loosen and curl up, you may trim the loose edges. Do not remove adhesive strips completely unless your health care provider tells you to do that. Check your incision area every day for signs of infection. Check for: More redness, swelling, or pain. Fluid or blood. Warmth. Pus or a bad smell. Managing pain, stiffness, and swelling  If directed, put ice on the affected area. To do this: If you have a removable splint or shoe, remove it as told by your health care provider. Put ice  in a plastic bag. Place a towel between your skin and the bag or between your cast and the bag. Leave the ice on for 20 minutes, 2-3 times a day. Remove the ice if your skin turns bright red. This is very important. If you cannot feel pain, heat, or cold, you have a greater risk of damage to the area. Move your foot and toes often to reduce stiffness and swelling. Raise (elevate) the affected area above the level of your heart while you are sitting or lying down. Driving Ask your health care provider if the medicine prescribed to you requires you to avoid driving or using machinery. Ask your health care provider when it is safe to drive if you have a cast, splint, or supportive shoe on your foot. Activity Return to your normal activities as told by your health care provider. Ask your health care provider what activities are safe for you. Do exercises as told by your health care provider. Safety Do not use your foot to support your body weight until your health care provider says that you can. Use your crutches or walker as told by your health care provider. General instructions Do not put pressure on any part of the cast until it is fully hardened. This may take several hours. Do not use any products that contain nicotine or tobacco, such as cigarettes, e-cigarettes, or chewing tobacco. These can delay bone healing. If you need help quitting, ask your health care provider. Take over-the-counter and prescription medicines only as told by your health care provider. Keep all follow-up visits. This is important. Contact a health care provider if: You have chills or a fever. Your pain is not getting better even after taking your pain medicine. You have more redness, swelling, or pain around your incision. You have fluid or blood coming from your incision. Your incision feels warm to the touch. You have pus or a bad smell coming from your incision. Get help right away if: You have severe  pain. You have new pain, warmth, and swelling in your leg. You have chest pain or difficulty breathing. These symptoms may represent a serious problem that is an emergency. Do not wait to see if the symptoms will go away. Get medical help right away. Call your local emergency services (911 in the [place] Do not drive yourself to the hospital. Summary After the procedure, it is common to have some pain, swelling, and stiffness. Follow instructions from your health care provider about how to take care of your incision. Check the incision area every day for signs of infection. Do not use your foot to support  your body weight until your health care provider says that you can. Move your foot and toes often to reduce stiffness and swelling. This information is not intended to replace advice given to you by your health care provider. Make sure you discuss any questions you have with your health care provider. Document Revised: 08/17/2019 Document Reviewed: 08/17/2019 Elsevier Patient Education  2022 [employer] Monitored Anesthesia Care, Care After This sheet gives you information about how to care for yourself after your procedure. Your health care provider may also give you more specific instructions. If you have problems or questions, contact your health care provider. What can I expect after the procedure? After the procedure, it is common to have: Tiredness. Forgetfulness about what happened after the procedure. Impaired judgment for important decisions. Nausea or vomiting. Some difficulty with balance. Follow these instructions at home: For the time period you were told by your health care provider:   Rest as needed. Do not participate in activities where you could fall or become injured. Do not drive or use machinery. Do not drink alcohol. Do not take sleeping pills or medicines that cause drowsiness. Do not make important decisions or sign legal documents. Do not take care of  children on your own. Eating and drinking Follow the diet that is recommended by your health care provider. Drink enough fluid to keep your urine pale yellow. If you vomit: Drink water, juice, or soup when you can drink without vomiting. Make sure you have little or no nausea before eating solid foods. General instructions Have a responsible adult stay with you for the time you are told. It is important to have someone help care for you until you are awake and alert. Take over-the-counter and prescription medicines only as told by your health care provider. If you have sleep apnea, surgery and certain medicines can increase your risk for breathing problems. Follow instructions from your health care provider about wearing your sleep device: Anytime you are sleeping, including during daytime naps. While taking prescription pain medicines, sleeping medicines, or medicines that make you drowsy. Avoid smoking. Keep all follow-up visits as told by your health care provider. This is important. Contact a health care provider if: You keep feeling nauseous or you keep vomiting. You feel light-headed. You are still sleepy or having trouble with balance after 24 hours. You develop a rash. You have a fever. You have redness or swelling around the IV site. Get help right away if: You have trouble breathing. You have new-onset confusion at home. Summary For several hours after your procedure, you may feel tired. You may also be forgetful and have poor judgment. Have a responsible adult stay with you for the time you are told. It is important to have someone help care for you until you are awake and alert. Rest as told. Do not drive or operate machinery. Do not drink alcohol or take sleeping pills. Get help right away if you have trouble breathing, or if you suddenly become confused. This information is not intended to replace advice given to you by your health care provider. Make sure you discuss any  questions you have with your health care provider. Document Revised: 01/06/2020 Document Reviewed: 03/25/2019 Elsevier Patient Education  2022 [employer] How to Use Chlorhexidine for Bathing Chlorhexidine gluconate (CHG) is a germ-killing (antiseptic) solution that is used to clean the skin. It can get rid of the bacteria that normally live on the skin and can keep them away for about 24 hours.  To clean your skin with CHG, you may be given: A CHG solution to use in the shower or as part of a sponge bath. A prepackaged cloth that contains CHG. Cleaning your skin with CHG may help lower the risk for infection: While you are staying in the intensive care unit of the hospital. If you have a vascular access, such as a central line, to provide short-term or long-term access to your veins. If you have a catheter to drain urine from your bladder. If you are on a ventilator. A ventilator is a machine that helps you breathe by moving air in and out of your lungs. After surgery. What are the risks? Risks of using CHG include: A skin reaction. Hearing loss, if CHG gets in your ears and you have a perforated eardrum. Eye injury, if CHG gets in your eyes and is not rinsed out. The CHG product catching fire. Make sure that you avoid smoking and flames after applying CHG to your skin. Do not use CHG: If you have a chlorhexidine allergy or have previously reacted to chlorhexidine. On babies younger than 322 months of age. How to use CHG solution Use CHG only as told by your health care provider, and follow the instructions on the label. Use the full amount of CHG as directed. Usually, this is one bottle. During a shower Follow these steps when using CHG solution during a shower (unless your health care provider gives you different instructions): Start the shower. Use your normal soap and shampoo to wash your face and hair. Turn off the shower or move out of the shower stream. Pour the CHG onto a  clean washcloth. Do not use any type of brush or rough-edged sponge. Starting at your neck, lather your body down to your toes. Make sure you follow these instructions: If you will be having surgery, pay special attention to the part of your body where you will be having surgery. Scrub this area for at least 1 minute. Do not use CHG on your head or face. If the solution gets into your ears or eyes, rinse them well with water. Avoid your genital area. Avoid any areas of skin that have broken skin, cuts, or scrapes. Scrub your back and under your arms. Make sure to wash skin folds. Let the lather sit on your skin for 1-2 minutes or as long as told by your health care provider. Thoroughly rinse your entire body in the shower. Make sure that all body creases and crevices are rinsed well. Dry off with a clean towel. Do not put any substances on your body afterward--such as powder, lotion, or perfume--unless you are told to do so by your health care provider. Only use lotions that are recommended by the manufacturer. Put on clean clothes or pajamas. If it is the night before your surgery, sleep in clean sheets.  During a sponge bath Follow these steps when using CHG solution during a sponge bath (unless your health care provider gives you different instructions): Use your normal soap and shampoo to wash your face and hair. Pour the CHG onto a clean washcloth. Starting at your neck, lather your body down to your toes. Make sure you follow these instructions: If you will be having surgery, pay special attention to the part of your body where you will be having surgery. Scrub this area for at least 1 minute. Do not use CHG on your head or face. If the solution gets into your ears or eyes, rinse them  well with water. Avoid your genital area. Avoid any areas of skin that have broken skin, cuts, or scrapes. Scrub your back and under your arms. Make sure to wash skin folds. Let the lather sit on your skin  for 1-2 minutes or as long as told by your health care provider. Using a different clean, wet washcloth, thoroughly rinse your entire body. Make sure that all body creases and crevices are rinsed well. Dry off with a clean towel. Do not put any substances on your body afterward--such as powder, lotion, or perfume--unless you are told to do so by your health care provider. Only use lotions that are recommended by the manufacturer. Put on clean clothes or pajamas. If it is the night before your surgery, sleep in clean sheets. How to use CHG prepackaged cloths Only use CHG cloths as told by your health care provider, and follow the instructions on the label. Use the CHG cloth on clean, dry skin. Do not use the CHG cloth on your head or face unless your health care provider tells you to. When washing with the CHG cloth: Avoid your genital area. Avoid any areas of skin that have broken skin, cuts, or scrapes. Before surgery Follow these steps when using a CHG cloth to clean before surgery (unless your health care provider gives you different instructions): Using the CHG cloth, vigorously scrub the part of your body where you will be having surgery. Scrub using a back-and-forth motion for 3 minutes. The area on your body should be completely wet with CHG when you are done scrubbing. Do not rinse. Discard the cloth and let the area air-dry. Do not put any substances on the area afterward, such as powder, lotion, or perfume. Put on clean clothes or pajamas. If it is the night before your surgery, sleep in clean sheets.  For general bathing Follow these steps when using CHG cloths for general bathing (unless your health care provider gives you different instructions). Use a separate CHG cloth for each area of your body. Make sure you wash between any folds of skin and between your fingers and toes. Wash your body in the following order, switching to a new cloth after each step: The front of your neck,  shoulders, and chest. Both of your arms, under your arms, and your hands. Your stomach and groin area, avoiding the genitals. Your right leg and foot. Your left leg and foot. The back of your neck, your back, and your buttocks. Do not rinse. Discard the cloth and let the area air-dry. Do not put any substances on your body afterward--such as powder, lotion, or perfume--unless you are told to do so by your health care provider. Only use lotions that are recommended by the manufacturer. Put on clean clothes or pajamas. Contact a health care provider if: Your skin gets irritated after scrubbing. You have questions about using your solution or cloth. You swallow any chlorhexidine. Call your local poison control center ([phone] in the [place] Get help right away if: Your eyes itch badly, or they become very red or swollen. Your skin itches badly and is red or swollen. Your hearing changes. You have trouble seeing. You have swelling or tingling in your mouth or throat. You have trouble breathing. These symptoms may represent a serious problem that is an emergency. Do not wait to see if the symptoms will go away. Get medical help right away. Call your local emergency services (911 in the [place] Do not drive yourself to the hospital.  Summary Chlorhexidine gluconate (CHG) is a germ-killing (antiseptic) solution that is used to clean the skin. Cleaning your skin with CHG may help to lower your risk for infection. You may be given CHG to use for bathing. It may be in a bottle or in a prepackaged cloth to use on your skin. Carefully follow your health care provider's instructions and the instructions on the product label. Do not use CHG if you have a chlorhexidine allergy. Contact your health care provider if your skin gets irritated after scrubbing. This information is not intended to replace advice given to you by your health care provider. Make sure you discuss any questions you have with your  health care provider. Document Revised: 07/03/2020 Document Reviewed: 07/03/2020 Elsevier Patient Education  2022 [employer].

## 2021-06-06 ENCOUNTER — Encounter (HOSPITAL_COMMUNITY): Payer: Self-pay

## 2021-06-06 ENCOUNTER — Other Ambulatory Visit (HOSPITAL_COMMUNITY): Payer: No Typology Code available for payment source

## 2021-06-08 ENCOUNTER — Other Ambulatory Visit: Payer: Self-pay | Admitting: Podiatry

## 2021-06-11 ENCOUNTER — Encounter (HOSPITAL_COMMUNITY)
Admission: RE | Admit: 2021-06-11 | Discharge: 2021-06-11 | Disposition: A | Discharge status: Home / Self Care | Payer: No Typology Code available for payment source | Source: Ambulatory Visit | Attending: Podiatry | Admitting: Podiatry

## 2021-06-11 DIAGNOSIS — Z01818 Encounter for other preprocedural examination: Secondary | ICD-10-CM | POA: Diagnosis present

## 2021-06-11 DIAGNOSIS — E119 Type 2 diabetes mellitus without complications: Secondary | ICD-10-CM | POA: Diagnosis not present

## 2021-06-11 LAB — BASIC METABOLIC PANEL
Anion gap: 12 (ref 5–15)
BUN: 19 mg/dL (ref 6–20)
CO2: 19 mmol/L — ABNORMAL LOW (ref 22–32)
Calcium: 9.1 mg/dL (ref 8.9–10.3)
Chloride: 106 mmol/L (ref 98–111)
Creatinine, Ser: 0.89 mg/dL (ref 0.44–1.00)
GFR, Estimated: >60 mL/min (ref >60)
Glucose, Bld: 155 mg/dL — ABNORMAL HIGH (ref 70–99)
Potassium: 4.3 mmol/L (ref 3.5–5.1)
Sodium: 137 mmol/L (ref 135–145)

## 2021-06-13 ENCOUNTER — Ambulatory Visit (HOSPITAL_COMMUNITY): Payer: No Typology Code available for payment source | Admitting: Certified Registered Nurse Anesthetist

## 2021-06-13 ENCOUNTER — Other Ambulatory Visit: Payer: Self-pay

## 2021-06-13 ENCOUNTER — Encounter (HOSPITAL_COMMUNITY): Payer: Self-pay

## 2021-06-13 ENCOUNTER — Encounter (HOSPITAL_COMMUNITY)
Admission: RE | Disposition: A | Discharge status: Home / Self Care | Payer: Self-pay | Source: Ambulatory Visit | Attending: Podiatry

## 2021-06-13 ENCOUNTER — Ambulatory Visit (HOSPITAL_COMMUNITY)
Admission: RE | Admit: 2021-06-13 | Discharge: 2021-06-13 | Disposition: A | Discharge status: Home / Self Care | Payer: No Typology Code available for payment source | Source: Ambulatory Visit | Attending: Podiatry | Admitting: Podiatry

## 2021-06-13 ENCOUNTER — Ambulatory Visit (HOSPITAL_COMMUNITY): Payer: No Typology Code available for payment source

## 2021-06-13 DIAGNOSIS — Z87891 Personal history of nicotine dependence: Secondary | ICD-10-CM | POA: Diagnosis not present

## 2021-06-13 DIAGNOSIS — E1151 Type 2 diabetes mellitus with diabetic peripheral angiopathy without gangrene: Secondary | ICD-10-CM | POA: Diagnosis not present

## 2021-06-13 DIAGNOSIS — E1169 Type 2 diabetes mellitus with other specified complication: Secondary | ICD-10-CM | POA: Diagnosis not present

## 2021-06-13 DIAGNOSIS — Z9889 Other specified postprocedural states: Secondary | ICD-10-CM

## 2021-06-13 DIAGNOSIS — M869 Osteomyelitis, unspecified: Secondary | ICD-10-CM | POA: Insufficient documentation

## 2021-06-13 DIAGNOSIS — I10 Essential (primary) hypertension: Secondary | ICD-10-CM | POA: Diagnosis not present

## 2021-06-13 DIAGNOSIS — E119 Type 2 diabetes mellitus without complications: Secondary | ICD-10-CM

## 2021-06-13 HISTORY — PX: METATARSAL HEAD EXCISION: SHX5027

## 2021-06-13 LAB — GLUCOSE, CAPILLARY
Glucose-Capillary: 126 mg/dL — ABNORMAL HIGH (ref 70–99)
Glucose-Capillary: 131 mg/dL — ABNORMAL HIGH (ref 70–99)

## 2021-06-13 IMAGING — DX DG FOOT COMPLETE 3+V*L*
3 series · 3 of 3 positions shown · non-contrast
Comparison: [DATE].

CLINICAL DATA: Status post resection of second metatarsal.

EXAM:
LEFT FOOT - COMPLETE 3+ VIEW

[foot ap]
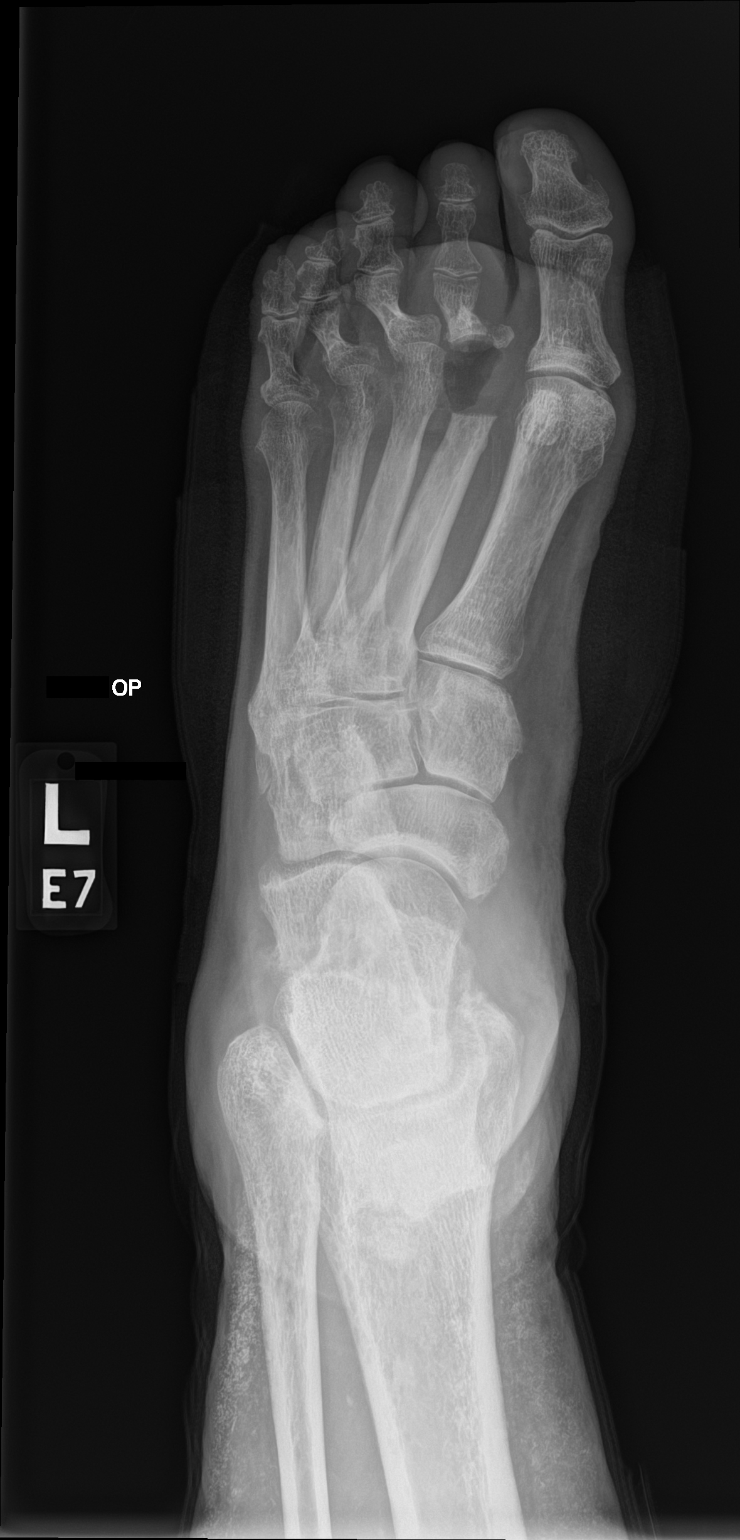

[foot obl]
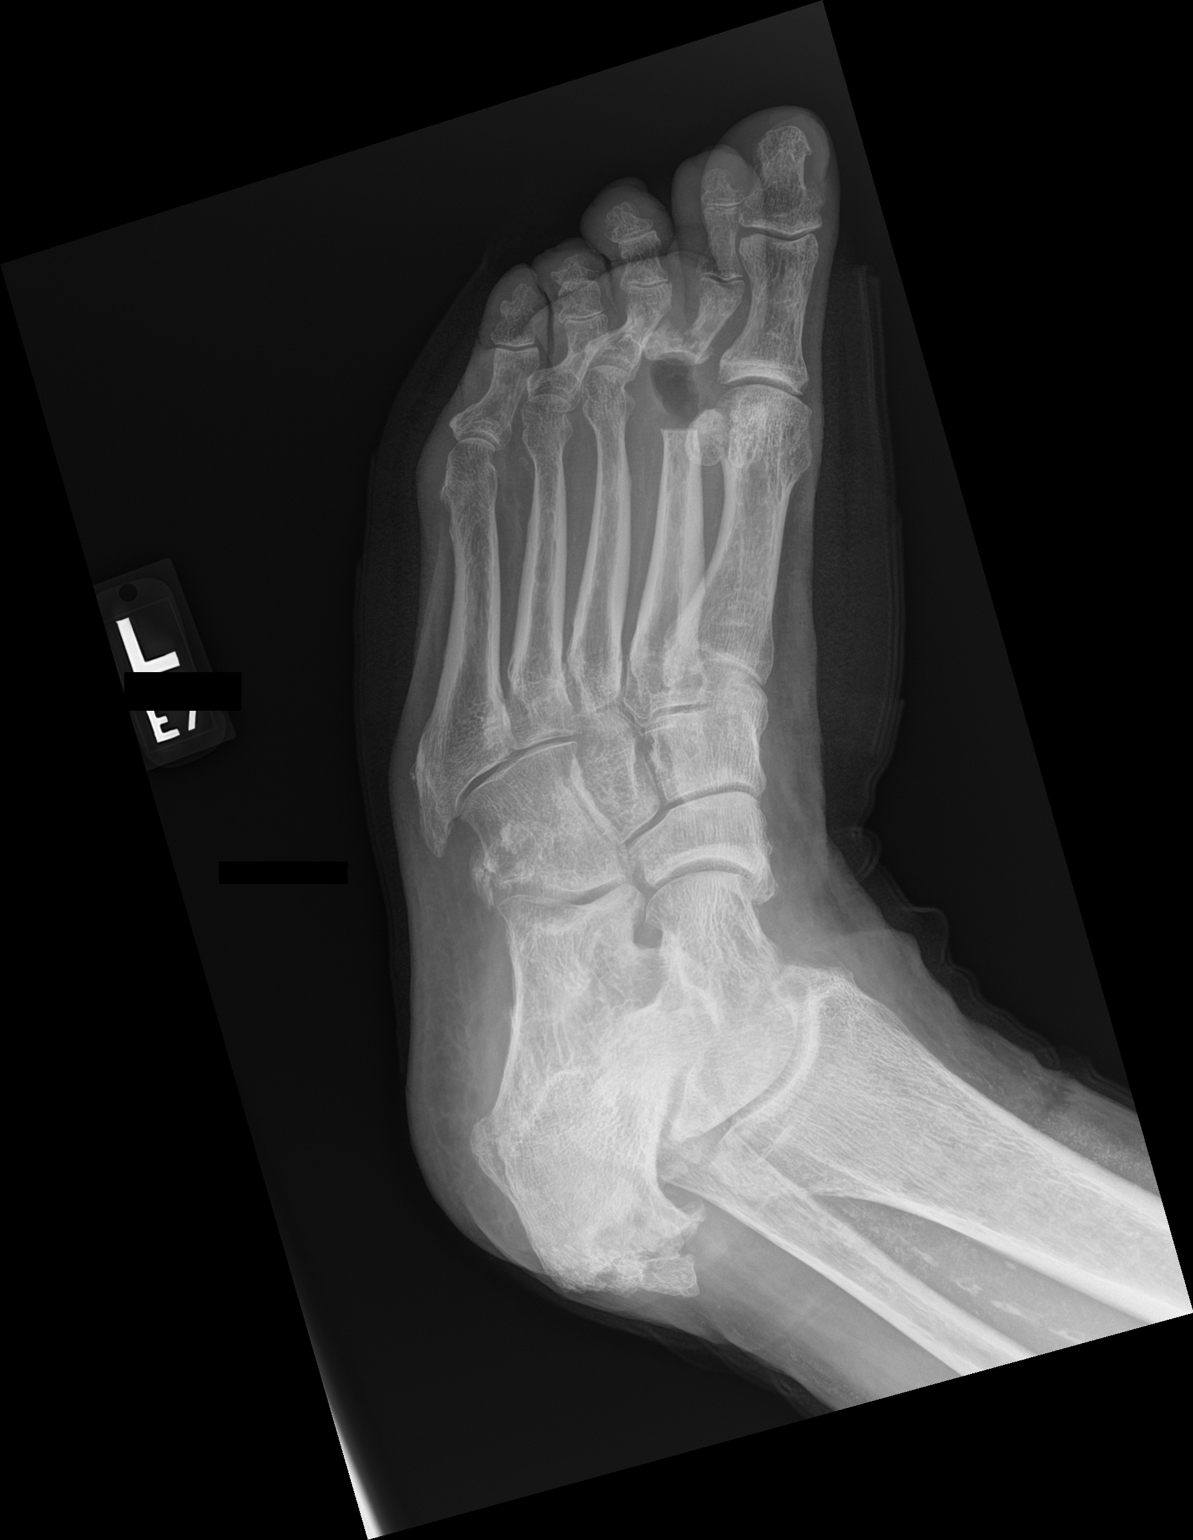

[foot lat]
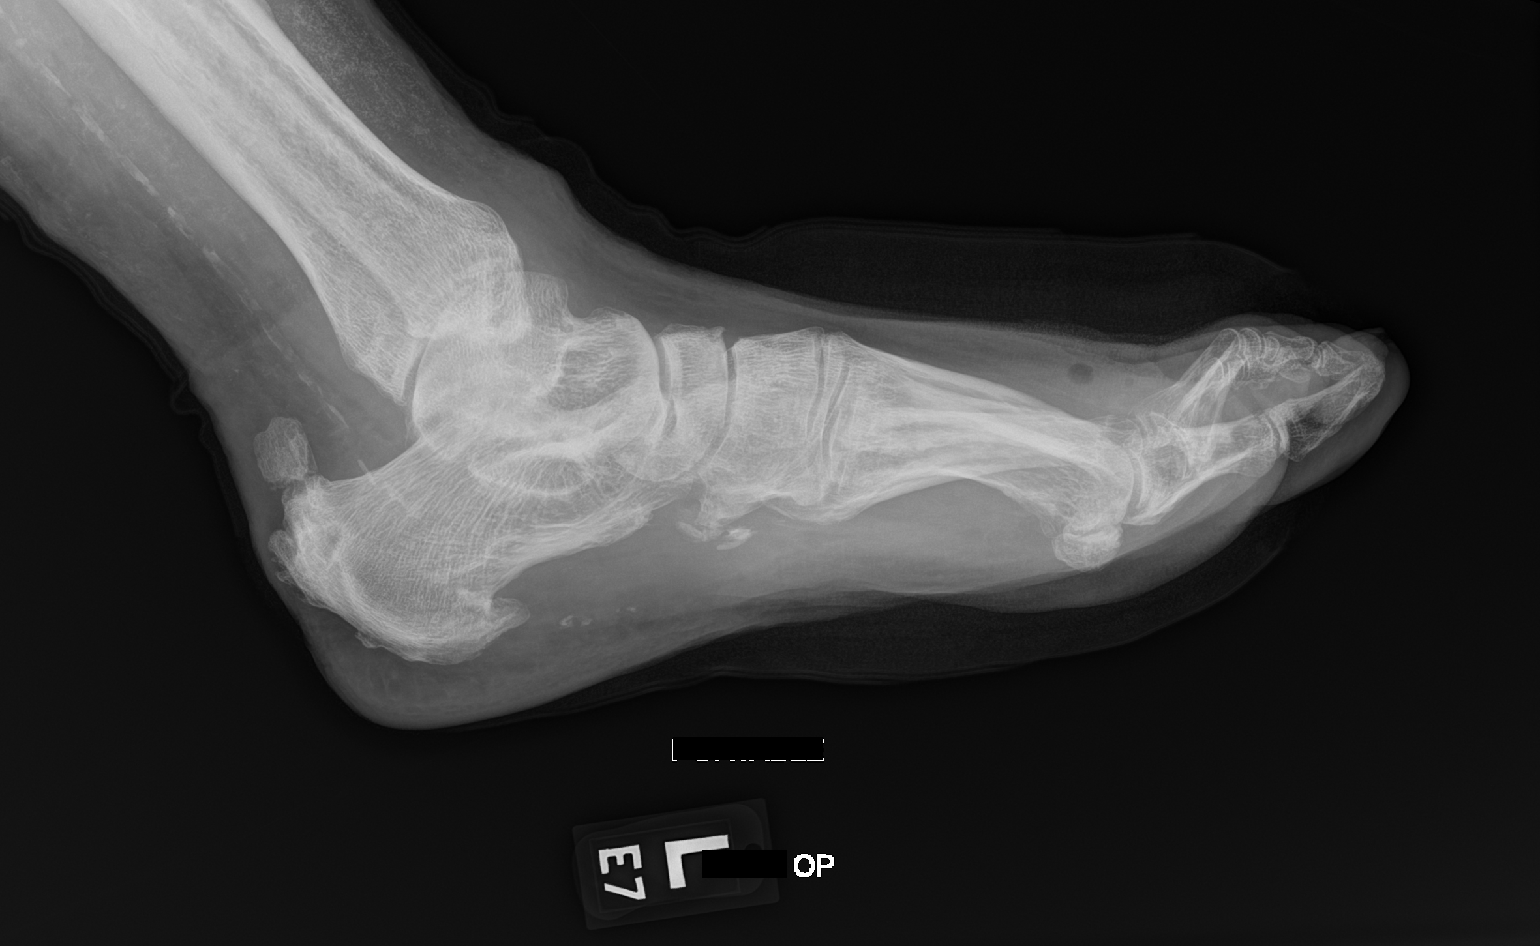

[3 of 3 positions shown; findings below may reference images not displayed]

FINDINGS: Status post surgical resection of the distal portion of the second
metatarsal. Moderate posterior calcaneal spurring is noted. Vascular
calcifications are noted. Expected postoperative changes are seen in
the soft tissues in the surgical site.
IMPRESSION: Status post surgical resection of distal portion of the second
metatarsal with expected postsurgical changes.

## 2021-06-13 SURGERY — EXCISION, METATARSAL BONE, HEAD
Anesthesia: General | Site: Toe | Laterality: Left

## 2021-06-13 MED ORDER — 0.9 % SODIUM CHLORIDE (POUR BTL) OPTIME
TOPICAL | Status: DC | PRN
  Administered 2021-06-13: 1000 mL

## 2021-06-13 MED ORDER — PROPOFOL 10 MG/ML IV BOLUS
INTRAVENOUS | Status: AC
  Filled 2021-06-13: qty 20

## 2021-06-13 MED ORDER — CLINDAMYCIN PHOSPHATE 900 MG/50ML IV SOLN
900.0000 mg | Freq: Once | INTRAVENOUS | Status: AC
  Administered 2021-06-13: 900 mg via INTRAVENOUS
  Filled 2021-06-13: qty 50

## 2021-06-13 MED ORDER — PROPOFOL 10 MG/ML IV BOLUS
INTRAVENOUS | Status: DC | PRN
  Administered 2021-06-13: 20 mg via INTRAVENOUS
  Administered 2021-06-13: 30 mg via INTRAVENOUS

## 2021-06-13 MED ORDER — PHENYLEPHRINE 40 MCG/ML (10ML) SYRINGE FOR IV PUSH (FOR BLOOD PRESSURE SUPPORT)
PREFILLED_SYRINGE | INTRAVENOUS | Status: AC
  Filled 2021-06-13: qty 10

## 2021-06-13 MED ORDER — LIDOCAINE HCL (PF) 1 % IJ SOLN
INTRAMUSCULAR | Status: AC
  Filled 2021-06-13: qty 30

## 2021-06-13 MED ORDER — FENTANYL CITRATE (PF) 100 MCG/2ML IJ SOLN
INTRAMUSCULAR | Status: AC
  Filled 2021-06-13: qty 2

## 2021-06-13 MED ORDER — LACTATED RINGERS IV SOLN
INTRAVENOUS | Status: DC
  Administered 2021-06-13: 1000 mL via INTRAVENOUS

## 2021-06-13 MED ORDER — BUPIVACAINE HCL (PF) 0.5 % IJ SOLN
INTRAMUSCULAR | Status: DC | PRN
  Administered 2021-06-13: 8 mL

## 2021-06-13 MED ORDER — LIDOCAINE HCL 1 % IJ SOLN
INTRAMUSCULAR | Status: DC | PRN
  Administered 2021-06-13: 10 mL

## 2021-06-13 MED ORDER — PROPOFOL 500 MG/50ML IV EMUL
INTRAVENOUS | Status: DC | PRN
  Administered 2021-06-13: 50 ug/kg/min via INTRAVENOUS

## 2021-06-13 MED ORDER — MIDAZOLAM HCL 2 MG/2ML IJ SOLN
INTRAMUSCULAR | Status: DC | PRN
  Administered 2021-06-13: 2 mg via INTRAVENOUS

## 2021-06-13 MED ORDER — FENTANYL CITRATE (PF) 100 MCG/2ML IJ SOLN
INTRAMUSCULAR | Status: DC | PRN
  Administered 2021-06-13 (×4): 25 ug via INTRAVENOUS

## 2021-06-13 MED ORDER — CHLORHEXIDINE GLUCONATE 0.12 % MT SOLN
15.0000 mL | Freq: Once | OROMUCOSAL | Status: AC
  Administered 2021-06-13: 15 mL via OROMUCOSAL

## 2021-06-13 MED ORDER — SEVOFLURANE IN SOLN
RESPIRATORY_TRACT | Status: AC
  Filled 2021-06-13: qty 250

## 2021-06-13 MED ORDER — PHENYLEPHRINE HCL (PRESSORS) 10 MG/ML IV SOLN
INTRAVENOUS | Status: DC | PRN
Start: 2021-06-13 — End: 2021-06-13
  Administered 2021-06-13 (×2): 80 ug via INTRAVENOUS
  Administered 2021-06-13: 120 ug via INTRAVENOUS
  Administered 2021-06-13: 80 ug via INTRAVENOUS

## 2021-06-13 MED ORDER — LIDOCAINE HCL (PF) 2 % IJ SOLN
INTRAMUSCULAR | Status: AC
  Filled 2021-06-13: qty 5

## 2021-06-13 MED ORDER — MIDAZOLAM HCL 2 MG/2ML IJ SOLN
INTRAMUSCULAR | Status: AC
  Filled 2021-06-13: qty 2

## 2021-06-13 MED ORDER — LIDOCAINE HCL (CARDIAC) PF 100 MG/5ML IV SOSY
PREFILLED_SYRINGE | INTRAVENOUS | Status: DC | PRN
Start: 2021-06-13 — End: 2021-06-13
  Administered 2021-06-13: 40 mg via INTRATRACHEAL

## 2021-06-13 MED ORDER — BUPIVACAINE HCL (PF) 0.5 % IJ SOLN
INTRAMUSCULAR | Status: AC
  Filled 2021-06-13: qty 30

## 2021-06-13 MED ORDER — ORAL CARE MOUTH RINSE: 15.0000 mL | Freq: Once | OROMUCOSAL | Status: AC

## 2021-06-13 SURGICAL SUPPLY — 48 items
BANDAGE CONFORM 2  STR LF (GAUZE/BANDAGES/DRESSINGS) ×1 IMPLANT
BANDAGE ELASTIC 4 VELCRO NS (GAUZE/BANDAGES/DRESSINGS) ×1 IMPLANT
BANDAGE ESMARK 4X12 BL STRL LF (DISPOSABLE) ×1 IMPLANT
BANDAGE GAUZE ELAST BULKY 4 IN (GAUZE/BANDAGES/DRESSINGS) ×1 IMPLANT
BENZOIN TINCTURE PRP APPL 2/3 (GAUZE/BANDAGES/DRESSINGS) ×2 IMPLANT
BLADE AVERAGE 25X9 (BLADE) ×2 IMPLANT
BLADE OSC/SAGITTAL MD 9X18.5 (BLADE) ×1 IMPLANT
BLADE SURG 15 STRL LF DISP TIS (BLADE) ×1 IMPLANT
BLADE SURG 15 STRL SS (BLADE) ×1
BNDG CONFORM 2 STRL LF (GAUZE/BANDAGES/DRESSINGS) ×2 IMPLANT
BNDG ELASTIC 4X5.8 VLCR NS LF (GAUZE/BANDAGES/DRESSINGS) ×2 IMPLANT
BNDG ESMARK 4X12 BLUE STRL LF (DISPOSABLE) ×2
BNDG GAUZE ELAST 4 BULKY (GAUZE/BANDAGES/DRESSINGS) ×2 IMPLANT
CHLORAPREP W/TINT 26 (MISCELLANEOUS) ×1 IMPLANT
CLOSURE STERI STRIP 1/2 X4 (GAUZE/BANDAGES/DRESSINGS) ×1 IMPLANT
CLOTH BEACON ORANGE TIMEOUT ST (SAFETY) ×2 IMPLANT
COVER LIGHT HANDLE STERIS (MISCELLANEOUS) ×4 IMPLANT
CUFF TOURN SGL QUICK 18X4 (TOURNIQUET CUFF) ×2 IMPLANT
DRSG ADAPTIC 3X8 NADH LF (GAUZE/BANDAGES/DRESSINGS) ×2 IMPLANT
ELECT REM PT RETURN 9FT ADLT (ELECTROSURGICAL) ×2
ELECTRODE REM PT RTRN 9FT ADLT (ELECTROSURGICAL) ×1 IMPLANT
GAUZE SPONGE 4X4 12PLY STRL (GAUZE/BANDAGES/DRESSINGS) ×2 IMPLANT
GLOVE SURG ENC MOIS LTX SZ7.5 (GLOVE) ×2 IMPLANT
GLOVE SURG LTX SZ7 (GLOVE) ×2 IMPLANT
GLOVE SURG UNDER POLY LF SZ7 (GLOVE) ×4 IMPLANT
GLOVE SURG UNDER POLY LF SZ7.5 (GLOVE) ×2 IMPLANT
GOWN STRL REUS W/ TWL LRG LVL3 (GOWN DISPOSABLE) ×1 IMPLANT
GOWN STRL REUS W/TWL LRG LVL3 (GOWN DISPOSABLE) ×5 IMPLANT
K-WIRE 6 (WIRE)
KIT TURNOVER KIT A (KITS) ×2 IMPLANT
KWIRE 6 (WIRE) IMPLANT
MANIFOLD NEPTUNE II (INSTRUMENTS) ×2 IMPLANT
NDL HYPO 25X1 1.5 SAFETY (NEEDLE) IMPLANT
NDL HYPO 27GX1-1/4 (NEEDLE) ×2 IMPLANT
NEEDLE HYPO 25X1 1.5 SAFETY (NEEDLE) ×4 IMPLANT
NEEDLE HYPO 27GX1-1/4 (NEEDLE) ×4 IMPLANT
NS IRRIG 1000ML POUR BTL (IV SOLUTION) ×2 IMPLANT
PACK BASIC LIMB (CUSTOM PROCEDURE TRAY) ×2 IMPLANT
PAD ARMBOARD 7.5X6 YLW CONV (MISCELLANEOUS) ×2 IMPLANT
RASP SM TEAR CROSS CUT (RASP) ×1 IMPLANT
SET BASIN LINEN APH (SET/KITS/TRAYS/PACK) ×2 IMPLANT
SPONGE T-LAP 18X18 ~~LOC~~+RFID (SPONGE) ×2 IMPLANT
STRIP CLOSURE SKIN 1/2X4 (GAUZE/BANDAGES/DRESSINGS) ×2 IMPLANT
SUT BONE WAX W31G (SUTURE) ×1 IMPLANT
SUT PROLENE 4 0 PS 2 18 (SUTURE) ×1 IMPLANT
SUT VIC AB 4-0 PS2 27 (SUTURE) ×2 IMPLANT
SUT VICRYL AB 3-0 FS1 BRD 27IN (SUTURE) ×1 IMPLANT
SYR CONTROL 10ML LL (SYRINGE) ×4 IMPLANT

## 2021-06-13 NOTE — Anesthesia Procedure Notes (Signed)
Procedure Name: MAC Date/Time: 06/13/2021 7:33 AM Performed by: Karna Dupes, CRNA Pre-anesthesia Checklist: Patient identified, Emergency Drugs available, Suction available and Patient being monitored Patient Re-evaluated:Patient Re-evaluated prior to induction Oxygen Delivery Method: Non-rebreather mask

## 2021-06-13 NOTE — Brief Op Note (Signed)
06/13/2021  8:29 AM  PATIENT:  Christina Lamb  61 y.o. 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    BLOOD ADMINISTERED:none  DRAINS: none   LOCAL MEDICATIONS USED:  MARCAINE    and Amount: 10 ml pre op and 77m post op marcaine only.   SPECIMEN:  Left second metatarsal head for culture and pathology.   DISPOSITION OF SPECIMEN:  PATHOLOGY  COUNTS:  YES  TOURNIQUET:   Total Tourniquet Time Documented: Ankle (Left) - 35 minutes Total: Ankle (Left) - 35 minutes   DICTATION: .DViviann SpareDictation  PLAN OF CARE: Discharge to home after PACU  PATIENT DISPOSITION:  PACU - hemodynamically stable.   Delay start of Pharmacological VTE agent (>24hrs) due to surgical blood loss or risk of bleeding: not applicable

## 2021-06-13 NOTE — Anesthesia Preprocedure Evaluation (Signed)
Anesthesia Evaluation  Patient identified by MRN, date of birth, ID band Patient awake    Reviewed: Allergy & Precautions, H&P , NPO status , Patient's Chart, lab work & pertinent test results, reviewed documented beta blocker date and time   Airway Mallampati: II  TM Distance: >3 FB Neck ROM: full    Dental no notable dental hx.    Pulmonary neg pulmonary ROS, former smoker,    Pulmonary exam normal breath sounds clear to auscultation       Cardiovascular Exercise Tolerance: Good hypertension, + Peripheral Vascular Disease   Rhythm:regular Rate:Normal     Neuro/Psych negative neurological ROS  negative psych ROS   GI/Hepatic negative GI ROS, Neg liver ROS,   Endo/Other  negative endocrine ROSdiabetes  Renal/GU negative Renal ROS  negative genitourinary   Musculoskeletal   Abdominal   Peds  Hematology negative hematology ROS (+)   Anesthesia Other Findings   Reproductive/Obstetrics negative OB ROS                             Anesthesia Physical  Anesthesia Plan  ASA: 3  Anesthesia Plan: General   Post-op Pain Management:    Induction:   PONV Risk Score and Plan: Propofol infusion  Airway Management Planned:   Additional Equipment:   Intra-op Plan:   Post-operative Plan:   Informed Consent: I have reviewed the patients History and Physical, chart, labs and discussed the procedure including the risks, benefits and alternatives for the proposed anesthesia with the patient or authorized representative who has indicated his/her understanding and acceptance.     Dental Advisory Given  Plan Discussed with: CRNA  Anesthesia Plan Comments:         Anesthesia Quick Evaluation

## 2021-06-13 NOTE — Discharge Instructions (Signed)

## 2021-06-13 NOTE — Op Note (Signed)
06/13/2021  8:29 AM  PATIENT:  Christina Lamb  61 y.o. 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   SPECIMEN:  Left second metatarsal head for culture and pathology.   DISPOSITION OF SPECIMEN:  PATHOLOGY  TOURNIQUET:   Total Tourniquet Time Documented: Ankle (Left) - 35 minutes Total: Ankle (Left) - 35 minutes  Patient was brought into the operating room laid supine on the operating table. Ankle tourniquet was applied to the surgical extremity. Following IV sedation, a local block was achieved using 10 cc of mixture of 1% plain lidocaine with 0.5% marcaine. The foot was the prepped, scrubbed and draped in aseptic manner. Using an esmarch band the tourniquet on the surgical site was inflatted at 2530mG.    Attention was directed towards the left second toe. 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 MRI was performed and there was bone infection of the second toe and second met head. Patient is against the amputation and wants only met head taken out and see if the ulcer heals. She is also willing to get PICC line if need to.   Attention was directed towards the dorsal aspect of the left second MPJ. 3cm curvenlinear incision was made down to skin and soft tissue using sharp #15 blade. All neurovascular bundles were retracted. At this the the extensor tendon was identified and retracted. Capsular incision was made to access the second metatarsal head. The second metatarsal head was freed from collateral  ligaments and adjacent soft tissue. Using sagittal saw the from dorsal to plantar, the bone was resected. The second metatarsal head was removed and send for bone culture and pathology. There was no purulent drainage noted. Deep wound cultures obtained. Wound was irrigated with normal saline. Bone wax was used to cover the remaining metatarsal. Deep tissue and subcutaneous tissue was closed using 3-0 Vicryl. The skin was closed using 4-0 prolene. Additional post op block was given using 8cc of 0.5% plain marcaine. Dry sterile dressing applied to the left foot. The tourniquet was deflated. Capillary refill time was brisk to lesser toes.    Patient was transferred to PACU with VSS.

## 2021-06-13 NOTE — H&P (Signed)
.  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  The various methods of treatment have been discussed with the patient.  No guarantees were given.  After consideration of risks, benefits and other options for treatment, the patient has consented to surgery.  I have reviewed the patients chart and labs.    Patient Vitals for the past 24 hrs:  BP Temp Temp src Pulse Resp SpO2  06/13/21 0652 (!) 105/57 98.5 F (36.9 C) Oral 80 16 100 %    A history and physical examination was performed in my office.  The patient was reexamined.  There have been no changes to this history and physical examination.  Erskine Emery, DPM

## 2021-06-13 NOTE — Transfer of Care (Signed)
Immediate Anesthesia Transfer of Care Note  Patient: Christina Lamb  Procedure(s) Performed: 2ND METATARSAL HEAD RESECTION LEFT TOE (Left: Toe)  Patient Location: Short Stay  Anesthesia Type:General  Level of Consciousness: awake, alert  and oriented  Airway & Oxygen Therapy: Patient Spontanous Breathing  Post-op Assessment: Report given to RN and Post -op Vital signs reviewed and stable  Post vital signs: Reviewed and stable  Last Vitals:  Vitals Value Taken Time  BP    Temp    Pulse    Resp    SpO2      Last Pain:  Vitals:   06/13/21 0652  TempSrc: Oral  PainSc: 0-No pain      Patients Stated Pain Goal: 7 (06/13/21 9379)  Complications: No notable events documented.

## 2021-06-14 ENCOUNTER — Encounter (HOSPITAL_COMMUNITY): Payer: Self-pay | Admitting: Podiatry

## 2021-06-14 NOTE — Anesthesia Postprocedure Evaluation (Signed)
Anesthesia Post Note  Patient: Christina Lamb  Procedure(s) Performed: 2ND METATARSAL HEAD RESECTION LEFT TOE (Left: Toe)  Patient location during evaluation: Phase II Anesthesia Type: General Level of consciousness: awake Pain management: pain level controlled Vital Signs Assessment: post-procedure vital signs reviewed and stable Respiratory status: spontaneous breathing and respiratory function stable Cardiovascular status: blood pressure returned to baseline and stable Postop Assessment: no headache and no apparent nausea or vomiting Anesthetic complications: no Comments: Late entry   No notable events documented.   Last Vitals:  Vitals:   06/13/21 0652 06/13/21 0841  BP: (!) 105/57   Pulse: 80 88  Resp: 16 16  Temp: 36.9 C 36.4 C  SpO2: 100% 94%    Last Pain:  Vitals:   06/13/21 0841  TempSrc: Oral  PainSc: 0-No pain                 Louann Sjogren

## 2021-06-18 LAB — AEROBIC/ANAEROBIC CULTURE W GRAM STAIN (SURGICAL/DEEP WOUND)
Culture: NO GROWTH
Culture: NO GROWTH
Gram Stain: NONE SEEN
Gram Stain: NONE SEEN

## 2021-06-18 LAB — SURGICAL PATHOLOGY

## 2021-07-18 ENCOUNTER — Telehealth: Payer: Self-pay

## 2021-07-18 NOTE — Telephone Encounter (Signed)
Called pt after receiving refill request for Lipitor. Pt has received refill from PCP. No questions/concerns at this time. ?

## 2023-04-09 ENCOUNTER — Other Ambulatory Visit: Payer: Self-pay | Admitting: Internal Medicine

## 2023-04-09 DIAGNOSIS — Z1231 Encounter for screening mammogram for malignant neoplasm of breast: Secondary | ICD-10-CM

## 2023-04-15 ENCOUNTER — Ambulatory Visit
Admission: RE | Admit: 2023-04-15 | Discharge: 2023-04-15 | Disposition: A | Discharge status: Home / Self Care | Payer: No Typology Code available for payment source | Source: Ambulatory Visit | Attending: Internal Medicine | Admitting: Internal Medicine

## 2023-04-15 DIAGNOSIS — Z1231 Encounter for screening mammogram for malignant neoplasm of breast: Secondary | ICD-10-CM

## 2024-01-22 ENCOUNTER — Other Ambulatory Visit: Payer: Self-pay | Admitting: *Deleted

## 2024-01-22 DIAGNOSIS — I70219 Atherosclerosis of native arteries of extremities with intermittent claudication, unspecified extremity: Secondary | ICD-10-CM

## 2024-02-16 NOTE — Progress Notes (Unsigned)
 Patient name: Christina Lamb MRN: [id number] DOB: 07-25-1960 Sex: female  REASON FOR CONSULT: Nonhealing wound left great toe  HPI: Christina Lamb is a 63 y.o. female, with history of diabetes and hypertension and PAD presents for evaluation of nonhealing wound to the left great toe.  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  Past Medical History:  Diagnosis Date   Diabetes mellitus without complication (HCC)    Hypertension     Past Surgical History:  Procedure Laterality Date   ABDOMINAL AORTOGRAM W/LOWER EXTREMITY Bilateral 05/11/2020   Procedure: ABDOMINAL AORTOGRAM W/LOWER EXTREMITY;  Surgeon: Gretta Lonni PARAS, MD;  Location: [facility];  Service: Cardiovascular;  Laterality: Bilateral;   ACHILLES TENDON REPAIR     AMPUTATION TOE Right 07/31/2020   Procedure: Partial fifth ray amputation right foot.;  Surgeon: Tobie Buckles, DPM;  Location: [facility];  Service: Podiatry;  Laterality: Right;   AMPUTATION TOE Right 10/18/2020   Procedure: AMPUTATION RIGHT THIRD TOE;  Surgeon: Tobie Buckles, DPM;  Location: [facility];  Service: Podiatry;  Laterality: Right;   ENDARTERECTOMY FEMORAL Right 05/19/2020   Procedure: RIGHT ENDARTERECTOMY COMMON FEMORAL WITH PROFUNDAPLASTY;  Surgeon: Gretta Lonni PARAS, MD;  Location: [facility] OR;  Service: Vascular;  Laterality: Right;   EYE SURGERY Bilateral    FEMORAL-POPLITEAL BYPASS GRAFT Right 05/19/2020   Procedure: RIGHT COMMON FEMORAL TO ABOVE KNEE POPLITEAL BYPASS GRAFTING WITH PROPATEN;  Surgeon: Gretta Lonni PARAS, MD;  Location: [facility] OR;  Service: Vascular;  Laterality: Right;   METATARSAL HEAD EXCISION Left 06/13/2021   Procedure: 2ND METATARSAL HEAD RESECTION LEFT TOE;  Surgeon: Tobie Buckles, DPM;  Location: [facility];  Service: Podiatry;  Laterality:  Left;   PATCH ANGIOPLASTY Right 05/19/2020   Procedure: PATCH ANGIOPLASTY;  Surgeon: Gretta Lonni PARAS, MD;  Location: [facility] OR;  Service: Vascular;  Laterality: Right;    Family History  Problem Relation Age of Onset   Heart disease Father    Breast cancer Neg Hx     SOCIAL HISTORY: Social History   Socioeconomic History   Marital status: Single    Spouse name: Not on file   Number of children: Not on file   Years of education: Not on file   Highest education level: Not on file  Occupational History   Not on file  Tobacco Use   Smoking status: Former    Current packs/day: 0.00    Average packs/day: 0.5 packs/day for 15.0 years (7.5 ttl pk-yrs)    Types: Cigarettes    Start date: 2003    Quit date: 2018    Years since quitting: 7.7   Smokeless tobacco: Never  Substance and Sexual Activity   Alcohol use: Never   Drug use: Never   Sexual activity: Not on file  Other Topics Concern   Not on file  Social History Narrative   Not on file   Social Drivers of Health   Financial Resource Strain: Not on file  Food Insecurity: Not on file  Transportation Needs: Not on file  Physical Activity: Not on file  Stress: Not on file  Social Connections: Not on file  Intimate Partner Violence: Not At Risk (02/14/2022)   Received from [facility]   Humiliation, Afraid, Rape, and Kick questionnaire    Within the last year, have you been afraid  of your partner or ex-partner?: No    Within the last year, have you been humiliated or emotionally abused in other ways by your partner or ex-partner?: No    Within the last year, have you been kicked, hit, slapped, or otherwise physically hurt by your partner or ex-partner?: No    Within the last year, have you been raped or forced to have any kind of sexual activity by your partner or ex-partner?: No    Allergies  Allergen Reactions   Hydromet [Hydrocodone Bit-Homatrop Mbr] Nausea And Vomiting   Penicillins Swelling    Reaction: 2  years   Codeine Nausea And Vomiting    Current Outpatient Medications  Medication Sig Dispense Refill   acetaminophen  (TYLENOL ) 500 MG tablet Take 500 mg by mouth every 6 (six) hours as needed for moderate pain or headache.     aspirin  EC 81 MG tablet Take 81 mg by mouth daily.     atorvastatin  (LIPITOR ) 80 MG tablet Take 1 tablet (80 mg total) by mouth daily. 30 tablet 11   colchicine 0.6 MG tablet Take 0.6 mg by mouth 2 (two) times daily as needed (gout).     dapagliflozin  propanediol (FARXIGA ) 10 MG TABS tablet Take 10 mg by mouth daily.     doxycycline (VIBRAMYCIN) 100 MG capsule Take 100 mg by mouth 2 (two) times daily.     Empagliflozin-metFORMIN HCl (SYNJARDY) 12.5-500 MG TABS Take 1 tablet by mouth 2 (two) times daily.     glimepiride (AMARYL) 4 MG tablet Take 4 mg by mouth daily with breakfast.     INVOKAMET [phone] MG TABS Take 1 tablet by mouth 2 (two) times daily.  4   latanoprost (XALATAN) 0.005 % ophthalmic solution Place 1 drop into the left eye at bedtime.     lisinopril  (PRINIVIL ,ZESTRIL ) 20 MG tablet Take 40 mg by mouth daily.     NOVOLOG  FLEXPEN 100 UNIT/ML FlexPen Inject 5 Units into the skin 2 (two) times daily as needed for high blood sugar.     omega-3 acid ethyl esters (LOVAZA) 1 g capsule Take 2 g by mouth daily.     rOPINIRole (REQUIP) 1 MG tablet Take 1 mg by mouth at bedtime.     SSD 1 % cream Apply 1 application topically 2 (two) times daily.     tiZANidine (ZANAFLEX) 4 MG tablet Take 4 mg by mouth 2 (two) times daily as needed for muscle spasms.     No current facility-administered medications for this visit.    REVIEW OF SYSTEMS:  [X]  denotes positive finding, [ ]  denotes negative finding Cardiac  Comments:  Chest pain or chest pressure: ***   Shortness of breath upon exertion:    Short of breath when lying flat:    Irregular heart rhythm:        Vascular    Pain in calf, thigh, or hip brought on by ambulation:    Pain in feet at night that wakes  you up from your sleep:     Blood clot in your veins:    Leg swelling:         Pulmonary    Oxygen at home:    Productive cough:     Wheezing:         Neurologic    Sudden weakness in arms or legs:     Sudden numbness in arms or legs:     Sudden onset of difficulty speaking or slurred speech:    Temporary loss of  vision in one eye:     Problems with dizziness:         Gastrointestinal    Blood in stool:     Vomited blood:         Genitourinary    Burning when urinating:     Blood in urine:        Psychiatric    Major depression:         Hematologic    Bleeding problems:    Problems with blood clotting too easily:        Skin    Rashes or ulcers:        Constitutional    Fever or chills:      PHYSICAL EXAM: There were no vitals filed for this visit.  GENERAL: The patient is a well-nourished female, in no acute distress. The vital signs are documented above. CARDIAC: There is a regular rate and rhythm.  VASCULAR: *** PULMONARY: There is good air exchange bilaterally without wheezing or rales. ABDOMEN: Soft and non-tender with normal pitched bowel sounds.  MUSCULOSKELETAL: There are no major deformities or cyanosis. NEUROLOGIC: No focal weakness or paresthesias are detected. SKIN: There are no ulcers or rashes noted. PSYCHIATRIC: The patient has a normal affect.  DATA:   ABI  Assessment/Plan:  63 y.o. female, with history of diabetes and hypertension and PAD presents for evaluation of nonhealing wound to the left great toe.  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   Lonni DOROTHA Gaskins, MD [facility] Office: [phone]

## 2024-02-17 ENCOUNTER — Encounter: Payer: Self-pay | Admitting: Vascular Surgery

## 2024-02-17 ENCOUNTER — Ambulatory Visit: Admitting: Vascular Surgery

## 2024-02-17 ENCOUNTER — Ambulatory Visit (INDEPENDENT_AMBULATORY_CARE_PROVIDER_SITE_OTHER)

## 2024-02-17 VITALS — BP 135/73 | HR 71 | Temp 98.0°F | Resp 18 | Ht 64.0 in | Wt 162.0 lb

## 2024-02-17 DIAGNOSIS — I70219 Atherosclerosis of native arteries of extremities with intermittent claudication, unspecified extremity: Secondary | ICD-10-CM | POA: Diagnosis not present

## 2024-02-17 DIAGNOSIS — I739 Peripheral vascular disease, unspecified: Secondary | ICD-10-CM

## 2024-02-17 DIAGNOSIS — E11621 Type 2 diabetes mellitus with foot ulcer: Secondary | ICD-10-CM

## 2024-02-17 DIAGNOSIS — L97521 Non-pressure chronic ulcer of other part of left foot limited to breakdown of skin: Secondary | ICD-10-CM

## 2024-02-17 LAB — VAS US ABI WITH/WO TBI
Left ABI: 1.05
Right ABI: 1.1

## 2024-02-18 ENCOUNTER — Telehealth: Payer: Self-pay

## 2024-02-18 NOTE — Telephone Encounter (Signed)
 Attempted to call for surgery scheduling. LVM

## 2024-02-19 ENCOUNTER — Other Ambulatory Visit: Payer: Self-pay

## 2024-02-19 DIAGNOSIS — E11621 Type 2 diabetes mellitus with foot ulcer: Secondary | ICD-10-CM

## 2024-03-11 ENCOUNTER — Other Ambulatory Visit: Payer: Self-pay

## 2024-03-11 ENCOUNTER — Encounter (HOSPITAL_COMMUNITY)
Admission: RE | Disposition: A | Discharge status: Home / Self Care | Payer: Self-pay | Source: Home / Self Care | Attending: Vascular Surgery

## 2024-03-11 ENCOUNTER — Ambulatory Visit (HOSPITAL_COMMUNITY)
Admission: RE | Admit: 2024-03-11 | Discharge: 2024-03-11 | Disposition: A | Discharge status: Home / Self Care | Attending: Vascular Surgery | Admitting: Vascular Surgery

## 2024-03-11 DIAGNOSIS — Z79899 Other long term (current) drug therapy: Secondary | ICD-10-CM | POA: Insufficient documentation

## 2024-03-11 DIAGNOSIS — L97929 Non-pressure chronic ulcer of unspecified part of left lower leg with unspecified severity: Secondary | ICD-10-CM | POA: Diagnosis not present

## 2024-03-11 DIAGNOSIS — Z794 Long term (current) use of insulin: Secondary | ICD-10-CM | POA: Diagnosis not present

## 2024-03-11 DIAGNOSIS — Z7984 Long term (current) use of oral hypoglycemic drugs: Secondary | ICD-10-CM | POA: Insufficient documentation

## 2024-03-11 DIAGNOSIS — L97529 Non-pressure chronic ulcer of other part of left foot with unspecified severity: Secondary | ICD-10-CM | POA: Insufficient documentation

## 2024-03-11 DIAGNOSIS — Z87891 Personal history of nicotine dependence: Secondary | ICD-10-CM | POA: Insufficient documentation

## 2024-03-11 DIAGNOSIS — E11621 Type 2 diabetes mellitus with foot ulcer: Secondary | ICD-10-CM | POA: Insufficient documentation

## 2024-03-11 DIAGNOSIS — E1151 Type 2 diabetes mellitus with diabetic peripheral angiopathy without gangrene: Secondary | ICD-10-CM | POA: Insufficient documentation

## 2024-03-11 DIAGNOSIS — I70245 Atherosclerosis of native arteries of left leg with ulceration of other part of foot: Secondary | ICD-10-CM | POA: Diagnosis not present

## 2024-03-11 DIAGNOSIS — I70249 Atherosclerosis of native arteries of left leg with ulceration of unspecified site: Secondary | ICD-10-CM

## 2024-03-11 DIAGNOSIS — I10 Essential (primary) hypertension: Secondary | ICD-10-CM | POA: Insufficient documentation

## 2024-03-11 HISTORY — PX: LOWER EXTREMITY INTERVENTION: CATH118252

## 2024-03-11 HISTORY — PX: ABDOMINAL AORTOGRAM: CATH118222

## 2024-03-11 HISTORY — PX: LOWER EXTREMITY ANGIOGRAPHY: CATH118251

## 2024-03-11 LAB — POCT I-STAT, CHEM 8
BUN: 16 mg/dL (ref 8–23)
Calcium, Ion: 1.11 mmol/L — ABNORMAL LOW (ref 1.15–1.40)
Chloride: 103 mmol/L (ref 98–111)
Creatinine, Ser: 0.8 mg/dL (ref 0.44–1.00)
Glucose, Bld: 112 mg/dL — ABNORMAL HIGH (ref 70–99)
HCT: 47 % — ABNORMAL HIGH (ref 36.0–46.0)
Hemoglobin: 16 g/dL — ABNORMAL HIGH (ref 12.0–15.0)
Potassium: 4.3 mmol/L (ref 3.5–5.1)
Sodium: 141 mmol/L (ref 135–145)
TCO2: 28 mmol/L (ref 22–32)

## 2024-03-11 LAB — GLUCOSE, CAPILLARY: Glucose-Capillary: 126 mg/dL — ABNORMAL HIGH (ref 70–99)

## 2024-03-11 SURGERY — ABDOMINAL AORTOGRAM
Anesthesia: LOCAL

## 2024-03-11 MED ORDER — FENTANYL CITRATE (PF) 100 MCG/2ML IJ SOLN
INTRAMUSCULAR | Status: DC | PRN
  Administered 2024-03-11 (×2): 25 ug via INTRAVENOUS

## 2024-03-11 MED ORDER — SODIUM CHLORIDE 0.9 % IV SOLN: INTRAVENOUS | Status: DC

## 2024-03-11 MED ORDER — ONDANSETRON HCL 4 MG/2ML IJ SOLN: 4.0000 mg | Freq: Four times a day (QID) | INTRAMUSCULAR | Status: DC | PRN

## 2024-03-11 MED ORDER — MIDAZOLAM HCL (PF) 2 MG/2ML IJ SOLN
INTRAMUSCULAR | Status: DC | PRN
  Administered 2024-03-11 (×2): 1 mg via INTRAVENOUS

## 2024-03-11 MED ORDER — FENTANYL CITRATE (PF) 100 MCG/2ML IJ SOLN
INTRAMUSCULAR | Status: AC
  Filled 2024-03-11: qty 2

## 2024-03-11 MED ORDER — HEPARIN SODIUM (PORCINE) 1000 UNIT/ML IJ SOLN
INTRAMUSCULAR | Status: DC | PRN
  Administered 2024-03-11: 7000 [IU] via INTRAVENOUS

## 2024-03-11 MED ORDER — CLOPIDOGREL BISULFATE 300 MG PO TABS
ORAL_TABLET | ORAL | Status: AC
  Filled 2024-03-11: qty 1

## 2024-03-11 MED ORDER — HEPARIN (PORCINE) IN NACL 1000-0.9 UT/500ML-% IV SOLN
INTRAVENOUS | Status: DC | PRN
  Administered 2024-03-11 (×2): 500 mL

## 2024-03-11 MED ORDER — CLOPIDOGREL BISULFATE 75 MG PO TABS: 75.0000 mg | ORAL_TABLET | Freq: Every day | ORAL | Status: DC

## 2024-03-11 MED ORDER — OXYCODONE HCL 5 MG PO TABS: 5.0000 mg | ORAL_TABLET | ORAL | Status: DC | PRN

## 2024-03-11 MED ORDER — ACETAMINOPHEN 325 MG PO TABS: 650.0000 mg | ORAL_TABLET | ORAL | Status: DC | PRN

## 2024-03-11 MED ORDER — CLOPIDOGREL BISULFATE 300 MG PO TABS
ORAL_TABLET | ORAL | Status: DC | PRN
  Administered 2024-03-11: 300 mg via ORAL

## 2024-03-11 MED ORDER — SODIUM CHLORIDE 0.9% FLUSH: 3.0000 mL | Freq: Two times a day (BID) | INTRAVENOUS | Status: DC

## 2024-03-11 MED ORDER — MIDAZOLAM HCL 2 MG/2ML IJ SOLN
INTRAMUSCULAR | Status: AC
  Filled 2024-03-11: qty 2

## 2024-03-11 MED ORDER — IODIXANOL 320 MG/ML IV SOLN
INTRAVENOUS | Status: DC | PRN
  Administered 2024-03-11: 60 mL

## 2024-03-11 MED ORDER — CLOPIDOGREL BISULFATE 75 MG PO TABS
75.0000 mg | ORAL_TABLET | Freq: Every day | ORAL | 11 refills | Status: AC
Start: 2024-03-11 — End: 2025-03-11

## 2024-03-11 MED ORDER — LABETALOL HCL 5 MG/ML IV SOLN: 10.0000 mg | INTRAVENOUS | Status: DC | PRN

## 2024-03-11 MED ORDER — HYDRALAZINE HCL 20 MG/ML IJ SOLN: 5.0000 mg | INTRAMUSCULAR | Status: DC | PRN

## 2024-03-11 MED ORDER — HEPARIN SODIUM (PORCINE) 1000 UNIT/ML IJ SOLN
INTRAMUSCULAR | Status: AC
  Filled 2024-03-11: qty 10

## 2024-03-11 MED ORDER — SODIUM CHLORIDE 0.9% FLUSH: 3.0000 mL | INTRAVENOUS | Status: DC | PRN

## 2024-03-11 MED ORDER — LIDOCAINE HCL (PF) 1 % IJ SOLN
INTRAMUSCULAR | Status: AC
Start: 2024-03-11 — End: 2024-03-11
  Filled 2024-03-11: qty 60

## 2024-03-11 MED ORDER — LIDOCAINE HCL (PF) 1 % IJ SOLN
INTRAMUSCULAR | Status: DC | PRN
Start: 2024-03-11 — End: 2024-03-11
  Administered 2024-03-11: 10 mL via INTRADERMAL

## 2024-03-11 MED ORDER — CLOPIDOGREL BISULFATE 75 MG PO TABS: 300.0000 mg | ORAL_TABLET | Freq: Once | ORAL | Status: DC

## 2024-03-11 MED ORDER — SODIUM CHLORIDE 0.9 % IV SOLN: 250.0000 mL | INTRAVENOUS | Status: DC | PRN

## 2024-03-11 SURGICAL SUPPLY — 16 items
BALLOON JADE .0142.5 X 40 (BALLOONS) IMPLANT
BALLOON STRLNG OTW 2.5X80X150 (BALLOONS) IMPLANT
CATH OMNI FLUSH 5F 65CM (CATHETERS) IMPLANT
CATH QUICKCROSS .018X135CM (MICROCATHETER) IMPLANT
DEVICE CLOSURE MYNXGRIP 5F (Vascular Products) IMPLANT
KIT ENCORE 26 ADVANTAGE (KITS) IMPLANT
KIT MICROPUNCTURE NIT STIFF (SHEATH) IMPLANT
KIT SINGLE USE MANIFOLD (KITS) IMPLANT
SET ATX-X65L (MISCELLANEOUS) IMPLANT
SHEATH CATAPULT 5F 45 MP (SHEATH) IMPLANT
SHEATH PINNACLE 5F 10CM (SHEATH) IMPLANT
SHEATH PROBE COVER 6X72 (BAG) IMPLANT
TRAY PV CATH (CUSTOM PROCEDURE TRAY) ×1 IMPLANT
WIRE BENTSON .035X145CM (WIRE) IMPLANT
WIRE G V18X300CM (WIRE) IMPLANT
WIRE SPARTACORE .014X300CM (WIRE) IMPLANT

## 2024-03-11 NOTE — Op Note (Signed)
 Patient name: Christina Lamb MRN: [id number] DOB: 1960-11-26 Sex: female  03/11/2024 Pre-operative Diagnosis: Nonhealing left lower extremity wound Post-operative diagnosis:  Same Surgeon:  Lonni DOROTHA Gaskins, MD Procedure Performed: 1.  Ultrasound-guided access right common femoral artery 2.  Aortogram with catheter selection of aorta 3.  Left lower extremity arteriogram with catheter selection of anterior tibial and peroneal artery 4.  Angioplasty left anterior tibial artery (2.5 mm x 80 mm Sterling) 5.  Angioplasty of left TP trunk and peroneal artery (2.5 mm Jade) 6.  Mynx closure of the right common femoral artery 7.  55 minutes of monitored moderate conscious sedation time  Indications: 63 year old female with history of PAD that presents with nonhealing left lower extremity wound.  She presents for left lower extremity angiogram with possible invention after risk-benefits discussed  Findings:   Ultrasound-guided access right common femoral artery.  Aortogram showed patent renals with patent infra-renal aorta and patent iliacs without flow-limiting stenosis.  On the left the common femoral and profunda as well as the SFA and above and below-knee popliteal artery were patent without flow-limiting stenosis.  She has two-vessel runoff in the anterior tibial and peroneal artery.  The proximal anterior tibial had a 80% stenosis at the ostium as well as some less severe disease in the proximal to mid vessel with diffuse disease.  The TP trunk had an ostial 70% stenosis as well as a second 60% stenosis in the proximal peroneal artery.  All of these lesions were treated with kissing wires down the AT and peroneal using initially 2.5 mm Sterling and then a noncompliant 2.5 mm Jade.   Procedure:  The patient was identified in the holding area and taken to room 8.  The patient was then placed supine on the table and prepped and draped in the usual sterile fashion.  A time out was called.   Patient received Versed  and fentanyl  for conscious moderate sedation.  Vital signs are monitored including heart rate, respiratory rate, oxygenation and blood pressure.  I was present for all moderate sedation.  Ultrasound was used to evaluate the right common femoral artery.  It was patent .  A digital ultrasound image was acquired.  A micropuncture needle was used to access the right common femoral artery under ultrasound guidance.  An 018 wire was advanced without resistance and a micropuncture sheath was placed.  The 018 wire was removed and a benson wire was placed.  The micropuncture sheath was exchanged for a 5 french sheath.  An omniflush catheter was advanced over the wire to the level of L-1.  An abdominal angiogram was obtained.  Next, using the omniflush catheter and a benson wire, the aortic bifurcation was crossed and the catheter was placed into theleft external iliac artery and left runoff was obtained.  Ultimately elected for intervention on her tibial disease.  I used a Bentson wire down the left SFA and upsized to a 5 French catapult sheath in the right groin over the aortic bifurcation.  Patient was given 100 units/kg IV heparin .  I then used a 018 quick cross catheter with a V18 wire initially to get down the anterior tibial.  The proximal anterior tibial was initially treated with a 2.5 mm x 80 mm Sterling nominal pressure for 2 minutes.  We then elected to treat further down the vessel where there was some other diffuse disease that was less limiting.  This look much better after completion.  I then placed the kissing 014 wire  down the TP trunk into the peroneal artery.  In case there was a plaque shift I went with kissing wires.  I then used a Jade balloon that was noncompliant in order to get more luminal expansion.  I used a 2.5 mm Jade at the ostium of the TP trunk and expanded to nominal pressure for 2 minutes and then moved down to the proximal peroneal.  Much better flow down the left  lower extremity.  Wires and catheters were removed.  A short 5 French sheath was placed in the right groin and Mynx deployed.  Plan: Good results today and optimized from vascular surgery standpoint.  Aspirin  Plavix statin.  Follow-up in 1 month.      Lonni DOROTHA Gaskins, MD [facility] Office: [phone]

## 2024-03-11 NOTE — H&P (Signed)
 History and Physical Interval Note:  03/11/2024 12:59 PM  [name]  has presented today for surgery, with the diagnosis of diabetic foot ulcer.  The various methods of treatment have been discussed with the patient and family. After consideration of risks, benefits and other options for treatment, the patient has consented to  Procedure(s): ABDOMINAL AORTOGRAM (N/A) Lower Extremity Angiography (N/A) LOWER EXTREMITY INTERVENTION (N/A) as a surgical intervention.  The patient's history has been reviewed, patient examined, no change in status, stable for surgery.  I have reviewed the patient's chart and labs.  Questions were answered to the patient's satisfaction.     Lonni JINNY Gaskins     Patient name: JALEIAH ASAY           MRN: [id number]        DOB: June 14, 1960            Sex: female   REASON FOR CONSULT: Nonhealing wound left great toe   HPI: LENIX BENOIST is a 63 y.o. female, with history of diabetes and hypertension and PAD presents for evaluation of nonhealing wound to the left great toe.  Patient states this has been present for about 1 year on the left big toe.  She is being followed by Dr. Tobie.   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       Past Medical History:  Diagnosis Date   Diabetes mellitus without complication (HCC)     Hypertension                 Past Surgical History:  Procedure Laterality Date   ABDOMINAL AORTOGRAM W/LOWER EXTREMITY Bilateral 05/11/2020    Procedure: ABDOMINAL AORTOGRAM W/LOWER EXTREMITY;  Surgeon: Gaskins Lonni JINNY, MD;  Location: [facility];  Service: Cardiovascular;  Laterality: Bilateral;   ACHILLES TENDON REPAIR       AMPUTATION TOE Right 07/31/2020    Procedure: Partial fifth ray amputation right foot.;  Surgeon: Tobie Buckles, DPM;   Location: [facility];  Service: Podiatry;  Laterality: Right;   AMPUTATION TOE Right 10/18/2020    Procedure: AMPUTATION RIGHT THIRD TOE;  Surgeon: Tobie Buckles, DPM;  Location: [facility];  Service: Podiatry;  Laterality: Right;   ENDARTERECTOMY FEMORAL Right 05/19/2020    Procedure: RIGHT ENDARTERECTOMY COMMON FEMORAL WITH PROFUNDAPLASTY;  Surgeon: Gaskins Lonni JINNY, MD;  Location: [facility] OR;  Service: Vascular;  Laterality: Right;   EYE SURGERY Bilateral     FEMORAL-POPLITEAL BYPASS GRAFT Right 05/19/2020    Procedure: RIGHT COMMON FEMORAL TO ABOVE KNEE POPLITEAL BYPASS GRAFTING WITH PROPATEN;  Surgeon: Gaskins Lonni JINNY, MD;  Location: [facility] OR;  Service: Vascular;  Laterality: Right;   METATARSAL HEAD EXCISION Left 06/13/2021    Procedure: 2ND METATARSAL HEAD RESECTION LEFT TOE;  Surgeon: Tobie Buckles, DPM;  Location: [facility];  Service: Podiatry;  Laterality: Left;   PATCH ANGIOPLASTY Right 05/19/2020    Procedure: PATCH ANGIOPLASTY;  Surgeon: Gaskins Lonni JINNY, MD;  Location: [facility] OR;  Service: Vascular;  Laterality: Right;               Family History  Problem Relation Age of Onset   Heart disease Father     Breast cancer Neg Hx            SOCIAL HISTORY: Social History  Socioeconomic History   Marital status: Single      Spouse name: Not on file   Number of children: Not on file   Years of education: Not on file   Highest education level: Not on file  Occupational History   Not on file  Tobacco Use   Smoking status: Former      Current packs/day: 0.00      Average packs/day: 0.5 packs/day for 15.0 years (7.5 ttl pk-yrs)      Types: Cigarettes      Start date: 2003      Quit date: 2018      Years since quitting: 7.7   Smokeless tobacco: Never  Substance and Sexual Activity   Alcohol use: Never   Drug use: Never   Sexual activity: Not on file  Other Topics Concern   Not on file  Social History Narrative   Not on file    Social Drivers of Health         Financial Resource Strain: Not on file  Food Insecurity: Not on file  Transportation Needs: Not on file  Physical Activity: Not on file  Stress: Not on file  Social Connections: Not on file  Intimate Partner Violence: Not At Risk (02/14/2022)    Received from [facility]    Humiliation, Afraid, Rape, and Kick questionnaire     Within the last year, have you been afraid of your partner or ex-partner?: No     Within the last year, have you been humiliated or emotionally abused in other ways by your partner or ex-partner?: No     Within the last year, have you been kicked, hit, slapped, or otherwise physically hurt by your partner or ex-partner?: No     Within the last year, have you been raped or forced to have any kind of sexual activity by your partner or ex-partner?: No      Allergies       Allergies  Allergen Reactions   Hydromet [Hydrocodone Bit-Homatrop Mbr] Nausea And Vomiting   Penicillins Swelling      Reaction: 2 years   Codeine Nausea And Vomiting              Current Outpatient Medications  Medication Sig Dispense Refill   acetaminophen  (TYLENOL ) 500 MG tablet Take 500 mg by mouth every 6 (six) hours as needed for moderate pain or headache.       aspirin  EC 81 MG tablet Take 81 mg by mouth daily.       atorvastatin  (LIPITOR ) 80 MG tablet Take 1 tablet (80 mg total) by mouth daily. 30 tablet 11   colchicine 0.6 MG tablet Take 0.6 mg by mouth 2 (two) times daily as needed (gout).       dapagliflozin  propanediol (FARXIGA ) 10 MG TABS tablet Take 10 mg by mouth daily.       doxycycline (VIBRAMYCIN) 100 MG capsule Take 100 mg by mouth 2 (two) times daily.       Empagliflozin-metFORMIN HCl (SYNJARDY) 12.5-500 MG TABS Take 1 tablet by mouth 2 (two) times daily.       glimepiride (AMARYL) 4 MG tablet Take 4 mg by mouth daily with breakfast.       INVOKAMET [phone] MG TABS Take 1 tablet by mouth 2 (two) times daily.   4   latanoprost (XALATAN) 0.005 % ophthalmic  solution Place 1 drop into the left eye at bedtime.       lisinopril  (PRINIVIL ,ZESTRIL ) 20 MG tablet  Take 40 mg by mouth daily.       NOVOLOG  FLEXPEN 100 UNIT/ML FlexPen Inject 5 Units into the skin 2 (two) times daily as needed for high blood sugar.       omega-3 acid ethyl esters (LOVAZA) 1 g capsule Take 2 g by mouth daily.       rOPINIRole (REQUIP) 1 MG tablet Take 1 mg by mouth at bedtime.       SSD 1 % cream Apply 1 application topically 2 (two) times daily.       tiZANidine (ZANAFLEX) 4 MG tablet Take 4 mg by mouth 2 (two) times daily as needed for muscle spasms.          No current facility-administered medications for this visit.        REVIEW OF SYSTEMS:  [X]  denotes positive finding, [ ]  denotes negative finding Cardiac   Comments:  Chest pain or chest pressure:      Shortness of breath upon exertion:      Short of breath when lying flat:      Irregular heart rhythm:             Vascular      Pain in calf, thigh, or hip brought on by ambulation:      Pain in feet at night that wakes you up from your sleep:       Blood clot in your veins:      Leg swelling:              Pulmonary      Oxygen at home:      Productive cough:       Wheezing:              Neurologic      Sudden weakness in arms or legs:       Sudden numbness in arms or legs:       Sudden onset of difficulty speaking or slurred speech:      Temporary loss of vision in one eye:       Problems with dizziness:              Gastrointestinal      Blood in stool:       Vomited blood:              Genitourinary      Burning when urinating:       Blood in urine:             Psychiatric      Major depression:              Hematologic      Bleeding problems:      Problems with blood clotting too easily:             Skin      Rashes or ulcers:             Constitutional      Fever or chills:          PHYSICAL EXAM: There were no vitals filed for this visit.   GENERAL: The patient is a  well-nourished female, in no acute distress. The vital signs are documented above. CARDIAC: There is a regular rate and rhythm.  VASCULAR:  Bilateral femoral pulses palpable Right DP palpable Difficult to appreciate left pedal pulse Left great toe ulcer as pictured PULMONARY: No respiratory distress. ABDOMEN: Soft and non-tender. MUSCULOSKELETAL: There are no major deformities or cyanosis.  NEUROLOGIC: No focal weakness or paresthesias are detected. PSYCHIATRIC: The patient has a normal affect.      DATA:    ABI today are 1.10 on the right triphasic and 1.05 on the left multiphasic   Assessment/Plan:   63 y.o. female, with history of diabetes and hypertension and PAD presents for evaluation of nonhealing wound to the left great toe.  This has been present for about 1 year.  She has an excellent right DP pulse and I have a hard time palpating a left pedal pulse on the side of the wound.  I have recommended aortogram lower extremity arteriogram with a focus on the left lower extremity.  Prior angiogram in 2022 did show some left common femoral and SFA disease.  Discussed this is to ensure she is optimized from a vascular surgery standpoint and may be amendable to percutaneous intervention.  Discussed this being done at [facility] under moderate sedation risk benefits discussed.   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  This all appears patent and she has a palpable right DP pulse with normal ABI.     Lonni DOROTHA Gaskins, MD [facility] [place] Office: [phone]

## 2024-03-11 NOTE — Discharge Instructions (Signed)
 Femoral Site Care This sheet gives you information about how to care for yourself after your procedure. Your health care provider may also give you more specific instructions. If you have problems or questions, contact your health care provider. What can I expect after the procedure?  After the procedure, it is common to have: Bruising that usually fades within 1-2 weeks. Tenderness at the site. Follow these instructions at home: Wound care Follow instructions from your health care provider about how to take care of your insertion site. Make sure you: Wash your hands with soap and water  before you change your bandage (dressing). If soap and water  are not available, use hand sanitizer. Remove your dressing as told by your health care provider. 24 hours Do not take baths, swim, or use a hot tub until your health care provider approves. You may shower 24-48 hours after the procedure or as told by your health care provider. Gently wash the site with plain soap and water . Pat the area dry with a clean towel. Do not rub the site. This may cause bleeding. Do not apply powder or lotion to the site. Keep the site clean and dry. Check your femoral site every day for signs of infection. Check for: Redness, swelling, or pain. Fluid or blood. Warmth. Pus or a bad smell. Activity For the first 2-3 days after your procedure, or as long as directed: Avoid climbing stairs as much as possible. Do not squat. Do not lift anything that is heavier than 10 lb (4.5 kg), or the limit that you are told, until your health care provider says that it is safe. For 5 days Rest as directed. Avoid sitting for a long time without moving. Get up to take short walks every 1-2 hours. Do not drive for 24 hours if you were given a medicine to help you relax (sedative). General instructions Take over-the-counter and prescription medicines only as told by your health care provider. Keep all follow-up visits as told by your  health care provider. This is important. Contact a health care provider if you have: A fever or chills. You have redness, swelling, or pain around your insertion site. Get help right away if: The catheter insertion area swells very fast. You pass out. You suddenly start to sweat or your skin gets clammy. The catheter insertion area is bleeding, and the bleeding does not stop when you hold steady pressure on the area. The area near or just beyond the catheter insertion site becomes pale, cool, tingly, or numb. These symptoms may represent a serious problem that is an emergency. Do not wait to see if the symptoms will go away. Get medical help right away. Call your local emergency services (911 in the [place] Do not drive yourself to the hospital. Summary After the procedure, it is common to have bruising that usually fades within 1-2 weeks. Check your femoral site every day for signs of infection. Do not lift anything that is heavier than 10 lb (4.5 kg), or the limit that you are told, until your health care provider says that it is safe. This information is not intended to replace advice given to you by your health care provider. Make sure you discuss any questions you have with your health care provider. Document Revised: 05/05/2017 Document Reviewed: 05/05/2017 Elsevier Patient Education  2020 [employer].

## 2024-03-11 NOTE — Progress Notes (Signed)
 Nidia BATTLE RN reviewed discharge information with patient and family member, no further questions at this time.

## 2024-03-11 NOTE — H&P (Signed)
 History and Physical Interval Note:  03/11/2024 11:08 AM  Christina Lamb  has presented today for surgery, with the diagnosis of diabetic foot ulcer.  The various methods of treatment have been discussed with the patient and family. After consideration of risks, benefits and other options for treatment, the patient has consented to  Procedure(s): ABDOMINAL AORTOGRAM (N/A) Lower Extremity Angiography (N/A) LOWER EXTREMITY INTERVENTION (N/A) as a surgical intervention.  The patient's history has been reviewed, patient examined, no change in status, stable for surgery.  I have reviewed the patient's chart and labs.  Questions were answered to the patient's satisfaction.    Left leg angio for tissue loss  Lonni JINNY Gaskins     Patient name: Christina Lamb           MRN: [id number]        DOB: 01/24/1961            Sex: female   REASON FOR CONSULT: Nonhealing wound left great toe   HPI: Christina Lamb is a 63 y.o. female, with history of diabetes and hypertension and PAD presents for evaluation of nonhealing wound to the left great toe.  Patient states this has been present for about 1 year on the left big toe.  She is being followed by Dr. Tobie.   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       Past Medical History:  Diagnosis Date   Diabetes mellitus without complication (HCC)     Hypertension                 Past Surgical History:  Procedure Laterality Date   ABDOMINAL AORTOGRAM W/LOWER EXTREMITY Bilateral 05/11/2020    Procedure: ABDOMINAL AORTOGRAM W/LOWER EXTREMITY;  Surgeon: Gaskins Lonni JINNY, MD;  Location: [facility];  Service: Cardiovascular;  Laterality: Bilateral;   ACHILLES TENDON REPAIR       AMPUTATION TOE Right 07/31/2020    Procedure: Partial fifth ray amputation right foot.;   Surgeon: Tobie Buckles, DPM;  Location: [facility];  Service: Podiatry;  Laterality: Right;   AMPUTATION TOE Right 10/18/2020    Procedure: AMPUTATION RIGHT THIRD TOE;  Surgeon: Tobie Buckles, DPM;  Location: [facility];  Service: Podiatry;  Laterality: Right;   ENDARTERECTOMY FEMORAL Right 05/19/2020    Procedure: RIGHT ENDARTERECTOMY COMMON FEMORAL WITH PROFUNDAPLASTY;  Surgeon: Gaskins Lonni JINNY, MD;  Location: [facility] OR;  Service: Vascular;  Laterality: Right;   EYE SURGERY Bilateral     FEMORAL-POPLITEAL BYPASS GRAFT Right 05/19/2020    Procedure: RIGHT COMMON FEMORAL TO ABOVE KNEE POPLITEAL BYPASS GRAFTING WITH PROPATEN;  Surgeon: Gaskins Lonni JINNY, MD;  Location: [facility] OR;  Service: Vascular;  Laterality: Right;   METATARSAL HEAD EXCISION Left 06/13/2021    Procedure: 2ND METATARSAL HEAD RESECTION LEFT TOE;  Surgeon: Tobie Buckles, DPM;  Location: [facility];  Service: Podiatry;  Laterality: Left;   PATCH ANGIOPLASTY Right 05/19/2020    Procedure: PATCH ANGIOPLASTY;  Surgeon: Gaskins Lonni JINNY, MD;  Location: [facility] OR;  Service: Vascular;  Laterality: Right;               Family History  Problem Relation Age of Onset   Heart disease Father     Breast cancer Neg Hx            SOCIAL HISTORY: Social History  Socioeconomic History   Marital status: Single      Spouse name: Not on file   Number of children: Not on file   Years of education: Not on file   Highest education level: Not on file  Occupational History   Not on file  Tobacco Use   Smoking status: Former      Current packs/day: 0.00      Average packs/day: 0.5 packs/day for 15.0 years (7.5 ttl pk-yrs)      Types: Cigarettes      Start date: 2003      Quit date: 2018      Years since quitting: 7.7   Smokeless tobacco: Never  Substance and Sexual Activity   Alcohol use: Never   Drug use: Never   Sexual activity: Not on file  Other Topics Concern   Not on file  Social History Narrative   Not on file     Social Drivers of Health        Financial Resource Strain: Not on file  Food Insecurity: Not on file  Transportation Needs: Not on file  Physical Activity: Not on file  Stress: Not on file  Social Connections: Not on file  Intimate Partner Violence: Not At Risk (02/14/2022)    Received from [facility]    Humiliation, Afraid, Rape, and Kick questionnaire     Within the last year, have you been afraid of your partner or ex-partner?: No     Within the last year, have you been humiliated or emotionally abused in other ways by your partner or ex-partner?: No     Within the last year, have you been kicked, hit, slapped, or otherwise physically hurt by your partner or ex-partner?: No     Within the last year, have you been raped or forced to have any kind of sexual activity by your partner or ex-partner?: No      Allergies       Allergies  Allergen Reactions   Hydromet [Hydrocodone Bit-Homatrop Mbr] Nausea And Vomiting   Penicillins Swelling      Reaction: 2 years   Codeine Nausea And Vomiting              Current Outpatient Medications  Medication Sig Dispense Refill   acetaminophen  (TYLENOL ) 500 MG tablet Take 500 mg by mouth every 6 (six) hours as needed for moderate pain or headache.       aspirin  EC 81 MG tablet Take 81 mg by mouth daily.       atorvastatin  (LIPITOR ) 80 MG tablet Take 1 tablet (80 mg total) by mouth daily. 30 tablet 11   colchicine 0.6 MG tablet Take 0.6 mg by mouth 2 (two) times daily as needed (gout).       dapagliflozin  propanediol (FARXIGA ) 10 MG TABS tablet Take 10 mg by mouth daily.       doxycycline (VIBRAMYCIN) 100 MG capsule Take 100 mg by mouth 2 (two) times daily.       Empagliflozin-metFORMIN HCl (SYNJARDY) 12.5-500 MG TABS Take 1 tablet by mouth 2 (two) times daily.       glimepiride (AMARYL) 4 MG tablet Take 4 mg by mouth daily with breakfast.       INVOKAMET [phone] MG TABS Take 1 tablet by mouth 2 (two) times daily.   4   latanoprost  (XALATAN) 0.005 % ophthalmic solution Place 1 drop into the left eye at bedtime.       lisinopril  (PRINIVIL ,ZESTRIL ) 20 MG tablet  Take 40 mg by mouth daily.       NOVOLOG  FLEXPEN 100 UNIT/ML FlexPen Inject 5 Units into the skin 2 (two) times daily as needed for high blood sugar.       omega-3 acid ethyl esters (LOVAZA) 1 g capsule Take 2 g by mouth daily.       rOPINIRole (REQUIP) 1 MG tablet Take 1 mg by mouth at bedtime.       SSD 1 % cream Apply 1 application topically 2 (two) times daily.       tiZANidine (ZANAFLEX) 4 MG tablet Take 4 mg by mouth 2 (two) times daily as needed for muscle spasms.          No current facility-administered medications for this visit.        REVIEW OF SYSTEMS:  [X]  denotes positive finding, [ ]  denotes negative finding Cardiac   Comments:  Chest pain or chest pressure:      Shortness of breath upon exertion:      Short of breath when lying flat:      Irregular heart rhythm:             Vascular      Pain in calf, thigh, or hip brought on by ambulation:      Pain in feet at night that wakes you up from your sleep:       Blood clot in your veins:      Leg swelling:              Pulmonary      Oxygen at home:      Productive cough:       Wheezing:              Neurologic      Sudden weakness in arms or legs:       Sudden numbness in arms or legs:       Sudden onset of difficulty speaking or slurred speech:      Temporary loss of vision in one eye:       Problems with dizziness:              Gastrointestinal      Blood in stool:       Vomited blood:              Genitourinary      Burning when urinating:       Blood in urine:             Psychiatric      Major depression:              Hematologic      Bleeding problems:      Problems with blood clotting too easily:             Skin      Rashes or ulcers:             Constitutional      Fever or chills:          PHYSICAL EXAM: There were no vitals filed for this visit.    GENERAL: The patient is a well-nourished female, in no acute distress. The vital signs are documented above. CARDIAC: There is a regular rate and rhythm.  VASCULAR:  Bilateral femoral pulses palpable Right DP palpable Difficult to appreciate left pedal pulse Left great toe ulcer as pictured PULMONARY: No respiratory distress. ABDOMEN: Soft and non-tender. MUSCULOSKELETAL: There are no major deformities or cyanosis.  NEUROLOGIC: No focal weakness or paresthesias are detected. PSYCHIATRIC: The patient has a normal affect.      DATA:    ABI today are 1.10 on the right triphasic and 1.05 on the left multiphasic   Assessment/Plan:   63 y.o. female, with history of diabetes and hypertension and PAD presents for evaluation of nonhealing wound to the left great toe.  This has been present for about 1 year.  She has an excellent right DP pulse and I have a hard time palpating a left pedal pulse on the side of the wound.  I have recommended aortogram lower extremity arteriogram with a focus on the left lower extremity.  Prior angiogram in 2022 did show some left common femoral and SFA disease.  Discussed this is to ensure she is optimized from a vascular surgery standpoint and may be amendable to percutaneous intervention.  Discussed this being done at [facility] under moderate sedation risk benefits discussed.   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  This all appears patent and she has a palpable right DP pulse with normal ABI.     Lonni DOROTHA Gaskins, MD [facility] [place] Office: [phone]

## 2024-03-12 ENCOUNTER — Encounter (HOSPITAL_COMMUNITY): Payer: Self-pay | Admitting: Vascular Surgery

## 2024-03-24 ENCOUNTER — Other Ambulatory Visit: Payer: Self-pay | Admitting: *Deleted

## 2024-03-24 DIAGNOSIS — I739 Peripheral vascular disease, unspecified: Secondary | ICD-10-CM

## 2024-03-24 DIAGNOSIS — I70219 Atherosclerosis of native arteries of extremities with intermittent claudication, unspecified extremity: Secondary | ICD-10-CM

## 2024-04-13 ENCOUNTER — Ambulatory Visit

## 2024-04-13 DIAGNOSIS — I70219 Atherosclerosis of native arteries of extremities with intermittent claudication, unspecified extremity: Secondary | ICD-10-CM

## 2024-04-13 DIAGNOSIS — I739 Peripheral vascular disease, unspecified: Secondary | ICD-10-CM

## 2024-04-13 LAB — VAS US ABI WITH/WO TBI
Left ABI: 0.9
Right ABI: 1.08

## 2024-04-21 ENCOUNTER — Encounter

## 2024-04-27 ENCOUNTER — Encounter

## 2024-04-27 VITALS — BP 152/83 | HR 75 | Ht 64.0 in | Wt 165.0 lb

## 2024-04-27 DIAGNOSIS — L97521 Non-pressure chronic ulcer of other part of left foot limited to breakdown of skin: Secondary | ICD-10-CM | POA: Diagnosis not present

## 2024-04-27 DIAGNOSIS — I70219 Atherosclerosis of native arteries of extremities with intermittent claudication, unspecified extremity: Secondary | ICD-10-CM | POA: Diagnosis not present

## 2024-04-27 DIAGNOSIS — I739 Peripheral vascular disease, unspecified: Secondary | ICD-10-CM

## 2024-04-27 DIAGNOSIS — E11621 Type 2 diabetes mellitus with foot ulcer: Secondary | ICD-10-CM

## 2024-04-27 NOTE — Progress Notes (Signed)
 " Office Note     CC:  follow up Requesting Provider:  Maree Isles, MD  HPI: ASHYLA LUTH is a 63 y.o. (17-Dec-1960) female who presents status post angiogram with angioplasty of the left ATA, TP trunk, and peroneal artery due to a nonhealing left great toe wound.  This was performed by Dr. Gretta on 03/11/2024.  She reports the left great toe has healed since the procedure.  She denies any ongoing pain in her right groin access site.  She is taking Plavix  and statin daily however has not been taking a baby aspirin .  She is a former smoker.  She continues to work for [employer] and recreation.   Past Medical History:  Diagnosis Date   Diabetes mellitus without complication (HCC)    Hypertension    Peripheral vascular disease     Past Surgical History:  Procedure Laterality Date   ABDOMINAL AORTOGRAM N/A 03/11/2024   Procedure: ABDOMINAL AORTOGRAM;  Surgeon: Gretta Lonni PARAS, MD;  Location: [facility];  Service: Cardiovascular;  Laterality: N/A;   ABDOMINAL AORTOGRAM W/LOWER EXTREMITY Bilateral 05/11/2020   Procedure: ABDOMINAL AORTOGRAM W/LOWER EXTREMITY;  Surgeon: Gretta Lonni PARAS, MD;  Location: [facility];  Service: Cardiovascular;  Laterality: Bilateral;   ACHILLES TENDON REPAIR     AMPUTATION TOE Right 07/31/2020   Procedure: Partial fifth ray amputation right foot.;  Surgeon: Tobie Buckles, DPM;  Location: [facility];  Service: Podiatry;  Laterality: Right;   AMPUTATION TOE Right 10/18/2020   Procedure: AMPUTATION RIGHT THIRD TOE;  Surgeon: Tobie Buckles, DPM;  Location: [facility];  Service: Podiatry;  Laterality: Right;   ENDARTERECTOMY FEMORAL Right 05/19/2020   Procedure: RIGHT ENDARTERECTOMY COMMON FEMORAL WITH PROFUNDAPLASTY;  Surgeon: Gretta Lonni PARAS, MD;  Location: [facility];  Service: Vascular;  Laterality: Right;   EYE SURGERY Bilateral    FEMORAL-POPLITEAL BYPASS GRAFT Right 05/19/2020   Procedure: RIGHT COMMON FEMORAL TO ABOVE KNEE POPLITEAL BYPASS  GRAFTING WITH PROPATEN;  Surgeon: Gretta Lonni PARAS, MD;  Location: [facility] OR;  Service: Vascular;  Laterality: Right;   LOWER EXTREMITY ANGIOGRAPHY N/A 03/11/2024   Procedure: Lower Extremity Angiography;  Surgeon: Gretta Lonni PARAS, MD;  Location: [facility];  Service: Cardiovascular;  Laterality: N/A;   LOWER EXTREMITY INTERVENTION N/A 03/11/2024   Procedure: LOWER EXTREMITY INTERVENTION;  Surgeon: Gretta Lonni PARAS, MD;  Location: [facility];  Service: Cardiovascular;  Laterality: N/A;   METATARSAL HEAD EXCISION Left 06/13/2021   Procedure: 2ND METATARSAL HEAD RESECTION LEFT TOE;  Surgeon: Tobie Buckles, DPM;  Location: [facility];  Service: Podiatry;  Laterality: Left;   PATCH ANGIOPLASTY Right 05/19/2020   Procedure: PATCH ANGIOPLASTY;  Surgeon: Gretta Lonni PARAS, MD;  Location: [facility] OR;  Service: Vascular;  Laterality: Right;    Social History   Socioeconomic History   Marital status: Single    Spouse name: Not on file   Number of children: Not on file   Years of education: Not on file   Highest education level: Not on file  Occupational History   Not on file  Tobacco Use   Smoking status: Former    Current packs/day: 0.00    Average packs/day: 0.5 packs/day for 15.0 years (7.5 ttl pk-yrs)    Types: Cigarettes    Start date: 2003    Quit date: 2018    Years since quitting: 7.9   Smokeless tobacco: Never  Vaping Use   Vaping status: Never Used  Substance and Sexual Activity  Alcohol use: Never   Drug use: Never   Sexual activity: Not on file  Other Topics Concern   Not on file  Social History Narrative   Not on file   Social Drivers of Health   Tobacco Use: Medium Risk (04/27/2024)   Patient History    Smoking Tobacco Use: Former    Smokeless Tobacco Use: Never    Passive Exposure: Not on Actuary Strain: Not on file  Food Insecurity: Not on file  Transportation Needs: Not on file  Physical Activity: Not on file  Stress:  Not on file  Social Connections: Not on file  Intimate Partner Violence: Not At Risk (02/14/2022)   Received from [facility]   Epic    Within the last year, have you been afraid of your partner or ex-partner?: No    Within the last year, have you been humiliated or emotionally abused in other ways by your partner or ex-partner?: No    Within the last year, have you been kicked, hit, slapped, or otherwise physically hurt by your partner or ex-partner?: No    Within the last year, have you been raped or forced to have any kind of sexual activity by your partner or ex-partner?: No  Depression (PHQ2-9): Not on file  Alcohol Screen: Not on file  Housing: Not on file  Utilities: Not on file  Health Literacy: Low Risk (02/14/2022)   Received from [facility] Literacy    How often do you need to have someone help you when you read instructions, pamphlets, or other written material from your doctor or pharmacy?: Never    Family History  Problem Relation Age of Onset   Heart disease Father    Breast cancer Neg Hx     Current Outpatient Medications  Medication Sig Dispense Refill   albuterol  (VENTOLIN  HFA) 108 (90 Base) MCG/ACT inhaler Inhale 1 puff into the lungs 4 (four) times daily.     aspirin  EC 81 MG tablet Take 81 mg by mouth daily.     atorvastatin  (LIPITOR ) 80 MG tablet Take 1 tablet (80 mg total) by mouth daily. 30 tablet 11   clopidogrel  (PLAVIX ) 75 MG tablet Take 1 tablet (75 mg total) by mouth daily. 30 tablet 11   Empagliflozin-metFORMIN HCl 12.09-998 MG TABS Take 1 tablet by mouth daily.     gabapentin (NEURONTIN) 100 MG capsule Take 100 mg by mouth at bedtime.     glimepiride (AMARYL) 4 MG tablet Take 4 mg by mouth daily with breakfast.     lisinopril  (PRINIVIL ,ZESTRIL ) 20 MG tablet Take 40 mg by mouth daily.     MOUNJARO 5 MG/0.5ML Pen Inject 5 mg into the skin once a week.     omega-3 acid ethyl esters (LOVAZA) 1 g capsule Take 2 g by mouth daily.      ondansetron  (ZOFRAN ) 4 MG tablet Take 8 mg by mouth 3 (three) times daily as needed for vomiting or nausea.     rOPINIRole (REQUIP) 1 MG tablet Take 1 mg by mouth at bedtime.     SF 5000 PLUS 1.1 % CREA dental cream Place 1 Application onto teeth at bedtime.     SSD 1 % cream Apply 1 application topically 2 (two) times daily.     INVOKAMET [id number] MG TABS Take 1 tablet by mouth 2 (two) times daily. (Patient not taking: Reported on 04/27/2024)  4   No current facility-administered medications for this visit.  Allergies[1]   REVIEW OF SYSTEMS:  Negative unless noted in HPI [X]  denotes positive finding, [ ]  denotes negative finding Cardiac  Comments:  Chest pain or chest pressure:    Shortness of breath upon exertion:    Short of breath when lying flat:    Irregular heart rhythm:        Vascular    Pain in calf, thigh, or hip brought on by ambulation:    Pain in feet at night that wakes you up from your sleep:     Blood clot in your veins:    Leg swelling:         Pulmonary    Oxygen at home:    Productive cough:     Wheezing:         Neurologic    Sudden weakness in arms or legs:     Sudden numbness in arms or legs:     Sudden onset of difficulty speaking or slurred speech:    Temporary loss of vision in one eye:     Problems with dizziness:         Gastrointestinal    Blood in stool:     Vomited blood:         Genitourinary    Burning when urinating:     Blood in urine:        Psychiatric    Major depression:         Hematologic    Bleeding problems:    Problems with blood clotting too easily:        Skin    Rashes or ulcers:        Constitutional    Fever or chills:      PHYSICAL EXAMINATION:  Vitals:   04/27/24 0944  BP: (!) 152/83  Pulse: 75  Weight: 165 lb (74.8 kg)  Height: 5' 4 (1.626 m)    General:  WDWN in NAD; vital signs documented above Gait: Not observed HENT: WNL, normocephalic Pulmonary: normal non-labored breathing Cardiac:  regular HR Abdomen: soft, NT, no masses Skin: without rashes Vascular Exam/Pulses: possible L ATA pulse 1+ Extremities: without ischemic changes, without Gangrene , without cellulitis; without open wounds;  Musculoskeletal: no muscle wasting or atrophy  Neurologic: A&O X 3 Psychiatric:  The pt has Normal affect.   Non-Invasive Vascular Imaging:   Duplex demonstrates widely patent TP trunk, and anterior tibial artery.  The peroneal is not visualized  ABI/TBIToday's ABIToday's TBIPrevious ABIPrevious TBI  +-------+-----------+-----------+------------+------------+  Right 1.08       0.57       1.1         0.8           +-------+-----------+-----------+------------+------------+  Left  0.9        0.52       1.05        0.85          +-------+-----------+-----------+------------+------------+      ASSESSMENT/PLAN:: 63 y.o. female status post angiography with balloon angioplasty of the left TP trunk, AT, and peroneal artery  Subjectively, Ms. Mccombs is doing well since angiogram.  She states her left great toe has since healed.  The right groin is without hematoma or ongoing discomfort.  Duplex demonstrates widely patent AT and TP trunk.  The peroneal artery was not well-visualized.  On exam her left foot is well-perfused without any new wounds.  She is on Plavix  and statin daily.  I asked her to also take an 81 mg  aspirin  daily.  She also has a history of a right femoral to above-the-knee popliteal bypass.  We will check a left leg arterial duplex, right leg bypass duplex, and ABI in 6 months.   Donnice Sender, PA-C [facility] [phone]     [1]  Allergies Allergen Reactions   Hydromet [Hydrocodone Bit-Homatrop Mbr] Nausea And Vomiting   Penicillins Swelling    Reaction: 2 years   Codeine Nausea And Vomiting   "

## 2024-04-30 ENCOUNTER — Other Ambulatory Visit: Payer: Self-pay | Admitting: Internal Medicine

## 2024-04-30 DIAGNOSIS — Z1231 Encounter for screening mammogram for malignant neoplasm of breast: Secondary | ICD-10-CM

## 2024-05-04 ENCOUNTER — Ambulatory Visit
Admission: RE | Admit: 2024-05-04 | Discharge: 2024-05-04 | Disposition: A | Discharge status: Home / Self Care | Source: Ambulatory Visit | Attending: Internal Medicine | Admitting: Internal Medicine

## 2024-05-04 DIAGNOSIS — Z1231 Encounter for screening mammogram for malignant neoplasm of breast: Secondary | ICD-10-CM

## 2024-11-30 ENCOUNTER — Encounter

## 2024-11-30 ENCOUNTER — Other Ambulatory Visit

## 2024-11-30 ENCOUNTER — Ambulatory Visit
